# Patient Record
Sex: Female | Born: 1960
Health system: Southern US, Community
[De-identification: ages and names within clinical notes are randomized; demographics above are authoritative.]

## PROBLEM LIST (undated history)

## (undated) DIAGNOSIS — C50919 Malignant neoplasm of unspecified site of unspecified female breast: Secondary | ICD-10-CM

## (undated) DIAGNOSIS — C801 Malignant (primary) neoplasm, unspecified: Secondary | ICD-10-CM

## (undated) DIAGNOSIS — M7060 Trochanteric bursitis, unspecified hip: Secondary | ICD-10-CM

## (undated) DIAGNOSIS — E663 Overweight: Secondary | ICD-10-CM

## (undated) DIAGNOSIS — I10 Essential (primary) hypertension: Secondary | ICD-10-CM

## (undated) DIAGNOSIS — T7840XA Allergy, unspecified, initial encounter: Secondary | ICD-10-CM

## (undated) DIAGNOSIS — E785 Hyperlipidemia, unspecified: Secondary | ICD-10-CM

## (undated) DIAGNOSIS — M79601 Pain in right arm: Secondary | ICD-10-CM

## (undated) DIAGNOSIS — S39012A Strain of muscle, fascia and tendon of lower back, initial encounter: Secondary | ICD-10-CM

## (undated) DIAGNOSIS — M170 Bilateral primary osteoarthritis of knee: Secondary | ICD-10-CM

## (undated) DIAGNOSIS — Z923 Personal history of irradiation: Secondary | ICD-10-CM

## (undated) DIAGNOSIS — IMO0002 Reserved for concepts with insufficient information to code with codable children: Secondary | ICD-10-CM

## (undated) HISTORY — DX: Trochanteric bursitis, unspecified hip: M70.60

## (undated) HISTORY — DX: Bilateral primary osteoarthritis of knee: M17.0

## (undated) HISTORY — PX: ABDOMINAL HYSTERECTOMY: SHX81

## (undated) HISTORY — DX: Allergy, unspecified, initial encounter: T78.40XA

## (undated) HISTORY — DX: Malignant (primary) neoplasm, unspecified: C80.1

## (undated) HISTORY — DX: Strain of muscle, fascia and tendon of lower back, initial encounter: S39.012A

## (undated) HISTORY — DX: Hyperlipidemia, unspecified: E78.5

## (undated) HISTORY — DX: Essential (primary) hypertension: I10

## (undated) HISTORY — DX: Overweight: E66.3

## (undated) HISTORY — PX: OTHER SURGICAL HISTORY: SHX169

## (undated) HISTORY — DX: Pain in right arm: M79.601

## (undated) HISTORY — PX: APPENDECTOMY: SHX54

---

## 2001-08-10 ENCOUNTER — Encounter: Payer: Self-pay | Admitting: Family Medicine

## 2001-08-10 ENCOUNTER — Ambulatory Visit (HOSPITAL_COMMUNITY): Admission: RE | Admit: 2001-08-10 | Discharge: 2001-08-10 | Payer: Self-pay | Admitting: Family Medicine

## 2001-09-22 ENCOUNTER — Other Ambulatory Visit: Admission: RE | Admit: 2001-09-22 | Discharge: 2001-09-22 | Payer: Self-pay | Admitting: Family Medicine

## 2002-07-25 ENCOUNTER — Ambulatory Visit (HOSPITAL_COMMUNITY): Admission: RE | Admit: 2002-07-25 | Discharge: 2002-07-25 | Payer: Self-pay | Admitting: Family Medicine

## 2002-07-25 ENCOUNTER — Encounter: Payer: Self-pay | Admitting: Family Medicine

## 2002-08-21 ENCOUNTER — Encounter: Payer: Self-pay | Admitting: Family Medicine

## 2002-08-21 ENCOUNTER — Ambulatory Visit (HOSPITAL_COMMUNITY): Admission: RE | Admit: 2002-08-21 | Discharge: 2002-08-21 | Payer: Self-pay | Admitting: Nurse Practitioner

## 2002-09-05 ENCOUNTER — Other Ambulatory Visit: Admission: RE | Admit: 2002-09-05 | Discharge: 2002-09-05 | Payer: Self-pay | Admitting: *Deleted

## 2003-05-11 ENCOUNTER — Encounter: Payer: Self-pay | Admitting: Emergency Medicine

## 2003-05-11 ENCOUNTER — Emergency Department (HOSPITAL_COMMUNITY): Admission: EM | Admit: 2003-05-11 | Discharge: 2003-05-11 | Payer: Self-pay | Admitting: Emergency Medicine

## 2003-05-13 ENCOUNTER — Encounter: Payer: Self-pay | Admitting: General Surgery

## 2003-05-13 ENCOUNTER — Ambulatory Visit (HOSPITAL_COMMUNITY): Admission: RE | Admit: 2003-05-13 | Discharge: 2003-05-13 | Payer: Self-pay | Admitting: Family Medicine

## 2003-05-13 ENCOUNTER — Encounter: Payer: Self-pay | Admitting: Family Medicine

## 2003-05-13 ENCOUNTER — Inpatient Hospital Stay (HOSPITAL_COMMUNITY): Admission: AD | Admit: 2003-05-13 | Discharge: 2003-05-16 | Payer: Self-pay | Admitting: General Surgery

## 2003-09-11 ENCOUNTER — Encounter: Payer: Self-pay | Admitting: Family Medicine

## 2003-09-11 ENCOUNTER — Ambulatory Visit (HOSPITAL_COMMUNITY): Admission: RE | Admit: 2003-09-11 | Discharge: 2003-09-11 | Payer: Self-pay | Admitting: Family Medicine

## 2005-02-10 ENCOUNTER — Ambulatory Visit (HOSPITAL_COMMUNITY): Admission: RE | Admit: 2005-02-10 | Discharge: 2005-02-10 | Payer: Self-pay | Admitting: Family Medicine

## 2005-03-03 ENCOUNTER — Ambulatory Visit: Payer: Self-pay | Admitting: Family Medicine

## 2005-03-05 ENCOUNTER — Ambulatory Visit (HOSPITAL_COMMUNITY): Admission: RE | Admit: 2005-03-05 | Discharge: 2005-03-05 | Payer: Self-pay | Admitting: Family Medicine

## 2005-06-01 ENCOUNTER — Encounter (INDEPENDENT_AMBULATORY_CARE_PROVIDER_SITE_OTHER): Payer: Self-pay | Admitting: *Deleted

## 2005-06-01 LAB — CONVERTED CEMR LAB: TSH: 1.734 microintl units/mL

## 2005-06-17 ENCOUNTER — Encounter (INDEPENDENT_AMBULATORY_CARE_PROVIDER_SITE_OTHER): Payer: Self-pay | Admitting: *Deleted

## 2005-08-10 ENCOUNTER — Ambulatory Visit: Payer: Self-pay | Admitting: Family Medicine

## 2005-12-17 ENCOUNTER — Encounter (INDEPENDENT_AMBULATORY_CARE_PROVIDER_SITE_OTHER): Payer: Self-pay | Admitting: *Deleted

## 2006-08-04 ENCOUNTER — Ambulatory Visit: Payer: Self-pay | Admitting: Family Medicine

## 2006-09-15 ENCOUNTER — Ambulatory Visit: Payer: Self-pay | Admitting: Family Medicine

## 2006-12-27 DIAGNOSIS — C50919 Malignant neoplasm of unspecified site of unspecified female breast: Secondary | ICD-10-CM

## 2006-12-27 DIAGNOSIS — IMO0001 Reserved for inherently not codable concepts without codable children: Secondary | ICD-10-CM

## 2006-12-27 DIAGNOSIS — Z923 Personal history of irradiation: Secondary | ICD-10-CM

## 2006-12-27 HISTORY — DX: Malignant neoplasm of unspecified site of unspecified female breast: C50.919

## 2006-12-27 HISTORY — PX: BREAST LUMPECTOMY: SHX2

## 2006-12-27 HISTORY — DX: Personal history of irradiation: Z92.3

## 2006-12-27 HISTORY — DX: Reserved for inherently not codable concepts without codable children: IMO0001

## 2006-12-27 HISTORY — PX: BREAST SURGERY: SHX581

## 2007-04-19 ENCOUNTER — Encounter (INDEPENDENT_AMBULATORY_CARE_PROVIDER_SITE_OTHER): Payer: Self-pay | Admitting: *Deleted

## 2007-04-19 ENCOUNTER — Other Ambulatory Visit: Admission: RE | Admit: 2007-04-19 | Discharge: 2007-04-19 | Payer: Self-pay | Admitting: Family Medicine

## 2007-04-19 ENCOUNTER — Ambulatory Visit: Payer: Self-pay | Admitting: Family Medicine

## 2007-04-19 LAB — CONVERTED CEMR LAB: Pap Smear: NORMAL

## 2007-04-20 ENCOUNTER — Ambulatory Visit (HOSPITAL_COMMUNITY): Admission: RE | Admit: 2007-04-20 | Discharge: 2007-04-20 | Payer: Self-pay | Admitting: Family Medicine

## 2007-05-23 ENCOUNTER — Ambulatory Visit: Payer: Self-pay

## 2007-05-26 ENCOUNTER — Ambulatory Visit: Payer: Self-pay

## 2007-07-19 ENCOUNTER — Ambulatory Visit: Payer: Self-pay | Admitting: General Surgery

## 2007-07-25 ENCOUNTER — Ambulatory Visit: Payer: Self-pay | Admitting: General Surgery

## 2007-07-28 ENCOUNTER — Ambulatory Visit: Payer: Self-pay | Admitting: Radiation Oncology

## 2007-07-31 ENCOUNTER — Ambulatory Visit: Payer: Self-pay | Admitting: General Surgery

## 2007-08-17 ENCOUNTER — Ambulatory Visit: Payer: Self-pay | Admitting: Radiation Oncology

## 2007-08-28 ENCOUNTER — Ambulatory Visit: Payer: Self-pay | Admitting: Radiation Oncology

## 2007-08-28 ENCOUNTER — Ambulatory Visit: Payer: Self-pay | Admitting: Oncology

## 2007-09-27 ENCOUNTER — Ambulatory Visit: Payer: Self-pay | Admitting: Radiation Oncology

## 2007-09-27 ENCOUNTER — Ambulatory Visit: Payer: Self-pay | Admitting: Oncology

## 2007-10-28 ENCOUNTER — Ambulatory Visit: Payer: Self-pay | Admitting: Oncology

## 2007-10-28 ENCOUNTER — Ambulatory Visit: Payer: Self-pay | Admitting: Radiation Oncology

## 2007-10-31 ENCOUNTER — Ambulatory Visit: Payer: Self-pay | Admitting: Family Medicine

## 2007-11-27 ENCOUNTER — Ambulatory Visit: Payer: Self-pay | Admitting: Oncology

## 2007-11-28 ENCOUNTER — Ambulatory Visit: Payer: Self-pay | Admitting: Oncology

## 2007-12-28 ENCOUNTER — Ambulatory Visit: Payer: Self-pay | Admitting: Oncology

## 2008-01-05 ENCOUNTER — Encounter: Payer: Self-pay | Admitting: *Deleted

## 2008-01-05 DIAGNOSIS — I10 Essential (primary) hypertension: Secondary | ICD-10-CM | POA: Insufficient documentation

## 2008-02-25 ENCOUNTER — Ambulatory Visit: Payer: Self-pay | Admitting: Oncology

## 2008-03-27 ENCOUNTER — Ambulatory Visit: Payer: Self-pay | Admitting: Oncology

## 2008-04-04 ENCOUNTER — Other Ambulatory Visit: Admission: RE | Admit: 2008-04-04 | Discharge: 2008-04-04 | Payer: Self-pay | Admitting: Family Medicine

## 2008-04-04 ENCOUNTER — Ambulatory Visit: Payer: Self-pay | Admitting: Family Medicine

## 2008-04-04 ENCOUNTER — Encounter: Payer: Self-pay | Admitting: Family Medicine

## 2008-04-05 ENCOUNTER — Encounter: Payer: Self-pay | Admitting: Family Medicine

## 2008-04-05 LAB — CONVERTED CEMR LAB
Basophils Absolute: 0 10*3/uL (ref 0.0–0.1)
CO2: 22 meq/L (ref 19–32)
Calcium: 9.2 mg/dL (ref 8.4–10.5)
Cholesterol: 180 mg/dL (ref 0–200)
Eosinophils Relative: 1 % (ref 0–5)
HCT: 36.4 % (ref 36.0–46.0)
HDL: 50 mg/dL (ref >39)
LDL Cholesterol: 113 mg/dL — ABNORMAL HIGH (ref 0–99)
Lymphocytes Relative: 21 % (ref 12–46)
Platelets: 342 10*3/uL (ref 150–400)
RDW: 12.8 % (ref 11.5–15.5)
Sodium: 142 meq/L (ref 135–145)
Total CHOL/HDL Ratio: 3.6
VLDL: 17 mg/dL (ref 0–40)
WBC: 6.5 10*3/uL (ref 4.0–10.5)

## 2008-04-12 DIAGNOSIS — M1710 Unilateral primary osteoarthritis, unspecified knee: Secondary | ICD-10-CM

## 2008-04-12 DIAGNOSIS — IMO0002 Reserved for concepts with insufficient information to code with codable children: Secondary | ICD-10-CM | POA: Insufficient documentation

## 2008-04-12 DIAGNOSIS — D0511 Intraductal carcinoma in situ of right breast: Secondary | ICD-10-CM | POA: Insufficient documentation

## 2008-04-24 ENCOUNTER — Ambulatory Visit: Payer: Self-pay | Admitting: Oncology

## 2008-04-26 ENCOUNTER — Ambulatory Visit: Payer: Self-pay | Admitting: Oncology

## 2008-05-27 ENCOUNTER — Ambulatory Visit: Payer: Self-pay | Admitting: Oncology

## 2008-06-05 ENCOUNTER — Emergency Department (HOSPITAL_COMMUNITY): Admission: EM | Admit: 2008-06-05 | Discharge: 2008-06-06 | Payer: Self-pay | Admitting: Emergency Medicine

## 2008-06-06 ENCOUNTER — Ambulatory Visit: Payer: Self-pay | Admitting: Family Medicine

## 2008-06-07 ENCOUNTER — Encounter: Payer: Self-pay | Admitting: Family Medicine

## 2008-06-17 ENCOUNTER — Ambulatory Visit: Payer: Self-pay | Admitting: Oncology

## 2008-06-26 ENCOUNTER — Ambulatory Visit: Payer: Self-pay | Admitting: Oncology

## 2008-07-27 ENCOUNTER — Ambulatory Visit: Payer: Self-pay | Admitting: Oncology

## 2008-08-22 ENCOUNTER — Encounter: Payer: Self-pay | Admitting: Family Medicine

## 2008-08-27 ENCOUNTER — Ambulatory Visit: Payer: Self-pay | Admitting: Oncology

## 2008-09-05 ENCOUNTER — Encounter: Payer: Self-pay | Admitting: Family Medicine

## 2008-09-26 ENCOUNTER — Ambulatory Visit: Payer: Self-pay | Admitting: Oncology

## 2008-10-27 ENCOUNTER — Ambulatory Visit: Payer: Self-pay | Admitting: Oncology

## 2008-11-26 ENCOUNTER — Ambulatory Visit: Payer: Self-pay | Admitting: Oncology

## 2008-12-11 ENCOUNTER — Ambulatory Visit: Payer: Self-pay | Admitting: Oncology

## 2008-12-16 ENCOUNTER — Ambulatory Visit: Payer: Self-pay | Admitting: Oncology

## 2008-12-27 ENCOUNTER — Ambulatory Visit: Payer: Self-pay | Admitting: Oncology

## 2009-01-02 ENCOUNTER — Telehealth: Payer: Self-pay | Admitting: Family Medicine

## 2009-01-30 ENCOUNTER — Ambulatory Visit: Payer: Self-pay | Admitting: Family Medicine

## 2009-01-30 DIAGNOSIS — S335XXA Sprain of ligaments of lumbar spine, initial encounter: Secondary | ICD-10-CM | POA: Insufficient documentation

## 2009-01-30 DIAGNOSIS — E785 Hyperlipidemia, unspecified: Secondary | ICD-10-CM | POA: Insufficient documentation

## 2009-01-30 DIAGNOSIS — J309 Allergic rhinitis, unspecified: Secondary | ICD-10-CM | POA: Insufficient documentation

## 2009-03-31 ENCOUNTER — Encounter: Payer: Self-pay | Admitting: Family Medicine

## 2009-03-31 LAB — CONVERTED CEMR LAB
BUN: 14 mg/dL (ref 6–23)
Basophils Absolute: 0 10*3/uL (ref 0.0–0.1)
Basophils Relative: 1 % (ref 0–1)
CO2: 27 meq/L (ref 19–32)
Cholesterol: 200 mg/dL (ref 0–200)
Eosinophils Relative: 2 % (ref 0–5)
Glucose, Bld: 80 mg/dL (ref 70–99)
HCT: 36.3 % (ref 36.0–46.0)
HDL: 75 mg/dL (ref >39)
Hemoglobin: 11.4 g/dL — ABNORMAL LOW (ref 12.0–15.0)
Lymphocytes Relative: 26 % (ref 12–46)
MCHC: 31.4 g/dL (ref 30.0–36.0)
Monocytes Absolute: 0.5 10*3/uL (ref 0.1–1.0)
Monocytes Relative: 8 % (ref 3–12)
Potassium: 4.1 meq/L (ref 3.5–5.3)
RBC: 4.17 M/uL (ref 3.87–5.11)
RDW: 13.1 % (ref 11.5–15.5)
Sodium: 143 meq/L (ref 135–145)
Total CHOL/HDL Ratio: 2.7
WBC: 5.5 10*3/uL (ref 4.0–10.5)

## 2009-04-15 ENCOUNTER — Encounter: Payer: Self-pay | Admitting: Family Medicine

## 2009-04-15 ENCOUNTER — Ambulatory Visit: Payer: Self-pay | Admitting: Family Medicine

## 2009-04-15 ENCOUNTER — Other Ambulatory Visit: Admission: RE | Admit: 2009-04-15 | Discharge: 2009-04-15 | Payer: Self-pay | Admitting: Family Medicine

## 2009-04-15 LAB — CONVERTED CEMR LAB: OCCULT 1: NEGATIVE

## 2009-04-20 DIAGNOSIS — E663 Overweight: Secondary | ICD-10-CM | POA: Insufficient documentation

## 2009-04-21 ENCOUNTER — Encounter: Payer: Self-pay | Admitting: Family Medicine

## 2009-05-27 ENCOUNTER — Ambulatory Visit: Payer: Self-pay | Admitting: Oncology

## 2009-06-04 ENCOUNTER — Telehealth: Payer: Self-pay | Admitting: Family Medicine

## 2009-06-26 ENCOUNTER — Ambulatory Visit: Payer: Self-pay | Admitting: Oncology

## 2009-08-27 ENCOUNTER — Ambulatory Visit: Payer: Self-pay | Admitting: Oncology

## 2009-09-16 ENCOUNTER — Ambulatory Visit: Payer: Self-pay | Admitting: Oncology

## 2009-09-26 ENCOUNTER — Ambulatory Visit: Payer: Self-pay | Admitting: Oncology

## 2009-10-27 ENCOUNTER — Ambulatory Visit: Payer: Self-pay | Admitting: Oncology

## 2009-11-26 ENCOUNTER — Ambulatory Visit: Payer: Self-pay | Admitting: Oncology

## 2009-12-05 ENCOUNTER — Telehealth: Payer: Self-pay | Admitting: Family Medicine

## 2009-12-16 ENCOUNTER — Ambulatory Visit: Payer: Self-pay | Admitting: Oncology

## 2009-12-27 ENCOUNTER — Ambulatory Visit: Payer: Self-pay | Admitting: Oncology

## 2009-12-28 ENCOUNTER — Encounter: Payer: Self-pay | Admitting: Family Medicine

## 2010-01-27 ENCOUNTER — Ambulatory Visit: Payer: Self-pay | Admitting: Oncology

## 2010-02-02 ENCOUNTER — Ambulatory Visit: Payer: Self-pay | Admitting: Family Medicine

## 2010-02-02 ENCOUNTER — Encounter: Payer: Self-pay | Admitting: Orthopedic Surgery

## 2010-02-02 ENCOUNTER — Ambulatory Visit (HOSPITAL_COMMUNITY): Admission: RE | Admit: 2010-02-02 | Discharge: 2010-02-02 | Payer: Self-pay | Admitting: Family Medicine

## 2010-02-02 DIAGNOSIS — M25529 Pain in unspecified elbow: Secondary | ICD-10-CM | POA: Insufficient documentation

## 2010-02-02 DIAGNOSIS — B029 Zoster without complications: Secondary | ICD-10-CM | POA: Insufficient documentation

## 2010-02-02 DIAGNOSIS — E559 Vitamin D deficiency, unspecified: Secondary | ICD-10-CM | POA: Insufficient documentation

## 2010-02-02 DIAGNOSIS — L259 Unspecified contact dermatitis, unspecified cause: Secondary | ICD-10-CM | POA: Insufficient documentation

## 2010-02-02 DIAGNOSIS — R5383 Other fatigue: Secondary | ICD-10-CM | POA: Insufficient documentation

## 2010-02-02 LAB — CONVERTED CEMR LAB
BUN: 17 mg/dL (ref 6–23)
Basophils Absolute: 0 10*3/uL (ref 0.0–0.1)
Basophils Relative: 1 % (ref 0–1)
CO2: 28 meq/L (ref 19–32)
Calcium: 10.1 mg/dL (ref 8.4–10.5)
Cholesterol: 216 mg/dL — ABNORMAL HIGH (ref 0–200)
Eosinophils Relative: 3 % (ref 0–5)
Glucose, Bld: 84 mg/dL (ref 70–99)
HCT: 37.8 % (ref 36.0–46.0)
Hemoglobin: 11.4 g/dL — ABNORMAL LOW (ref 12.0–15.0)
MCHC: 30.2 g/dL (ref 30.0–36.0)
MCV: 87.9 fL (ref 78.0–100.0)
Monocytes Absolute: 0.5 10*3/uL (ref 0.1–1.0)
Monocytes Relative: 9 % (ref 3–12)
RDW: 13.1 % (ref 11.5–15.5)
TSH: 1.591 microintl units/mL (ref 0.350–4.500)
VLDL: 17 mg/dL (ref 0–40)
Varicella-Zoster Ab, IgM: <0.91 (ref <0.91)
Vit D, 25-Hydroxy: 41 ng/mL (ref 30–89)

## 2010-02-04 ENCOUNTER — Encounter: Payer: Self-pay | Admitting: Family Medicine

## 2010-02-11 ENCOUNTER — Telehealth: Payer: Self-pay | Admitting: Family Medicine

## 2010-02-12 ENCOUNTER — Telehealth: Payer: Self-pay | Admitting: Family Medicine

## 2010-02-17 ENCOUNTER — Ambulatory Visit: Payer: Self-pay | Admitting: Orthopedic Surgery

## 2010-02-17 DIAGNOSIS — M79609 Pain in unspecified limb: Secondary | ICD-10-CM | POA: Insufficient documentation

## 2010-02-17 DIAGNOSIS — M76899 Other specified enthesopathies of unspecified lower limb, excluding foot: Secondary | ICD-10-CM | POA: Insufficient documentation

## 2010-02-18 ENCOUNTER — Encounter: Payer: Self-pay | Admitting: Orthopedic Surgery

## 2010-02-18 ENCOUNTER — Telehealth: Payer: Self-pay | Admitting: Orthopedic Surgery

## 2010-02-23 ENCOUNTER — Telehealth: Payer: Self-pay | Admitting: Orthopedic Surgery

## 2010-02-27 ENCOUNTER — Encounter: Payer: Self-pay | Admitting: Family Medicine

## 2010-05-27 ENCOUNTER — Ambulatory Visit: Payer: Self-pay | Admitting: Oncology

## 2010-06-17 ENCOUNTER — Ambulatory Visit: Payer: Self-pay | Admitting: Oncology

## 2010-06-17 ENCOUNTER — Encounter: Payer: Self-pay | Admitting: Family Medicine

## 2010-06-26 ENCOUNTER — Ambulatory Visit: Payer: Self-pay | Admitting: Oncology

## 2010-07-20 ENCOUNTER — Ambulatory Visit: Payer: Self-pay | Admitting: Family Medicine

## 2010-07-26 DIAGNOSIS — L989 Disorder of the skin and subcutaneous tissue, unspecified: Secondary | ICD-10-CM | POA: Insufficient documentation

## 2011-01-17 ENCOUNTER — Encounter: Payer: Self-pay | Admitting: Family Medicine

## 2011-01-20 ENCOUNTER — Ambulatory Visit: Payer: Self-pay | Admitting: Oncology

## 2011-01-20 ENCOUNTER — Encounter: Payer: Self-pay | Admitting: Family Medicine

## 2011-01-25 ENCOUNTER — Other Ambulatory Visit: Payer: Self-pay | Admitting: Family Medicine

## 2011-01-25 ENCOUNTER — Other Ambulatory Visit (HOSPITAL_COMMUNITY)
Admission: RE | Admit: 2011-01-25 | Discharge: 2011-01-25 | Disposition: A | Discharge status: Home / Self Care | Payer: BC Managed Care – PPO | Source: Ambulatory Visit | Attending: Family Medicine | Admitting: Family Medicine

## 2011-01-25 ENCOUNTER — Ambulatory Visit
Admission: RE | Admit: 2011-01-25 | Discharge: 2011-01-25 | Payer: Self-pay | Source: Home / Self Care | Attending: Family Medicine | Admitting: Family Medicine

## 2011-01-25 DIAGNOSIS — Z01419 Encounter for gynecological examination (general) (routine) without abnormal findings: Secondary | ICD-10-CM | POA: Insufficient documentation

## 2011-01-25 LAB — CONVERTED CEMR LAB: OCCULT 1: NEGATIVE

## 2011-01-26 ENCOUNTER — Encounter: Payer: Self-pay | Admitting: Family Medicine

## 2011-01-26 NOTE — Progress Notes (Signed)
Summary: [facility]  [facility]   Imported By: Lind Guest 01/07/2010 14:02:41  _____________________________________________________________________  External Attachment:    Type:   Image     Comment:   External Document

## 2011-01-26 NOTE — Progress Notes (Signed)
Summary: call  Phone Note Call from Patient   Summary of Call: pt called and saw Amber yesterday. and Amber told pt she needed to speak with dr. Lodema Hong about meds. [phone] Initial call taken by: Rudene Anda,  February 11, 2010 9:50 AM  Follow-up for Phone Call        pls send a note to Amber asking what does she need changed with this pt's medication UI will write it pls fax and let them know on the way. let pt know I am waiting to hear  Follow-up by: Syliva Overman MD,  February 11, 2010 11:57 AM  Additional Follow-up for Phone Call Additional follow up Details #1::        patient aware Additional Follow-up by: Adella Hare LPN,  February 11, 2010 4:25 PM

## 2011-01-26 NOTE — Assessment & Plan Note (Signed)
Summary: OV   Vital Signs:  Patient profile:   50 year old female Menstrual status:  hysterectomy Height:      64.5 inches Weight:      177.75 pounds BMI:     30.15 O2 Sat:      98 % Pulse rate:   91 / minute Pulse rhythm:   regular Resp:     16 per minute BP sitting:   122 / 82  Vitals Entered By: Everitt Amber (February 02, 2010 7:59 AM)  Nutrition Counseling: Patient's BMI is greater than 25 and therefore counseled on weight management options. CC: Follow up chronic problems   CC:  Follow up chronic problems.  History of Present Illness: Reports  that she has been doiing   well. Denies recent fever or chills. Denies sinus pressure, nasal congestion , ear pain or sore throat. Denies chest congestion, or cough productive of sputum. Denies chest pain, palpitations, PND, orthopnea or leg swelling. Denies abdominal pain, nausea, vomitting, diarrhea or constipation. Denies change in bowel movements or bloody stool. Denies dysuria , frequency, incontinence or hesitancy. Reports joint pain, and reduced mobility. Denies headaches, vertigo, seizures. Denies depression, anxiety or insomnia. reports   rash, lesions, and itch.     Current Medications (verified): 1)  Nexium 40 Mg  Cpdr (Esomeprazole Magnesium) .... One Cap By Mouth Once Daily 2)  Lotensin Hct 20-12.5 Mg Tabs (Benazepril-Hydrochlorothiazide) .... Take 1 Tablet By Mouth Once A Day 3)  Femara 2.5 Mg Tabs (Letrozole) .... Take 1 Tablet By Mouth Once A Day 4)  Singulair 10 Mg Tabs (Montelukast Sodium) .... Take 1 Tablet By Mouth Once A Day 5)  Tandem 162-115.2 Mg Caps (Ferrous Fum-Iron Polysacch) .... Take 1 Tablet By Mouth Two Times A Day  Allergies (verified): No Known Drug Allergies  Review of Systems      See HPI Eyes:  Denies blurring and discharge. MS:  Complains of joint pain; right elbow pain progressing over the past 4 months, no numbness or weakness of extremity, but at times so painful she has to lift  the arm. Derm:  Complains of itching and rash; burning stinging rash on left side of neck x 1 nmonth. Endo:  Denies cold intolerance, excessive hunger, excessive thirst, excessive urination, heat intolerance, polyuria, and weight change. Heme:  Denies abnormal bruising and bleeding. Allergy:  Denies hives or rash and sneezing.  Physical Exam  General:  Well-developed,well-nourished,in no acute distress; alert,appropriate and cooperative throughout examination HEENT: No facial asymmetry,  EOMI, No sinus tenderness, TM's Clear, oropharynx  pink and moist.   Chest: Clear to auscultation bilaterally.  CVS: S1, S2, No murmurs, No S3.   Abd: Soft, Nontender.  MS: Adequate ROM spine, hips, shoulders and knees. Reduced mobility in right elbow which is tender Ext: No edema.   CNS: CN 2-12 intact, power tone and sensation normal throughout.   Skin: Intact,hyperpigmeented lesions with vesicles on left side of neck Psych: Good eye contact, normal affect.  Memory intact, not anxious or depressed appearing.    Impression & Recommendations:  Problem # 1:  OVERWEIGHT (ICD-278.02) Assessment Deteriorated  Ht: 64.5 (02/02/2010)   Wt: 177.75 (02/02/2010)   BMI: 30.15 (02/02/2010)  Problem # 2:  HYPERTENSION (ICD-401.9) Assessment: Unchanged  Her updated medication list for this problem includes:    Lotensin Hct 20-12.5 Mg Tabs (Benazepril-hydrochlorothiazide) .Marland Kitchen... Take 1 tablet by mouth once a day  BP today: 122/82 Prior BP: 120/80 (01/30/2009)  Labs Reviewed: K+: 4.1 (03/31/2009) Creat: :  0.75 (03/31/2009)   Chol: 200 (03/31/2009)   HDL: 75 (03/31/2009)   LDL: 110 (03/31/2009)   TG: 73 (03/31/2009)  Orders: T-Basic Metabolic Panel [phone])  Problem # 3:  DERMATITIS (ICD-692.9)  appears to be zoster  Her updated medication list for this problem includes:    Prednisone (pak) 5 Mg Tabs (Prednisone) ..... Use as directed  Orders: Dermatology Referral (Derma)  Complete  Medication List: 1)  Nexium 40 Mg Cpdr (Esomeprazole magnesium) .... One cap by mouth once daily 2)  Lotensin Hct 20-12.5 Mg Tabs (Benazepril-hydrochlorothiazide) .... Take 1 tablet by mouth once a day 3)  Femara 2.5 Mg Tabs (Letrozole) .... Take 1 tablet by mouth once a day 4)  Singulair 10 Mg Tabs (Montelukast sodium) .... Take 1 tablet by mouth once a day 5)  Tandem 162-115.2 Mg Caps (Ferrous fum-iron polysacch) .... Take 1 tablet by mouth two times a day 6)  Acyclovir 800 Mg Tabs (Acyclovir) .... One tablet 5 times daily 7)  Prednisone (pak) 5 Mg Tabs (Prednisone) .... Use as directed 8)  Ibuprofen 800 Mg Tabs (Ibuprofen) .... Take 1 tablet by mouth three times a day  Other Orders: Ophthalmology Referral (Ophthalmology) Radiology other (Radiology Other) Orthopedic Referral (Ortho) T-Lipid Profile [id number]) T-TSH [phone]) T-CBC w/Diff [phone]) T-Vitamin D (25-Hydroxy) [phone]) T-Varicella-Zoster Antibody IgM ([id number])  Patient Instructions: 1)  F/u in 5.5 months. 2)  It is important that you exercise regularly at least 20 minutes 5 times a week. If you develop chest pain, have severe difficulty breathing, or feel very tired , stop exercising immediately and seek medical attention. 3)  You need to lose weight. Consider a lower calorie diet and regular exercise. Goal is 10 pounds or more. 4)  BMP prior to visit, ICD-9: 5)  Lipid Panel prior to visit, ICD-9: 6)  TSH prior to visit, ICD-9:   afsting today. 7)  hSV titer  dx shingles 8)  CBC w/ Diff prior to visit, ICD-9: 9)  xray of right elbow today. 10)  You will be referred to dermatology, ortho , opthalmology 11)  injections in the office and meds are sent 12)  Vit D level Prescriptions: SINGULAIR 10 MG TABS (MONTELUKAST SODIUM) Take 1 tablet by mouth once a day  #30 x 5   Entered by:   Everitt Amber   Authorized by:   Syliva Overman MD   Signed by:   Everitt Amber on 02/02/2010   Method used:    Electronically to        [facility]  [address] (retail)       [address]       [place], [place]  [address]       Ph: [phone]       Fax: [phone]   RxID:   [id number] NEXIUM 40 MG  CPDR (ESOMEPRAZOLE MAGNESIUM) one cap by mouth once daily  #30 x 5   Entered by:   Everitt Amber   Authorized by:   Syliva Overman MD   Signed by:   Everitt Amber on 02/02/2010   Method used:   Electronically to        [facility]  [address] (retail)       [address]       [place], [place]  [address]       Ph: [phone]       Fax: [phone]   RxID:  [id number] IBUPROFEN 800 MG TABS (IBUPROFEN) Take 1 tablet by mouth three times a day  #30 x 0   Entered and Authorized by:   Syliva Overman MD   Signed by:   Syliva Overman MD on 02/02/2010   Method used:   Electronically to        [facility]  [address] (retail)       [address]       [place], [place]  [address]       Ph: [phone]       Fax: [phone]   RxID:   [id number] PREDNISONE (PAK) 5 MG TABS (PREDNISONE) Use as directed  #21 x 0   Entered and Authorized by:   Syliva Overman MD   Signed by:   Syliva Overman MD on 02/02/2010   Method used:   Electronically to        [facility]  [address] [address] [facility] (retail)       [address]       [place], [place]  [address]       Ph: [phone]       Fax: [phone]   RxID:   [id number] ACYCLOVIR 800 MG TABS (ACYCLOVIR) one tablet 5 times daily  #50 x 0   Entered and Authorized by:   Syliva Overman MD   Signed by:   Syliva Overman MD on 02/02/2010   Method used:   Electronically to        [facility]  [address] [facility] (retail)       [address]       [place], [place]  [address]       Ph: [phone]       Fax: [phone]   RxID:   [id number]   Appended Document: OV   Medication Administration  Injection # 1:    Medication: Depo- Medrol 80mg     Diagnosis: ELBOW PAIN,  RIGHT (ICD-719.42)    Route: IM    Site: RUOQ gluteus    Exp Date: 09/2010    Lot #: [id number]    Mfr: Pharmacia    Comments: 80 mg given     Patient tolerated injection without complications    Given by: Everitt Amber (February 02, 2010 10:06 AM)  Injection # 2:    Medication: Ketorolac-Toradol 15mg     Diagnosis: ELBOW PAIN, RIGHT (ICD-719.42)    Route: IM    Site: LUOQ gluteus    Exp Date: 08/2011    Lot #: [id number]    Mfr: novaplus    Comments: 60 mg given     Patient tolerated injection without complications    Given by: Everitt Amber (February 02, 2010 10:08 AM)  Orders Added: 1)  Ketorolac-Toradol 15mg  [J1885] 2)  Admin of Therapeutic Inj  intramuscular or subcutaneous [[id number]] 3)  Depo- Medrol 80mg  [J1040]

## 2011-01-26 NOTE — Progress Notes (Signed)
Summary: DERMATOLOGY  DERMATOLOGY   Imported By: Lind Guest 03/10/2010 15:49:16  _____________________________________________________________________  External Attachment:    Type:   Image     Comment:   External Document

## 2011-01-26 NOTE — Letter (Signed)
Summary: Letter  Letter   Imported By: Lind Guest 02/05/2010 13:35:52  _____________________________________________________________________  External Attachment:    Type:   Image     Comment:   External Document

## 2011-01-26 NOTE — Letter (Signed)
Summary: *Orthopedic Consult Note  [facility]  [address]  [place], [place] [address]   Phone: [phone]  Fax: [phone]    Re:    Paula Randolph DOB:    1961-10-14   Dear: Claris Che   Thank you for requesting that we see the above patient for consultation.  A copy of the detailed office note will be sent under separate cover, for your review.  I could find no problems with her arm.  She does not have rotator cuff disease, tennis elbow, golfer's elbow, cubital tunnel syndrome or ulnar artery syndrome.  She is not have carpal tunnel syndrome from what I could tell.  Recommend further followup with neurology.      Thank you for this opportunity to look after your patient.  Sincerely,   Terrance Mass. MD.

## 2011-01-26 NOTE — Assessment & Plan Note (Signed)
Summary: office visit   Vital Signs:  Patient profile:   50 year old female Menstrual status:  hysterectomy Height:      64.5 inches Weight:      175.75 pounds BMI:     29.81 O2 Sat:      98 % Pulse rate:   87 / minute Pulse rhythm:   regular Resp:     16 per minute BP sitting:   120 / 80  (left arm) Cuff size:   large  Vitals Entered By: Everitt Amber LPN (July 20, 2010 3:34 PM)  Nutrition Counseling: Patient's BMI is greater than 25 and therefore counseled on weight management options. CC: Follow up chronic problems   Primary Care Provider:  Syliva Overman MD  CC:  Follow up chronic problems.  History of Present Illness: Reports  that she has been  doing well. Denies recent fever or chills. Denies sinus pressure, nasal congestion , ear pain or sore throat. Denies chest congestion, or cough productive of sputum. Denies chest pain, palpitations, PND, orthopnea or leg swelling. Denies abdominal pain, nausea, vomitting, diarrhea or constipation.  Denies change in bowel movements or bloody stool. Denies dysuria , frequency, incontinence or hesitancy. Denies  joint pain, swelling, or reduced mobility. Denies headaches, vertigo, seizures. Denies depression, anxiety or insomnia.    Current Medications (verified): 1)  Nexium 40 Mg  Cpdr (Esomeprazole Magnesium) .... One Cap By Mouth Once Daily 2)  Femara 2.5 Mg Tabs (Letrozole) .... Take 1 Tablet By Mouth Once A Day 3)  Singulair 10 Mg Tabs (Montelukast Sodium) .... Take 1 Tablet By Mouth Once A Day 4)  Tandem 162-115.2 Mg Caps (Ferrous Fum-Iron Polysacch) .... Take 1 Tablet By Mouth Two Times A Day 5)  Lotensin 10 Mg Tabs (Benazepril Hcl) .... 3 Tablets Once Daily 6)  Vitamin D 1000 Unit Tabs (Cholecalciferol) .... 2 Tabs Once Daily  Allergies (verified): No Known Drug Allergies  Past History:  Past medical, surgical, family and social histories (including risk factors) reviewed for relevance to current acute and  chronic problems.  Past Medical History: Reviewed history from 01/05/2008 and no changes required. Current Problems:  OBESITY, MILD (ICD-278.02) OSTEOARTHRITIS, KNEES, BILATERAL (ICD-715.96) NEOPLASM, MALIGNANT, BREAST, RIGHT (ICD-174.9) HYPERTENSION (ICD-401.9)  Past Surgical History: Reviewed history from 01/30/2009 and no changes required. BTL (1989) Cholecystectomy (2004) Partial right mastectomy (2008) TAH and BSO   08/09  benign  Family History: Reviewed history from 02/17/2010 and no changes required. Mother deceased - breast cancer Father deceased - diabetic 4 sisters - healthy 1 brother - DM FH of Cancer:  Family History of Diabetes  Social History: Reviewed history from 02/17/2010 and no changes required. 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 Eyes:  Denies blurring and discharge. Derm:  Complains of lesion(s); hyperpigmented lesion on left neck pesits nd has worsened over the pas 3 months, despite change in bp medi. Endo:  Denies cold intolerance, excessive hunger, excessive thirst, excessive urination, heat intolerance, polyuria, and weight change. Heme:  Denies abnormal bruising and bleeding. Allergy:  Denies hives or rash and itching eyes.  Physical Exam  General:  Well-developed,well-nourished,in no acute distress; alert,appropriate and cooperative throughout examination HEENT: No facial asymmetry,  EOMI, No sinus tenderness, TM's Clear, oropharynx  pink and moist.   Chest: Clear to auscultation bilaterally.  CVS: S1, S2, No murmurs, No S3.  Abd: Soft, Nontender.  MS: Adequate ROM spine, hips, shoulders and knees. Reduced mobility in right elbow which is tender Ext: No edema.   CNS: CN 2-12 intact, power tone and sensation normal throughout.   Skin: Intact,hyperpigmeented lesions s on left side of neck Psych: Good eye  contact, normal affect.  Memory intact, not anxious or depressed appearing.    Impression & Recommendations:  Problem # 1:  OVERWEIGHT (ICD-278.02) Assessment Unchanged  Ht: 64.5 (07/20/2010)   Wt: 175.75 (07/20/2010)   BMI: 29.81 (07/20/2010)  Problem # 2:  DYSLIPIDEMIA (ICD-272.4) Assessment: Comment Only    HDL:72 (02/02/2010), 75 (03/31/2009)  LDL:127 (02/02/2010), 110 (03/31/2009)  Chol:216 (02/02/2010), 200 (03/31/2009)  Trig:83 (02/02/2010), 73 (03/31/2009) low fat diet discusswed and encouraged  Problem # 3:  HYPERTENSION (ICD-401.9) Assessment: Unchanged  Her updated medication list for this problem includes:    Lotensin 10 Mg Tabs (Benazepril hcl) .Marland KitchenMarland KitchenMarland KitchenMarland Kitchen 3 tablets once daily  BP today: 120/80 Prior BP: 122/82 (02/02/2010)  Labs Reviewed: K+: 4.3 (02/02/2010) Creat: : 0.71 (02/02/2010)   Chol: 216 (02/02/2010)   HDL: 72 (02/02/2010)   LDL: 127 (02/02/2010)   TG: 83 (02/02/2010)  Problem # 4:  SKIN LESION (ICD-709.9) Assessment: Deteriorated derm to re-eval  Complete Medication List: 1)  Nexium 40 Mg Cpdr (Esomeprazole magnesium) .... One cap by mouth once daily 2)  Femara 2.5 Mg Tabs (Letrozole) .... Take 1 tablet by mouth once a day 3)  Singulair 10 Mg Tabs (Montelukast sodium) .... Take 1 tablet by mouth once a day 4)  Tandem 162-115.2 Mg Caps (Ferrous fum-iron polysacch) .... Take 1 tablet by mouth two times a day 5)  Lotensin 10 Mg Tabs (Benazepril hcl) .... 3 tablets once daily 6)  Vitamin D 1000 Unit Tabs (Cholecalciferol) .... 2 tabs once daily  Other Orders: Dermatology Referral (Derma)  Patient Instructions: 1)  CPE in 3 to 4 months. 2)  BP is great, no med changes at this time. 3)  You wol be referred  back to derm asbout your skin 4)  I am glad that you are doing so well, pls start reg exercise and cut back on fried and fatty foods.

## 2011-01-26 NOTE — Progress Notes (Signed)
[facility]   Imported By: Lind Guest 08/10/2010 09:50:34  _____________________________________________________________________  External Attachment:    Type:   Image     Comment:   External Document

## 2011-01-26 NOTE — Progress Notes (Signed)
Summary: call from Dr Ronal Fear office missed appointment  Phone Note From Other Clinic   Caller: Referral Coordinator Summary of Call: Dawn from Dr Ronal Fear office called to relay that referred patient Ms. Esquivias was a no show for her consult appointment today. Initial call taken by: Cammie Sickle,  February 23, 2010 6:19 PM  Follow-up for Phone Call        ok Follow-up by: Ether Griffins,  February 24, 2010 1:18 PM

## 2011-01-26 NOTE — Letter (Signed)
Summary: History form  History form   Imported By: Jacklynn Ganong 02/20/2010 08:49:09  _____________________________________________________________________  External Attachment:    Type:   Image     Comment:   External Document

## 2011-01-26 NOTE — Progress Notes (Signed)
Summary: Referral to Dr. Gerilyn Pilgrim.  Phone Note Outgoing Call   Call placed by: Waldon Reining,  February 18, 2010 4:31 PM Call placed to: Specialist Action Taken: Information Sent Summary of Call: I faxed a referral for this patient to Dr. Gerilyn Pilgrim to be seen for right arm pain and weakness.

## 2011-01-26 NOTE — Progress Notes (Signed)
  Phone Note Other Incoming   Caller: amber , pa Summary of Call: states lichenoid drug erruption, likely due to hctz or any ppi will d/c both if possible Initial call taken by: Syliva Overman MD,  February 12, 2010 9:40 AM  Follow-up for Phone Call        let pt know derm called I would like to change her BP med first, new to be faxed in as this may be the cause of the skin lesion Follow-up by: Syliva Overman MD,  February 12, 2010 4:59 PM  Additional Follow-up for Phone Call Additional follow up Details #1::        rx sent, patient aware Additional Follow-up by: Adella Hare LPN,  February 12, 2010 5:01 PM    New/Updated Medications: LOTENSIN 10 MG TABS (BENAZEPRIL HCL) 3 tablets once daily Prescriptions: LOTENSIN 10 MG TABS (BENAZEPRIL HCL) 3 tablets once daily  #90 x 3   Entered and Authorized by:   Syliva Overman MD   Signed by:   Syliva Overman MD on 02/12/2010   Method used:   Printed then faxed to ...       [facility]  [address] [facility] (retail)       [address]       [place], [place]  [address]       Ph: [phone]       Fax: [phone]   RxID:   [id number]

## 2011-01-26 NOTE — Assessment & Plan Note (Signed)
Summary: RT ELBOW PAIN/NEEDS XRAY/REF [facility]   Vital Signs:  Patient profile:   50 year old female Menstrual status:  hysterectomy Weight:      174 pounds Pulse rate:   78 / minute Resp:     16 per minute  Vitals Entered By: Fuller Canada MD (February 17, 2010 1:49 PM)  Visit Type:  Initial Consult Referring Provider:  Dr. Lodema Hong Primary Provider:  Syliva Overman MD  CC:  right elbow pain.Marland Kitchen  History of Present Illness: Our patient today presents with pain in the anterior portion of her RIGHT arm which radiates from the biceps area across the elbow and into the forearm.  She denies any injury.  Patient has had pain for 4 months.  The symptoms are intermittent severe associated with sharp pain worse in the morning and worse at night.  The onset was sudden but she denies numbness or tingling.  Denies locking catching swelling or bruising.  She denies shoulder pain denies neck pain.  Meds: Femara, Lotensin, Tandem, Vitamin D plus Calcium.  Xrays [facility] 02/02/10 right elbowcolon these films show no abnormality 4 views were included, the report was reviewed as well.     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 Cardiac :  Denies chest pain, angina, heart attack, heart failure, poor circulation, blood clots, and phlebitis. Resp:  Denies short of breath, difficulty breathing, COPD, cough, and pneumonia. GI:  Denies nausea, vomiting, diarrhea, constipation, difficulty swallowing, ulcers, GERD, and reflux. GU:  Denies kidney failure, kidney transplant, kidney stones, burning, poor stream, testicular  cancer, blood in urine, and . Neuro:  Denies headache, dizziness, migraines, numbness, weakness, tremor, and unsteady walking. MS:  Denies joint pain, rheumatoid arthritis, joint swelling, gout, bone cancer, osteoporosis, and . Endo:  Denies thyroid disease, goiter, and diabetes. Psych:  Denies depression, mood swings, anxiety, panic attack, bipolar, and schizophrenia. Derm:  Denies eczema, cancer, and itching. EENT:  Denies poor vision, cataracts, glaucoma, poor hearing, vertigo, ears ringing, sinusitis, hoarseness, toothaches, and bleeding gums. Immunology:  Denies seasonal allergies, sinus problems, and allergic to bee stings. Lymphatic:  Denies lymph node cancer and lymph edema.  Physical Exam  Additional Exam:   VS reviewed and were normal  GEN: appearance was normal   CDV: normal pulses temperature and no edema  LYMPH nodes were normal   SKIN was normal   Neuro: normal sensation Psyche: AAO x 3 and mood was normal   MSK *Gait was normal   RIGHT arm, including shoulder, elbow wrist and hand *Inspection normal *ROM, normal *Motor, normal *Stability, normal   Spurling sign was negative.  Impingement sign was negative.  Apprehension sign negative.  Tinel's over the ulnar nerve at the elbow negative.  Tennis elbow test negative.  Carpal tunnel testing negative.    Impression & Recommendations:  Problem # 1:  ARM PAIN, RIGHT (ICD-729.5) Assessment New She ruled out for rotator cuff syndrome, tennis elbow, golfer's elbow, cubital tunnel syndrome, ulnar artery syndrome.  I do not find anything wrong with her arm.  She should have a further workup with a neurologist.   Orders: Neurology Referral (Neuro) New Patient Level III [phone])  Patient Instructions: 1)  Neurology referral

## 2011-01-27 ENCOUNTER — Ambulatory Visit: Payer: Self-pay | Admitting: Oncology

## 2011-01-28 ENCOUNTER — Encounter: Payer: Self-pay | Admitting: Family Medicine

## 2011-02-03 NOTE — Letter (Signed)
Summary: Letter  Letter   Imported By: Lind Guest 01/29/2011 09:18:09  _____________________________________________________________________  External Attachment:    Type:   Image     Comment:   External Document

## 2011-02-03 NOTE — Letter (Signed)
Summary: [facility]  [facility]   Imported By: Lind Guest 01/26/2011 13:08:36  _____________________________________________________________________  External Attachment:    Type:   Image     Comment:   External Document

## 2011-02-03 NOTE — Letter (Signed)
Summary: routine wellness visit paper  routine wellness visit paper   Imported By: Lind Guest 01/26/2011 13:07:32  _____________________________________________________________________  External Attachment:    Type:   Image     Comment:   External Document

## 2011-02-03 NOTE — Letter (Signed)
Summary: [facility]   Imported By: Lind Guest 01/26/2011 14:39:48  _____________________________________________________________________  External Attachment:    Type:   Image     Comment:   External Document

## 2011-02-03 NOTE — Assessment & Plan Note (Signed)
Summary: office visit   Vital Signs:  Patient profile:   50 year old female Menstrual status:  hysterectomy Height:      64.5 inches Weight:      176 pounds BMI:     29.85 O2 Sat:      99 % Pulse rate:   86 / minute Pulse rhythm:   regular Resp:     16 per minute BP sitting:   120 / 82  (left arm) Cuff size:   large  Vitals Entered By: Everitt Amber LPN (January 25, 2011 4:16 PM)  Nutrition Counseling: Patient's BMI is greater than 25 and therefore counseled on weight management options. CC: CPE   Vision Screening:Left eye w/o correction: 20 / 20 Right Eye w/o correction: 20 / 25 Both eyes w/o correction:  20/ 20  Color vision testing: normal      Vision Entered By: Everitt Amber LPN (January 25, 2011 4:24 PM)   Primary Care Provider:  Syliva Overman MD  CC:  CPE .  History of Present Illness: Reports  that tshe is  doing well. She is here for her annual exam, and has nio concerns. Denies recent fever or chills. Denies sinus pressure, nasal congestion , ear pain or sore throat. Denies chest congestion, or cough productive of sputum. Denies chest pain, palpitations, PND, orthopnea or leg swelling. Denies abdominal pain, nausea, vomitting, diarrhea or constipation. Denies change in bowel movements or bloody stool. Denies dysuria , frequency, incontinence or hesitancy. Denies  joint pain, swelling, or reduced mobility. Denies headaches, vertigo, seizures. Denies depression, anxiety or insomnia. Denies  rash, lesions, or itch. She is involved in regular physical activity,  Her mamogram is up to date     Current Medications (verified): 1)  Nexium 40 Mg  Cpdr (Esomeprazole Magnesium) .... One Cap By Mouth Once Daily 2)  Femara 2.5 Mg Tabs (Letrozole) .... Take 1 Tablet By Mouth Once A Day 3)  Singulair 10 Mg Tabs (Montelukast Sodium) .... Take 1 Tablet By Mouth Once A Day 4)  Tandem 162-115.2 Mg Caps (Ferrous Fum-Iron Polysacch) .... Take 1 Tablet By Mouth Two  Times A Day 5)  Lotensin 10 Mg Tabs (Benazepril Hcl) .... 3 Tablets Once Daily 6)  Vitamin D 1000 Unit Tabs (Cholecalciferol) .... 2 Tabs Once Daily 7)  Calcium 600 Mg Tabs (Calcium) .... 3 Tabs Daily 8)  Fish Oil 1200 Mg Caps (Omega-3 Fatty Acids) .... 2 Tabs Daily  Allergies (verified): No Known Drug Allergies  Past History:  Past medical history reviewed for relevance to current acute and chronic problems. Past surgical history reviewed for relevance to current acute and chronic problems. Family history reviewed for relevance to current acute and chronic problems. Social history (including risk factors) reviewed for relevance to current acute and chronic problems.  Past Medical History: Reviewed history from 01/05/2008 and no changes required. Current Problems:  OBESITY, MILD (ICD-278.02) OSTEOARTHRITIS, KNEES, BILATERAL (ICD-715.96) NEOPLASM, MALIGNANT, BREAST, RIGHT (ICD-174.9) HYPERTENSION (ICD-401.9)  Past Surgical History: Reviewed history from 01/30/2009 and no changes required. BTL (1989) Cholecystectomy (2004) Partial right mastectomy (2008) TAH and BSO   08/09  benign  Family History: Reviewed history from 02/17/2010 and no changes required. Mother deceased - breast cancer Father deceased - diabetic 4 sisters - healthy 1 brother - DM FH of Cancer:  Family History of Diabetes  Social History: Reviewed history from 02/17/2010 and no changes required. 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 Endo:  Denies cold intolerance, excessive hunger, excessive thirst, excessive urination, and heat intolerance. Heme:  Denies abnormal bruising and bleeding. Allergy:  Denies hives or rash and itching eyes.  Physical Exam  General:  Well-developed,well-nourished,in no acute distress; alert,appropriate and cooperative throughout  examination Head:  Normocephalic and atraumatic without obvious abnormalities. No apparent alopecia or balding. Eyes:  No corneal or conjunctival inflammation noted. EOMI. Perrla. Funduscopic exam benign, without hemorrhages, exudates or papilledema. Vision grossly normal. Ears:  External ear exam shows no significant lesions or deformities.  Otoscopic examination reveals clear canals, tympanic membranes are intact bilaterally without bulging, retraction, inflammation or discharge. Hearing is grossly normal bilaterally. Nose:  External nasal examination shows no deformity or inflammation. Nasal mucosa are pink and moist without lesions or exudates. Mouth:  Oral mucosa and oropharynx without lesions or exudates.  Teeth in good repair. Neck:  No deformities, masses, or tenderness noted. Chest Wall:  No deformities, masses, or tenderness noted. Breasts:  left breast normal. Rightpartial mastectomy. No nipple d/c . No axillary or supraclavicular adenopathy Lungs:  Normal respiratory effort, chest expands symmetrically. Lungs are clear to auscultation, no crackles or wheezes. Heart:  Normal rate and regular rhythm. S1 and S2 normal without gallop, murmur, click, rub or other extra sounds. Abdomen:  Bowel sounds positive,abdomen soft and non-tender without masses, organomegaly or hernias noted. Rectal:  No external abnormalities noted. Normal sphincter tone. No rectal masses or tenderness. Genitalia:  normal introitus, no external lesions, mucosa pink and moist, and no adnexal masses or tenderness.  Uterus is absent  Msk:  No deformity or scoliosis noted of thoracic or lumbar spine.   Pulses:  R and L carotid,radial,femoral,dorsalis pedis and posterior tibial pulses are full and equal bilaterally Extremities:  No clubbing, cyanosis, edema, or deformity noted with normal full range of motion of all joints.   Neurologic:  No cranial nerve deficits noted. Station and gait are normal. Plantar reflexes are  down-going bilaterally. DTRs are symmetrical throughout. Sensory, motor and coordinative functions appear intact. Skin:  Intact without suspicious lesions or rashes Cervical Nodes:  No lymphadenopathy noted Axillary Nodes:  No palpable lymphadenopathy Inguinal Nodes:  No significant adenopathy Psych:  Cognition and judgment appear intact. Alert and cooperative with normal attention span and concentration. No apparent delusions, illusions, hallucinations   Impression & Recommendations:  Problem # 1:  PHYSICAL EXAMINATION (ICD-V70.0) Assessment Comment Only pap sent. Rectal exam reveals guaic neg stool. Pt encouraged to commit to daily physical activity and healthy eating  Problem # 2:  DYSLIPIDEMIA (ICD-272.4) Assessment: Comment Only  Orders: T-Lipid Profile ([id number]) Low fat dietdiscussed and encouraged    HDL:72 (02/02/2010), 75 (03/31/2009)  LDL:127 (02/02/2010), 110 (03/31/2009)  Chol:216 (02/02/2010), 200 (03/31/2009)  Trig:83 (02/02/2010), 73 (03/31/2009)  Problem # 3:  OVERWEIGHT (ICD-278.02) Assessment: Unchanged  Ht: 64.5 (01/25/2011)   Wt: 176 (01/25/2011)   BMI: 29.85 (01/25/2011) therapeutic lifestyle change discussed and encouraged  Problem # 4:  HYPERTENSION (ICD-401.9) Assessment: Unchanged  Her updated medication list for this problem includes:    Lotensin 10 Mg Tabs (Benazepril hcl) .Marland KitchenMarland KitchenMarland KitchenMarland Kitchen 3 tablets once daily  Orders: T-Basic Metabolic Panel [phone])  BP today: 120/82 Prior BP: 120/80 (07/20/2010)  Labs Reviewed: K+: 4.3 (02/02/2010) Creat: : 0.71 (02/02/2010)   Chol: 216 (02/02/2010)   HDL: 72 (02/02/2010)   LDL: 127 (02/02/2010)   TG: 83 (02/02/2010)  Complete Medication List: 1)  Nexium 40 Mg  Cpdr (Esomeprazole magnesium) .... One cap by mouth once daily 2)  Femara 2.5 Mg Tabs (Letrozole) .... Take 1 tablet by mouth once a day 3)  Singulair 10 Mg Tabs (Montelukast sodium) .... Take 1 tablet by mouth once a day 4)  Tandem 162-115.2 Mg  Caps (Ferrous fum-iron polysacch) .... Take 1 tablet by mouth two times a day 5)  Lotensin 10 Mg Tabs (Benazepril hcl) .... 3 tablets once daily 6)  Vitamin D 1000 Unit Tabs (Cholecalciferol) .... 2 tabs once daily 7)  Calcium 600 Mg Tabs (Calcium) .... 3 tabs daily 8)  Fish Oil 1200 Mg Caps (Omega-3 fatty acids) .... 2 tabs daily  Other Orders: Hemoccult Guaiac-1 spec.(in office) (16109) Pap Smear (60454) T-CBC w/Diff (09811-91478) T-TSH [id number]) T-Vitamin D (25-Hydroxy) [phone])  Patient Instructions: 1)  Follow up appointment in 5.56months 2)  It is important that you exercise regularly at least 30 minutes 5 times a week. If you develop chest pain, have severe difficulty breathing, or feel very tired , stop exercising immediately and seek medical attention. 3)  You need to lose weight. Consider a lower calorie diet and regular exercise.  4)  BMP prior to visit, ICD-9: 5)  Lipid Panel prior to visit, ICD-9: 6)  TSH prior to visit, ICD-9:  fasting feb 7 or after 7)  CBC w/ Diff prior to visit, ICD-9:, Vit D 8)  no med chhanges at this time. 9)  We will provide a 1500 cal diet sheet Prescriptions: LOTENSIN 10 MG TABS (BENAZEPRIL HCL) 3 tablets once daily  #90 x 3   Entered by:   Adella Hare LPN   Authorized by:   Syliva Overman MD   Signed by:   Adella Hare LPN on 28/41/3244   Method used:   Electronically to        [facility]  [address] (retail)       [address]       [place], [place]  [address]       Ph: [phone]       Fax: [phone]   RxID:   [id number] NEXIUM 40 MG  CPDR (ESOMEPRAZOLE MAGNESIUM) one cap by mouth once daily  #30 x 5   Entered by:   Adella Hare LPN   Authorized by:   Syliva Overman MD   Signed by:   Adella Hare LPN on 63/12/6008   Method used:   Electronically to        [facility]  [address] (retail)       [address]       [place], [place]  [address]       Ph: [phone]       Fax:  [phone]   RxID:   [id number]    Orders Added: 1)  Est. Patient 40-64 years [99396] 2)  Hemoccult Guaiac-1 spec.(in office) [82270] 3)  Pap Smear [88150] 4)  T-Basic Metabolic Panel [80048-22910] 5)  T-Lipid Profile [80061-22930] 6)  T-CBC w/Diff [94854-62703] 7)  T-TSH [50093-81829] 8)  T-Vitamin D (25-Hydroxy) [93716-96789]    Laboratory Results  Date/Time Received: January 25, 2011 4:55 PM  Date/Time Reported: January 25, 2011 4:55 PM   Stool - Occult Blood Hemmoccult #1: negative Date: 01/25/2011 Comments: 5030 5/14 51301 13L 10/13 Adella Hare LPN  January 25, 2011 4:56 PM

## 2011-02-10 ENCOUNTER — Encounter: Payer: Self-pay | Admitting: Family Medicine

## 2011-02-17 NOTE — Letter (Signed)
Summary: RETURNED LETTER  RETURNED LETTER   Imported By: Lind Guest 02/10/2011 10:57:33  _____________________________________________________________________  External Attachment:    Type:   Image     Comment:   External Document

## 2011-05-14 NOTE — Discharge Summary (Signed)
NAME:  ALYSE, KATHAN                        ACCOUNT NO.:  [id number]   MEDICAL RECORD NO.:  [id number]                   PATIENT TYPE:  INP   LOCATION:  A338                                 FACILITY:  [facility]   PHYSICIAN:  Dirk Dress. Katrinka Blazing, M.D.                DATE OF BIRTH:  1961/04/06   DATE OF ADMISSION:  05/13/2003  DATE OF DISCHARGE:  05/16/2003                                 DISCHARGE SUMMARY   DISCHARGE DIAGNOSES:  1. Acute appendicitis.  2. Hypertension.  3. Chronic anemia.   SPECIAL PROCEDURE:  Laparoscopic appendectomy on May 13, 2003.   DISPOSITION:  The patient is discharged home in stable and satisfactory  condition.   DISCHARGE MEDICATIONS:  1. Levaquin 500 mg daily.  2. Tylox one q.4h. p.r.n. for pain.   The patient will be seen in the office on May 29, 2003.   SUMMARY:  A 50 year old female with a history of onset of abdominal pain  four days prior to admission.  The pain became much worse over the weekend  and she was seen in the emergency room two days prior to admission.  She was  discharged home from the emergency room but her symptoms became worse.  She  had an abdominal CT scan on the morning of admission and this showed acute  appendicitis with extensive periappendiceal inflammatory changes and  phleboliths pelvis.  The patient was admitted for acute appendicitis.   PAST MEDICAL HISTORY:  1. Positive only for chronic anemia.  2. Hypertension.   EXAMINATION:  VITAL SIGNS:  Blood pressure 120/70, pulse 76, respirations  20, temperature 99.1.  ABDOMEN:  Revealed the abdomen to be distended with guarding.  She had a  mass effect in the right lower quadrant with moderate tenderness in the  right lower quadrant.   White count 12.9, hemoglobin 12.2, hematocrit 37.3.  Potassium 3.3.   The patient was given Ancef and Levaquin IV.   She underwent laparoscopic appendectomy on the day of admission.  She did  very well.  She had no difficulty in the  postoperative period.  She  tolerated diet.  White count was 10.9 on the first postoperative day.  She  was continued in  the hospital for antibiotics because of the periappendiceal inflammation.  A  drain showed decreasing volume of drainage.  She became afebrile.  Had  return of normal intestinal activity.  By the 19th she tolerated a regular  diet.  She was doing well and was discharged home on the May 16, 2003 in  satisfactory condition.                                                Dirk Dress. Katrinka Blazing, M.D.    LCS/MEDQ  D:  07/02/2003  T:  07/03/2003  Job:  [id number]

## 2011-05-14 NOTE — Op Note (Signed)
   NAME:  Paula Randolph, Paula Randolph                        ACCOUNT NO.:  [id number]   MEDICAL RECORD NO.:  [id number]                   PATIENT TYPE:  INP   LOCATION:  A338                                 FACILITY:  [facility]   PHYSICIAN:  Dirk Dress. Katrinka Blazing, M.D.                DATE OF BIRTH:  07-06-1961   DATE OF PROCEDURE:  05/13/2003  DATE OF DISCHARGE:                                 OPERATIVE REPORT   PREOPERATIVE DIAGNOSIS:  Acute appendicitis.   POSTOPERATIVE DIAGNOSIS:  Acute appendicitis.   PROCEDURE:  Laparoscopic appendectomy.   SURGEON:  Dirk Dress. Katrinka Blazing, M.D.   DESCRIPTION OF PROCEDURE:  Under general anesthesia, the patient's abdomen  was prepped and draped in a sterile field.  A supraumbilical incision was  made and a Veress needle was inserted uneventfully.  The abdomen was  insufflated with 4 L of CO2.  Using a Visiport catheter, a 10 mm port was  placed uneventfully.  The laparoscope was placed.  Under videoscopic  guidance a 5 mm port was placed into the suprapubic midline and a 12 mm port  was placed in the left lower quadrant.  The appendix was visualized.  It was  partially retrocecal.  Using sharp and blunt dissection, the appendix was  mobilized.  The vessels of the mesoappendix were dissected and clipped with  hemoclips and divided.  The base of the appendix was transected using an  Endo-GIA stapler with standard staples.  There was no bleeding from the  staple line.  The appendix was placed in an EndoCatch device and retrieved.  Irrigation was carried out.  There was no bleeding in the operative area.  A  JP drain was placed because of the increased amount of inflammation in this  area.  It was brought out through the suprapubic midline and sutured with 3-  0 nylon.  The CO2 was allowed to escape from the abdomen, and the ports were  removed.  The incision at the umbilicus was closed with 0 Dexon on the  fascia.  Staples were used to close the skin.  Dressings were placed.   The  patient tolerated the procedure well.  She was awakened from anesthesia,  transferred to a bed, and taken to the postanesthetic care unit.                                               Dirk Dress. Katrinka Blazing, M.D.    LCS/MEDQ  D:  05/13/2003  T:  05/14/2003  Job:  [id number]   cc:   Milus Mallick. Lodema Hong, M.D.  [address]  [place], [place] [address]  Fax: [phone]

## 2011-06-23 ENCOUNTER — Encounter: Payer: Self-pay | Admitting: Family Medicine

## 2011-06-24 ENCOUNTER — Ambulatory Visit: Payer: Self-pay | Admitting: Family Medicine

## 2011-06-24 ENCOUNTER — Encounter: Payer: Self-pay | Admitting: Family Medicine

## 2011-07-21 ENCOUNTER — Ambulatory Visit: Payer: Self-pay | Admitting: Oncology

## 2011-07-22 LAB — CANCER ANTIGEN 27.29: CA 27.29: 18.5 U/mL (ref 0.0–38.6)

## 2011-07-28 ENCOUNTER — Ambulatory Visit: Payer: Self-pay | Admitting: Oncology

## 2011-08-28 ENCOUNTER — Ambulatory Visit: Payer: Self-pay | Admitting: Oncology

## 2011-09-13 ENCOUNTER — Encounter: Payer: Self-pay | Admitting: Family Medicine

## 2011-09-14 ENCOUNTER — Encounter: Payer: Self-pay | Admitting: Family Medicine

## 2011-09-14 ENCOUNTER — Ambulatory Visit (INDEPENDENT_AMBULATORY_CARE_PROVIDER_SITE_OTHER): Payer: BC Managed Care – PPO | Admitting: Family Medicine

## 2011-09-14 VITALS — BP 138/90 | HR 75 | Resp 16 | Ht 64.5 in | Wt 173.0 lb

## 2011-09-14 DIAGNOSIS — C50919 Malignant neoplasm of unspecified site of unspecified female breast: Secondary | ICD-10-CM

## 2011-09-14 DIAGNOSIS — E663 Overweight: Secondary | ICD-10-CM

## 2011-09-14 DIAGNOSIS — Z1211 Encounter for screening for malignant neoplasm of colon: Secondary | ICD-10-CM

## 2011-09-14 DIAGNOSIS — I10 Essential (primary) hypertension: Secondary | ICD-10-CM

## 2011-09-14 DIAGNOSIS — R5381 Other malaise: Secondary | ICD-10-CM

## 2011-09-14 DIAGNOSIS — E785 Hyperlipidemia, unspecified: Secondary | ICD-10-CM

## 2011-09-14 DIAGNOSIS — R5383 Other fatigue: Secondary | ICD-10-CM

## 2011-09-14 DIAGNOSIS — E559 Vitamin D deficiency, unspecified: Secondary | ICD-10-CM

## 2011-09-14 MED ORDER — BENAZEPRIL HCL 10 MG PO TABS: 10.0000 mg | ORAL_TABLET | Freq: Every day | ORAL | Status: DC

## 2011-09-14 NOTE — Patient Instructions (Signed)
CPE mid to end February.  You will be referred for a colonscopy.  Fasting labs are past due, pl get as soon as possible  Your medication will be filled for 6 months  It is important that you exercise regularly at least 30 minutes 5 times a week. If you develop chest pain, have severe difficulty breathing, or feel very tired, stop exercising immediately and seek medical attention    A healthy diet is rich in fruit, vegetables and whole grains. Poultry fish, nuts and beans are a healthy choice for protein rather then red meat. A low sodium diet and drinking 64 ounces of water daily is generally recommended. Oils and sweet should be limited. Carbohydrates especially for those who are diabetic or overweight, should be limited to 30-45 gram per meal. It is important to eat on a regular schedule, at least 3 times daily. Snacks should be primarily fruits, vegetables or nuts.

## 2011-09-19 NOTE — Assessment & Plan Note (Signed)
Improved. Pt applauded on succesful weight loss through lifestyle change, and encouraged to continue same. Weight loss goal set for the next several months.  

## 2011-09-19 NOTE — Assessment & Plan Note (Addendum)
Uncontrolled, pt needs lotensin medication and this is sent in. DASH diet and the value of exercise also stressed

## 2011-09-19 NOTE — Assessment & Plan Note (Signed)
No recurrence to date, followed by oncology

## 2011-09-19 NOTE — Progress Notes (Signed)
  Subjective:    Patient ID: Paula Randolph, female    DOB: Apr 28, 1961, 50 y.o.   MRN: [id number]  HPI The PT is here for follow up and re-evaluation of chronic medical conditions, medication management and review of any available recent lab and radiology data.  Preventive health is updated, specifically  Cancer screening and Immunization.   Questions or concerns regarding consultations or procedures which the PT has had in the interim are  addressed. The PT denies any adverse reactions to current medications since the last visit.  Has been out of blood pressure med for several weeks, and wants/needs a refill       Review of Systems Denies recent fever or chills. Denies sinus pressure, nasal congestion, ear pain or sore throat. Denies chest congestion, productive cough or wheezing. Denies chest pains, palpitations and leg swelling Denies abdominal pain, nausea, vomiting,diarrhea or constipation.   Denies dysuria, frequency, hesitancy or incontinence. Denies joint pain, swelling and limitation in mobility. Denies headaches, seizures, numbness, or tingling. Denies depression, anxiety or insomnia. Denies skin break down or rash.        Objective:   Physical Exam Patient alert and oriented and in no cardiopulmonary distress.  HEENT: No facial asymmetry, EOMI, no sinus tenderness,  oropharynx pink and moist.  Neck supple no adenopathy.  Chest: Clear to auscultation bilaterally.  CVS: S1, S2 no murmurs, no S3.  ABD: Soft non tender. Bowel sounds normal.  Ext: No edema  MS: Adequate ROM spine, shoulders, hips and knees.  Skin: Intact, no ulcerations or rash noted.  Psych: Good eye contact, normal affect. Memory intact not anxious or depressed appearing.  CNS: CN 2-12 intact, power, tone and sensation normal throughout.        Assessment & Plan:

## 2011-09-19 NOTE — Assessment & Plan Note (Signed)
Low fat diet discussed and encouraged, rept labs prior to f/u

## 2011-09-23 LAB — URINALYSIS, ROUTINE W REFLEX MICROSCOPIC
Leukocytes, UA: NEGATIVE
Nitrite: POSITIVE — AB
Specific Gravity, Urine: 1.02
Urobilinogen, UA: 0.2
pH: 6.5

## 2011-09-23 LAB — CBC
Hemoglobin: 10.1 — ABNORMAL LOW
Platelets: 234
RDW: 12.6

## 2011-09-23 LAB — DIFFERENTIAL
Eosinophils Absolute: 0
Lymphocytes Relative: 8 — ABNORMAL LOW
Lymphs Abs: 0.9
Neutrophils Relative %: 90 — ABNORMAL HIGH

## 2011-09-23 LAB — COMPREHENSIVE METABOLIC PANEL
AST: 17
Albumin: 3.6
Calcium: 9.4
Chloride: 109
Creatinine, Ser: 0.8
GFR calc non Af Amer: >60
Potassium: 3.9

## 2011-09-23 LAB — PREGNANCY, URINE: Preg Test, Ur: NEGATIVE

## 2011-09-23 LAB — URINE MICROSCOPIC-ADD ON

## 2011-10-13 LAB — CBC WITH DIFFERENTIAL/PLATELET
Eosinophils Absolute: 0.3 10*3/uL (ref 0.0–0.7)
Eosinophils Relative: 4 % (ref 0–5)
HCT: 38 % (ref 36.0–46.0)
Lymphocytes Relative: 28 % (ref 12–46)
Lymphs Abs: 1.8 10*3/uL (ref 0.7–4.0)
MCH: 25.9 pg — ABNORMAL LOW (ref 26.0–34.0)
MCV: 85.6 fL (ref 78.0–100.0)
Monocytes Absolute: 0.5 10*3/uL (ref 0.1–1.0)
Monocytes Relative: 8 % (ref 3–12)
RBC: 4.44 MIL/uL (ref 3.87–5.11)
WBC: 6.5 10*3/uL (ref 4.0–10.5)

## 2011-10-13 LAB — BASIC METABOLIC PANEL
BUN: 13 mg/dL (ref 6–23)
Chloride: 105 mEq/L (ref 96–112)
Potassium: 4.6 mEq/L (ref 3.5–5.3)
Sodium: 143 mEq/L (ref 135–145)

## 2011-10-13 LAB — TSH: TSH: 1.279 u[IU]/mL (ref 0.350–4.500)

## 2011-10-13 LAB — LIPID PANEL: VLDL: 16 mg/dL (ref 0–40)

## 2011-10-16 LAB — VITAMIN D 1,25 DIHYDROXY
Vitamin D 1, 25 (OH)2 Total: 64 pg/mL (ref 18–72)
Vitamin D2 1, 25 (OH)2: <8 pg/mL
Vitamin D3 1, 25 (OH)2: 64 pg/mL

## 2011-10-19 ENCOUNTER — Telehealth: Payer: Self-pay | Admitting: *Deleted

## 2011-10-19 NOTE — Telephone Encounter (Signed)
Message copied by Diamantina Monks on Tue Oct 19, 2011  2:33 PM ------      Message from: Syliva Overman MD E      Created: Wed Oct 13, 2011  5:35 AM       Please advise patient Cholesterol is elevated.  They need to increase fresh or frozen fruit and vegetable intake.  Reduce red meats max twice weekly.  Increase white meat, baked, grilled or broiled e.g. Fish, [place] breast and chicken breast.                  Egg white boiled is an excellent source of protein and beans cooked without butter or oil.  Work towards exercising at least 5 days/week for 30 minutes each session, to improve the good cholesterol.  Cut back on butter, cheese, oils and the yellow of eggs, preferably drink 1% or skimmed milk.  The healthy oils are olive and canola, use in moderation; The healthiest "butter" substitute is Smart balance.

## 2011-10-19 NOTE — Telephone Encounter (Signed)
Called patient, no answer 

## 2012-01-25 ENCOUNTER — Ambulatory Visit: Payer: Self-pay

## 2012-02-14 ENCOUNTER — Encounter: Payer: BC Managed Care – PPO | Admitting: Family Medicine

## 2012-03-02 ENCOUNTER — Emergency Department (HOSPITAL_COMMUNITY)
Admission: EM | Admit: 2012-03-02 | Discharge: 2012-03-02 | Disposition: A | Discharge status: Home / Self Care | Payer: Self-pay | Attending: Emergency Medicine | Admitting: Emergency Medicine

## 2012-03-02 ENCOUNTER — Encounter (HOSPITAL_COMMUNITY): Payer: Self-pay | Admitting: *Deleted

## 2012-03-02 DIAGNOSIS — Z9010 Acquired absence of unspecified breast and nipple: Secondary | ICD-10-CM | POA: Insufficient documentation

## 2012-03-02 DIAGNOSIS — M1710 Unilateral primary osteoarthritis, unspecified knee: Secondary | ICD-10-CM | POA: Insufficient documentation

## 2012-03-02 DIAGNOSIS — IMO0002 Reserved for concepts with insufficient information to code with codable children: Secondary | ICD-10-CM | POA: Insufficient documentation

## 2012-03-02 DIAGNOSIS — Z833 Family history of diabetes mellitus: Secondary | ICD-10-CM | POA: Insufficient documentation

## 2012-03-02 DIAGNOSIS — Z90710 Acquired absence of both cervix and uterus: Secondary | ICD-10-CM | POA: Insufficient documentation

## 2012-03-02 DIAGNOSIS — Z803 Family history of malignant neoplasm of breast: Secondary | ICD-10-CM | POA: Insufficient documentation

## 2012-03-02 DIAGNOSIS — I10 Essential (primary) hypertension: Secondary | ICD-10-CM | POA: Insufficient documentation

## 2012-03-02 DIAGNOSIS — N390 Urinary tract infection, site not specified: Secondary | ICD-10-CM | POA: Insufficient documentation

## 2012-03-02 DIAGNOSIS — E785 Hyperlipidemia, unspecified: Secondary | ICD-10-CM | POA: Insufficient documentation

## 2012-03-02 DIAGNOSIS — Z853 Personal history of malignant neoplasm of breast: Secondary | ICD-10-CM | POA: Insufficient documentation

## 2012-03-02 LAB — URINALYSIS, ROUTINE W REFLEX MICROSCOPIC
Glucose, UA: NEGATIVE mg/dL
Leukocytes, UA: NEGATIVE
Nitrite: POSITIVE — AB
Protein, ur: NEGATIVE mg/dL
pH: 6.5 (ref 5.0–8.0)

## 2012-03-02 LAB — URINE MICROSCOPIC-ADD ON

## 2012-03-02 MED ORDER — NITROFURANTOIN MONOHYD MACRO 100 MG PO CAPS: 100.0000 mg | ORAL_CAPSULE | Freq: Two times a day (BID) | ORAL | Status: AC

## 2012-03-02 MED ORDER — NITROFURANTOIN MONOHYD MACRO 100 MG PO CAPS
100.0000 mg | ORAL_CAPSULE | Freq: Two times a day (BID) | ORAL | Status: DC
  Administered 2012-03-02: 100 mg via ORAL
  Filled 2012-03-02: qty 1

## 2012-03-02 NOTE — ED Notes (Signed)
Pt states pressure sensation to abdomen, lower back pain, urinary frequency x 1 week.

## 2012-03-02 NOTE — ED Provider Notes (Addendum)
History     CSN: [id number]  Arrival date & time 03/02/12  1337   First MD Initiated Contact with Patient 03/02/12 1354      Chief Complaint  Patient presents with  . Urinary Frequency    (Consider location/radiation/quality/duration/timing/severity/associated sxs/prior treatment) Patient is a 51 y.o. female presenting with frequency. The history is provided by the patient.  Urinary Frequency  She has noted urinary urgency, frequency, and tenesmus for the last 4 days. She also states there is some irritation in her vaginal area when she urinates, but not actual pain. Symptoms are study neither getting worse and are better. Nothing makes her feel better nothing makes her feel worse. She denies abdominal pain, flank pain, nausea, vomiting, diarrhea, constipation, fever, chills, sweats. She has had some mild suprapubic pressure. Overall, symptoms are moderate. She states she has had urinary infections in the past but many years ago. She has not done anything to try and treat her symptoms.  Past Medical History  Diagnosis Date  . Hypertension   . Cancer     right breast   . Vitamin d deficiency   . Dyslipidemia   . Overweight   . Allergy   . Primary osteoarthritis of both knees   . Right arm pain   . Trochanteric bursitis   . Strain of lumbar spine     Past Surgical History  Procedure Date  . Abdominal hysterectomy   . Right breast    . Mastectomy     Family History  Problem Relation Age of Onset  . Cancer Mother     breast   . Diabetes Father   . Diabetes Brother   . Diabetes Sister     History  Substance Use Topics  . Smoking status: Never Smoker   . Smokeless tobacco: Not on file  . Alcohol Use: No    OB History    Grav Para Term Preterm Abortions TAB SAB Ect Mult Living                  Review of Systems  Genitourinary: Positive for frequency.  All other systems reviewed and are negative.    Allergies  Review of patient's allergies indicates no  known allergies.  Home Medications   Current Outpatient Rx  Name Route Sig Dispense Refill  . ANASTROZOLE 1 MG PO TABS Oral Take 1 mg by mouth daily.      Marland Kitchen BENAZEPRIL HCL 10 MG PO TABS Oral Take 1 tablet (10 mg total) by mouth daily. 30 tablet 3  . ONE-DAILY MULTI VITAMINS PO TABS Oral Take 1 tablet by mouth daily.        BP 166/85  Pulse 81  Temp(Src) 97.5 F (36.4 C) (Oral)  Resp 20  Ht 5\' 4"  (1.626 m)  Wt 170 lb (77.111 kg)  BMI 29.18 kg/m2  SpO2 100%  Physical Exam  Nursing note and vitals reviewed.  51 year old female who is resting comfortably and in no acute distress. Vital signs show a mild hypertension with blood pressure 166/85. Oxygen saturation is 100% which is normal. Head is normocephalic and atraumatic. PERRLA, EOMI. Oropharynx is clear. Neck is nontender and supple. Back is nontender there's no CVA tenderness. Lungs are clear without rales, wheezes, rhonchi. Heart has regular rate rhythm without murmur. Abdomen is soft, flat, nontender without masses or hepatosplenomegaly and peristalsis is present. Extremities no cyanosis or edema, full range of motion is present. Skin is warm and dry without rash. Neurologic: Mental status is  normal, cranial nerves are intact, there no focal motor or sensory deficits.  ED Course  Procedures (including critical care time)  Results for orders placed during the hospital encounter of 03/02/12  URINALYSIS, ROUTINE W REFLEX MICROSCOPIC      Component Value Range   Color, Urine YELLOW  YELLOW    APPearance CLEAR  CLEAR    Specific Gravity, Urine 1.020  1.005 - 1.030    pH 6.5  5.0 - 8.0    Glucose, UA NEGATIVE  NEGATIVE (mg/dL)   Hgb urine dipstick NEGATIVE  NEGATIVE    Bilirubin Urine NEGATIVE  NEGATIVE    Ketones, ur NEGATIVE  NEGATIVE (mg/dL)   Protein, ur NEGATIVE  NEGATIVE (mg/dL)   Urobilinogen, UA 0.2  0.0 - 1.0 (mg/dL)   Nitrite POSITIVE (*) NEGATIVE    Leukocytes, UA NEGATIVE  NEGATIVE   URINE MICROSCOPIC-ADD ON       Component Value Range   Squamous Epithelial / LPF RARE  RARE    WBC, UA 0-2  <3 (WBC/hpf)   Bacteria, UA MANY (*) RARE       1. Urinary tract infection       MDM  Symptoms suggestive of a UTI. Urinalysis showed many bacteria in spite of almost no WBCs. However, positive nitrite is consistent with urinary infection. She'll be sent home with a prescription for Macrobid.        Dione Booze, MD 03/02/12 1405  Dione Booze, MD 03/02/12 [phone]

## 2012-03-02 NOTE — Discharge Instructions (Signed)
Urinary Tract Infection Infections of the urinary tract can start in several places. A bladder infection (cystitis), a kidney infection (pyelonephritis), and a prostate infection (prostatitis) are different types of urinary tract infections (UTIs). They usually get better if treated with medicines (antibiotics) that kill germs. Take all the medicine until it is gone. You or your child may feel better in a few days, but TAKE ALL MEDICINE or the infection may not respond and may become more difficult to treat. HOME CARE INSTRUCTIONS   Drink enough water and fluids to keep the urine clear or pale yellow. Cranberry juice is especially recommended, in addition to large amounts of water.   Avoid caffeine, tea, and carbonated beverages. They tend to irritate the bladder.   Alcohol may irritate the prostate.   Only take over-the-counter or prescription medicines for pain, discomfort, or fever as directed by your caregiver.  To prevent further infections:  Empty the bladder often. Avoid holding urine for long periods of time.   After a bowel movement, women should cleanse from front to back. Use each tissue only once.   Empty the bladder before and after sexual intercourse.  FINDING OUT THE RESULTS OF YOUR TEST Not all test results are available during your visit. If your or your child's test results are not back during the visit, make an appointment with your caregiver to find out the results. Do not assume everything is normal if you have not heard from your caregiver or the medical facility. It is important for you to follow up on all test results. SEEK MEDICAL CARE IF:   There is back pain.   Your baby is older than 3 months with a rectal temperature of 100.5 F (38.1 C) or higher for more than 1 day.   Your or your child's problems (symptoms) are no better in 3 days. Return sooner if you or your child is getting worse.  SEEK IMMEDIATE MEDICAL CARE IF:   There is severe back pain or lower  abdominal pain.   You or your child develops chills.   You have a fever.   Your baby is older than 3 months with a rectal temperature of 102 F (38.9 C) or higher.   Your baby is 4 months old or younger with a rectal temperature of 100.4 F (38 C) or higher.   There is nausea or vomiting.   There is continued burning or discomfort with urination.  MAKE SURE YOU:   Understand these instructions.   Will watch your condition.   Will get help right away if you are not doing well or get worse.  Document Released: 09/22/2005 Document Revised: 12/02/2011 Document Reviewed: 04/27/2007 [facility] Patient Information 2012 [place], [place].  Nitrofurantoin tablets or capsules What is this medicine? NITROFURANTOIN (nye troe fyoor AN toyn) is an antibiotic. It is used to treat urinary tract infections. This medicine may be used for other purposes; ask your health care provider or pharmacist if you have questions. What should I tell my health care provider before I take this medicine? They need to know if you have any of these conditions: -anemia -diabetes -glucose-6-phosphate dehydrogenase deficiency -kidney disease -liver disease -lung disease -other chronic illness -an unusual or allergic reaction to nitrofurantoin, other antibiotics, other medicines, foods, dyes or preservatives -pregnant or trying to get pregnant -breast-feeding How should I use this medicine? Take this medicine by mouth with a glass of water. Follow the directions on the prescription label. Take this medicine with food or milk. Take  your doses at regular intervals. Do not take your medicine more often than directed. Do not stop taking except on your doctor's advice. Talk to your pediatrician regarding the use of this medicine in children. While this drug may be prescribed for selected conditions, precautions do apply. Overdosage: If you think you have taken too much of this medicine contact a poison control center or  emergency room at once. NOTE: This medicine is only for you. Do not share this medicine with others. What if I miss a dose? If you miss a dose, take it as soon as you can. If it is almost time for your next dose, take only that dose. Do not take double or extra doses. What may interact with this medicine? -antacids containing magnesium trisilicate -probenecid -quinolone antibiotics like ciprofloxacin, lomefloxacin, norfloxacin and ofloxacin -sulfinpyrazone This list may not describe all possible interactions. Give your health care provider a list of all the medicines, herbs, non-prescription drugs, or dietary supplements you use. Also tell them if you smoke, drink alcohol, or use illegal drugs. Some items may interact with your medicine. What should I watch for while using this medicine? Tell your doctor or health care professional if your symptoms do not improve or if you get new symptoms. Drink several glasses of water a day. If you are taking this medicine for a long time, visit your doctor for regular checks on your progress. If you are diabetic, you may get a false positive result for sugar in your urine with certain brands of urine tests. Check with your doctor. What side effects may I notice from receiving this medicine? Side effects that you should report to your doctor or health care professional as soon as possible: -allergic reactions like skin rash or hives, swelling of the face, lips, or tongue -chest pain -cough -difficulty breathing -dizziness, drowsiness -fever or infection -joint aches or pains -pale or blue-tinted skin -redness, blistering, peeling or loosening of the skin, including inside the mouth -tingling, burning, pain, or numbness in hands or feet -unusual bleeding or bruising -unusually weak or tired -yellowing of eyes or skin Side effects that usually do not require medical attention (report to your doctor or health care professional if they continue or are  bothersome): -dark urine -diarrhea -headache -loss of appetite -nausea or vomiting -temporary hair loss This list may not describe all possible side effects. Call your doctor for medical advice about side effects. You may report side effects to FDA at 1-800-FDA-1088. Where should I keep my medicine? Keep out of the reach of children. Store at room temperature between 15 and 30 degrees C (59 and 86 degrees F). Protect from light. Throw away any unused medicine after the expiration date. NOTE: This sheet is a summary. It may not cover all possible information. If you have questions about this medicine, talk to your doctor, pharmacist, or health care provider.  2012, [employer]. (07/03/2008 3:56:47 PM)

## 2012-10-30 ENCOUNTER — Emergency Department (HOSPITAL_COMMUNITY)
Admission: EM | Admit: 2012-10-30 | Discharge: 2012-10-30 | Disposition: A | Discharge status: Home / Self Care | Payer: Self-pay | Attending: Emergency Medicine | Admitting: Emergency Medicine

## 2012-10-30 ENCOUNTER — Encounter (HOSPITAL_COMMUNITY): Payer: Self-pay | Admitting: Emergency Medicine

## 2012-10-30 ENCOUNTER — Emergency Department (HOSPITAL_COMMUNITY): Payer: Self-pay

## 2012-10-30 DIAGNOSIS — Z79899 Other long term (current) drug therapy: Secondary | ICD-10-CM | POA: Insufficient documentation

## 2012-10-30 DIAGNOSIS — Z9010 Acquired absence of unspecified breast and nipple: Secondary | ICD-10-CM | POA: Insufficient documentation

## 2012-10-30 DIAGNOSIS — M545 Low back pain, unspecified: Secondary | ICD-10-CM | POA: Insufficient documentation

## 2012-10-30 DIAGNOSIS — Z8739 Personal history of other diseases of the musculoskeletal system and connective tissue: Secondary | ICD-10-CM | POA: Insufficient documentation

## 2012-10-30 DIAGNOSIS — Z853 Personal history of malignant neoplasm of breast: Secondary | ICD-10-CM | POA: Insufficient documentation

## 2012-10-30 DIAGNOSIS — I10 Essential (primary) hypertension: Secondary | ICD-10-CM | POA: Insufficient documentation

## 2012-10-30 DIAGNOSIS — M549 Dorsalgia, unspecified: Secondary | ICD-10-CM

## 2012-10-30 DIAGNOSIS — E789 Disorder of lipoprotein metabolism, unspecified: Secondary | ICD-10-CM | POA: Insufficient documentation

## 2012-10-30 DIAGNOSIS — E663 Overweight: Secondary | ICD-10-CM | POA: Insufficient documentation

## 2012-10-30 DIAGNOSIS — E559 Vitamin D deficiency, unspecified: Secondary | ICD-10-CM | POA: Insufficient documentation

## 2012-10-30 DIAGNOSIS — Z90711 Acquired absence of uterus with remaining cervical stump: Secondary | ICD-10-CM | POA: Insufficient documentation

## 2012-10-30 MED ORDER — KETOROLAC TROMETHAMINE 60 MG/2ML IM SOLN
60.0000 mg | Freq: Once | INTRAMUSCULAR | Status: AC
  Administered 2012-10-30: 60 mg via INTRAMUSCULAR
  Filled 2012-10-30: qty 2

## 2012-10-30 MED ORDER — MELOXICAM 7.5 MG PO TABS: ORAL_TABLET | ORAL | Status: DC

## 2012-10-30 MED ORDER — HYDROCODONE-ACETAMINOPHEN 5-325 MG PO TABS: 1.0000 | ORAL_TABLET | ORAL | Status: DC | PRN

## 2012-10-30 MED ORDER — ONDANSETRON HCL 4 MG PO TABS
4.0000 mg | ORAL_TABLET | Freq: Once | ORAL | Status: AC
  Administered 2012-10-30: 4 mg via ORAL
  Filled 2012-10-30: qty 1

## 2012-10-30 NOTE — ED Provider Notes (Signed)
Medical screening examination/treatment/procedure(s) were performed by non-physician practitioner and as supervising physician I was immediately available for consultation/collaboration.   Azarah Dacy L Dadrian Ballantine, MD 10/30/12 1512 

## 2012-10-30 NOTE — ED Notes (Signed)
Patient with no complaints at this time. Respirations even and unlabored. Skin warm/dry. Discharge instructions reviewed with patient at this time. Patient given opportunity to voice concerns/ask questions. Patient discharged at this time and left Emergency Department with steady gait.   

## 2012-10-30 NOTE — ED Notes (Addendum)
Patient has had 9/10 aching pain from posterior L mid-thigh to mid calf x 2 months.  Pain began at work when she stood from seated position while reaching laterally.  The pain initially involved L lower back and radiated down buttock to calf, but now only involves site stated above.  Pain is worse when lying down to rest.  It occasionally causes a limp.  She has known osteoarthritis in bilateral knees.  Full passive/active ROM intact in L knee w/no pain elicited.  She has been to [facility] in the past and recv'd. 2 pain meds and muscle relaxant, which helped pain.  No other pain control attempts have helped including OTC meds and ice.

## 2012-10-30 NOTE — ED Notes (Signed)
Pt c/o intermittent left leg pain x 2 months.

## 2012-10-30 NOTE — ED Provider Notes (Signed)
History     CSN: [id number]  Arrival date & time 10/30/12  0750   First MD Initiated Contact with Patient 10/30/12 0805      Chief Complaint  Patient presents with  . Leg Pain    (Consider location/radiation/quality/duration/timing/severity/associated sxs/prior treatment) Patient is a 51 y.o. female presenting with leg pain. The history is provided by the patient.  Leg Pain  The incident occurred more than 1 week ago. There was no injury mechanism. The pain is present in the left leg (lower back). The quality of the pain is described as aching. The pain is moderate. The pain has been intermittent since onset. Pertinent negatives include no inability to bear weight, no loss of motion and no loss of sensation. She reports no foreign bodies present. The symptoms are aggravated by bearing weight. She has tried NSAIDs for the symptoms.    Past Medical History  Diagnosis Date  . Hypertension   . Cancer     right breast   . Vitamin D deficiency   . Dyslipidemia   . Overweight   . Allergy   . Primary osteoarthritis of both knees   . Right arm pain   . Trochanteric bursitis   . Strain of lumbar spine     Past Surgical History  Procedure Date  . Abdominal hysterectomy   . Right breast    . Mastectomy     Family History  Problem Relation Age of Onset  . Cancer Mother     breast   . Diabetes Father   . Diabetes Brother   . Diabetes Sister     History  Substance Use Topics  . Smoking status: Never Smoker   . Smokeless tobacco: Not on file  . Alcohol Use: No    OB History    Grav Para Term Preterm Abortions TAB SAB Ect Mult Living                  Review of Systems  Constitutional: Negative for activity change.       All ROS Neg except as noted in HPI  HENT: Negative for nosebleeds and neck pain.   Eyes: Negative for photophobia and discharge.  Respiratory: Negative for cough, shortness of breath and wheezing.   Cardiovascular: Negative for chest pain and  palpitations.  Gastrointestinal: Negative for abdominal pain and blood in stool.  Genitourinary: Negative for dysuria, frequency and hematuria.  Musculoskeletal: Positive for back pain and arthralgias.  Skin: Negative.   Neurological: Negative for dizziness, seizures and speech difficulty.  Psychiatric/Behavioral: Negative for hallucinations and confusion.    Allergies  Review of patient's allergies indicates no known allergies.  Home Medications   Current Outpatient Rx  Name  Route  Sig  Dispense  Refill  . ANASTROZOLE 1 MG PO TABS   Oral   Take 1 mg by mouth daily.           Marland Kitchen BENAZEPRIL HCL 10 MG PO TABS   Oral   Take 1 tablet (10 mg total) by mouth daily.   30 tablet   3   . ONE-DAILY MULTI VITAMINS PO TABS   Oral   Take 1 tablet by mouth daily.             BP 125/83  Pulse 74  Temp 98.2 F (36.8 C)  Resp 18  Ht 5\' 5"  (1.651 m)  Wt 185 lb (83.915 kg)  BMI 30.79 kg/m2  SpO2 100%  Physical Exam  Nursing note and  vitals reviewed. Constitutional: She is oriented to person, place, and time. She appears well-developed and well-nourished.  Non-toxic appearance.  HENT:  Head: Normocephalic.  Right Ear: Tympanic membrane and external ear normal.  Left Ear: Tympanic membrane and external ear normal.  Eyes: EOM and lids are normal. Pupils are equal, round, and reactive to light.  Neck: Normal range of motion. Neck supple. Carotid bruit is not present.  Cardiovascular: Normal rate, regular rhythm, normal heart sounds, intact distal pulses and normal pulses.   Pulmonary/Chest: Breath sounds normal. No respiratory distress.  Abdominal: Soft. Bowel sounds are normal. There is no tenderness. There is no guarding.  Musculoskeletal: Normal range of motion.       No straight leg raise pain, but pain from lower back to mid calf with walking.  Lymphadenopathy:       Head (right side): No submandibular adenopathy present.       Head (left side): No submandibular adenopathy  present.    She has no cervical adenopathy.  Neurological: She is alert and oriented to person, place, and time. She has normal strength. No cranial nerve deficit or sensory deficit.       No gross motor or sensory deficit of the left  Or right lower extremity.  Skin: Skin is warm and dry.  Psychiatric: She has a normal mood and affect. Her speech is normal.    ED Course  Procedures (including critical care time)  Labs Reviewed - No data to display No results found. Pulse oximetry 100% on room air. Within normal limits by my interpretation.  No diagnosis found.    MDM  I have reviewed nursing notes, vital signs, and all appropriate lab and imaging results for this patient. X-ray of the lumbar spine reveals degenerative facet disease changes and minimal scoliosis of the lumbar spine. No acute fracture appreciated in the disc space heights are maintained. The patient is treated with Mobic 2 times daily, and Norco every 4 hours as needed for pain. The patient is advised to see the orthopedic physician concerning her continued back pain.       Kathie Dike, [place] 10/30/12 1015

## 2013-02-13 ENCOUNTER — Ambulatory Visit: Payer: Self-pay | Admitting: Oncology

## 2013-02-15 LAB — IRON AND TIBC
Iron Saturation: 23 %
Iron: 94 ug/dL (ref 50–170)

## 2013-02-15 LAB — CBC CANCER CENTER
Basophil #: 0.1 x10 3/mm (ref 0.0–0.1)
Basophil %: 1 %
Eosinophil %: 4 %
HCT: 37.4 % (ref 35.0–47.0)
HGB: 11.9 g/dL — ABNORMAL LOW (ref 12.0–16.0)
Lymphocyte #: 1.6 x10 3/mm (ref 1.0–3.6)
MCH: 26.4 pg (ref 26.0–34.0)
MCHC: 32 g/dL (ref 32.0–36.0)
Monocyte #: 0.5 x10 3/mm (ref 0.2–0.9)
Neutrophil #: 4.6 x10 3/mm (ref 1.4–6.5)
Neutrophil %: 64.8 %
RBC: 4.52 10*6/uL (ref 3.80–5.20)
RDW: 13.3 % (ref 11.5–14.5)

## 2013-02-15 LAB — FERRITIN: Ferritin (ARMC): 65 ng/mL (ref 8–388)

## 2013-02-24 ENCOUNTER — Ambulatory Visit: Payer: Self-pay | Admitting: Oncology

## 2013-03-27 ENCOUNTER — Ambulatory Visit: Payer: Self-pay | Admitting: Oncology

## 2013-12-31 ENCOUNTER — Encounter (HOSPITAL_COMMUNITY): Payer: Self-pay | Admitting: Emergency Medicine

## 2013-12-31 ENCOUNTER — Emergency Department (HOSPITAL_COMMUNITY)
Admission: EM | Admit: 2013-12-31 | Discharge: 2013-12-31 | Disposition: A | Discharge status: Home / Self Care | Payer: BC Managed Care – PPO | Attending: Emergency Medicine | Admitting: Emergency Medicine

## 2013-12-31 DIAGNOSIS — H9209 Otalgia, unspecified ear: Secondary | ICD-10-CM | POA: Insufficient documentation

## 2013-12-31 DIAGNOSIS — M1710 Unilateral primary osteoarthritis, unspecified knee: Secondary | ICD-10-CM | POA: Insufficient documentation

## 2013-12-31 DIAGNOSIS — K047 Periapical abscess without sinus: Secondary | ICD-10-CM

## 2013-12-31 DIAGNOSIS — Z853 Personal history of malignant neoplasm of breast: Secondary | ICD-10-CM | POA: Insufficient documentation

## 2013-12-31 DIAGNOSIS — Z79899 Other long term (current) drug therapy: Secondary | ICD-10-CM | POA: Insufficient documentation

## 2013-12-31 DIAGNOSIS — E663 Overweight: Secondary | ICD-10-CM | POA: Insufficient documentation

## 2013-12-31 DIAGNOSIS — E785 Hyperlipidemia, unspecified: Secondary | ICD-10-CM | POA: Insufficient documentation

## 2013-12-31 DIAGNOSIS — Z87828 Personal history of other (healed) physical injury and trauma: Secondary | ICD-10-CM | POA: Insufficient documentation

## 2013-12-31 DIAGNOSIS — I10 Essential (primary) hypertension: Secondary | ICD-10-CM | POA: Insufficient documentation

## 2013-12-31 MED ORDER — AMOXICILLIN 250 MG PO CAPS
500.0000 mg | ORAL_CAPSULE | Freq: Once | ORAL | Status: AC
  Administered 2013-12-31: 500 mg via ORAL
  Filled 2013-12-31: qty 2

## 2013-12-31 MED ORDER — TRAMADOL HCL 50 MG PO TABS
50.0000 mg | ORAL_TABLET | Freq: Once | ORAL | Status: AC
  Administered 2013-12-31: 50 mg via ORAL
  Filled 2013-12-31: qty 1

## 2013-12-31 MED ORDER — IBUPROFEN 600 MG PO TABS: 600.0000 mg | ORAL_TABLET | Freq: Four times a day (QID) | ORAL | Status: DC | PRN

## 2013-12-31 MED ORDER — IBUPROFEN 800 MG PO TABS
800.0000 mg | ORAL_TABLET | Freq: Once | ORAL | Status: AC
  Administered 2013-12-31: 800 mg via ORAL
  Filled 2013-12-31: qty 1

## 2013-12-31 MED ORDER — AMOXICILLIN 500 MG PO CAPS: 500.0000 mg | ORAL_CAPSULE | Freq: Three times a day (TID) | ORAL | Status: AC

## 2013-12-31 MED ORDER — TRAMADOL HCL 50 MG PO TABS: 50.0000 mg | ORAL_TABLET | Freq: Four times a day (QID) | ORAL | Status: DC | PRN

## 2013-12-31 NOTE — ED Notes (Signed)
Pain rt ear and thinks dental pain also.

## 2013-12-31 NOTE — ED Provider Notes (Signed)
Medical screening examination/treatment/procedure(s) were performed by non-physician practitioner and as supervising physician I was immediately available for consultation/collaboration.  EKG Interpretation   None         Mervin Kung, MD 12/31/13 [phone]

## 2013-12-31 NOTE — ED Provider Notes (Signed)
CSN: [id number]     Arrival date & time 12/31/13  1642 History   First MD Initiated Contact with Patient 12/31/13 1921     Chief Complaint  Patient presents with  . Otalgia   (Consider location/radiation/quality/duration/timing/severity/associated sxs/prior Treatment) HPI Comments: Paula Randolph is a 53 y.o. Female presenting with a 2 day history of dental pain and gingival swelling.   She denies prior problems or injury in the tooth.  Pain is constant and radiates to her right ear.  There has been no fevers,  Chills, nausea or vomiting, also no complaint of difficulty swallowing,  Although chewing makes pain worse.  The patient has taken no medicines prior to arrival.     The history is provided by the patient.    Past Medical History  Diagnosis Date  . Hypertension   . Vitamin D deficiency   . Dyslipidemia   . Overweight(278.02)   . Allergy   . Primary osteoarthritis of both knees   . Right arm pain   . Trochanteric bursitis   . Strain of lumbar spine   . Cancer     right breast    Past Surgical History  Procedure Laterality Date  . Abdominal hysterectomy    . Right breast     . Mastectomy    . Appendectomy     Family History  Problem Relation Age of Onset  . Cancer Mother     breast   . Diabetes Father   . Diabetes Brother   . Diabetes Sister    History  Substance Use Topics  . Smoking status: Never Smoker   . Smokeless tobacco: Not on file  . Alcohol Use: No   OB History   Grav Para Term Preterm Abortions TAB SAB Ect Mult Living                 Review of Systems  Constitutional: Negative for fever.  HENT: Positive for dental problem. Negative for facial swelling and sore throat.   Respiratory: Negative for shortness of breath.   Musculoskeletal: Negative for neck pain and neck stiffness.    Allergies  Review of patient's allergies indicates no known allergies.  Home Medications   Current Outpatient Rx  Name  Route  Sig  Dispense  Refill  .  amoxicillin (AMOXIL) 500 MG capsule   Oral   Take 1 capsule (500 mg total) by mouth 3 (three) times daily.   30 capsule   0   . anastrozole (ARIMIDEX) 1 MG tablet   Oral   Take 1 mg by mouth daily.           . benazepril (LOTENSIN) 10 MG tablet   Oral   Take 10 mg by mouth daily.         Marland Kitchen HYDROcodone-acetaminophen (NORCO) 5-325 MG per tablet   Oral   Take 1 tablet by mouth every 4 (four) hours as needed for pain.   20 tablet   0   . ibuprofen (ADVIL,MOTRIN) 600 MG tablet   Oral   Take 1 tablet (600 mg total) by mouth every 6 (six) hours as needed.   30 tablet   0   . meloxicam (MOBIC) 7.5 MG tablet      1 po bid with food   12 tablet   0   . Multiple Vitamin (MULTIVITAMIN) tablet   Oral   Take 1 tablet by mouth daily.           . traMADol Veatrice Bourbon)  50 MG tablet   Oral   Take 1 tablet (50 mg total) by mouth every 6 (six) hours as needed.   15 tablet   0    BP 164/80  Pulse 73  Temp(Src) 98.3 F (36.8 C) (Oral)  Resp 20  Ht 5\' 4"  (1.626 m)  Wt 180 lb (81.647 kg)  BMI 30.88 kg/m2  SpO2 100% Physical Exam  Constitutional: She is oriented to person, place, and time. She appears well-developed and well-nourished. No distress.  HENT:  Head: Normocephalic and atraumatic.  Right Ear: Tympanic membrane and external ear normal.  Left Ear: Tympanic membrane and external ear normal.  Mouth/Throat: Oropharynx is clear and moist and mucous membranes are normal. No oral lesions. No trismus in the jaw. Dental abscesses present.    Generalized poor dental hygiene.  Erythema and mild edema surrounding right upper 2nd molar tooth.  No fluctuance.  No adenopathy.  Eyes: Conjunctivae are normal.  Neck: Normal range of motion. Neck supple.  Cardiovascular: Normal rate and normal heart sounds.   Pulmonary/Chest: Effort normal.  Abdominal: She exhibits no distension.  Musculoskeletal: Normal range of motion.  Lymphadenopathy:    She has no cervical adenopathy.    Neurological: She is alert and oriented to person, place, and time.  Skin: Skin is warm and dry. No erythema.  Psychiatric: She has a normal mood and affect.    ED Course  Procedures (including critical care time) Labs Review Labs Reviewed - No data to display Imaging Review No results found.  EKG Interpretation   None       MDM   1. Dental abscess    Amoxil, tramadol, ibuprofen.  Pt given dental referral list.    Evalee Jefferson, PA-C 12/31/13 1942

## 2013-12-31 NOTE — Discharge Instructions (Signed)
Abscessed Tooth °An abscessed tooth is an infection around your tooth. It may be caused by holes or damage to the tooth (cavity) or a dental disease. An abscessed tooth causes mild to very bad pain in and around the tooth. See your dentist right away if you have tooth or gum pain. °HOME CARE °· Take your medicine as told. Finish it even if you start to feel better. °· Do not drive after taking pain medicine. °· Rinse your mouth (gargle) often with salt water (¼ teaspoon salt in 8 ounces of warm water). °· Do not apply heat to the outside of your face. °GET HELP RIGHT AWAY IF:  °· You have a temperature by mouth above 102° F (38.9° C), not controlled by medicine. °· You have chills and a very bad headache. °· You have problems breathing or swallowing. °· Your mouth will not open. °· You develop puffiness (swelling) on the neck or around the eye. °· Your pain is not helped by medicine. °· Your pain is getting worse instead of better. °MAKE SURE YOU:  °· Understand these instructions. °· Will watch your condition. °· Will get help right away if you are not doing well or get worse. °Document Released: 05/31/2008 Document Revised: 03/06/2012 Document Reviewed: 03/23/2011 °[employer]® Patient Information ©2014 [employer]. ° ° °Complete your entire course of antibiotics as prescribed.  You  may use the tramadol for pain relief but do not drive within 4 hours of taking as this will make you drowsy.  Avoid applying heat or ice to this abscess area which can worsen your symptoms.  You may use warm salt water swish and spit treatment or half peroxide and water swish and spit after meals to keep this area clean as discussed.  Call the dentist listed above for further management of your symptoms. ° ° ° °

## 2014-01-23 ENCOUNTER — Telehealth: Payer: Self-pay | Admitting: Family Medicine

## 2014-01-23 NOTE — Telephone Encounter (Signed)
Ok to give her a new pt appt when available

## 2014-03-07 ENCOUNTER — Ambulatory Visit: Payer: Self-pay | Admitting: Oncology

## 2014-03-14 ENCOUNTER — Ambulatory Visit: Payer: Self-pay | Admitting: Oncology

## 2014-03-15 LAB — CBC CANCER CENTER
Basophil #: 0.1 x10 3/mm (ref 0.0–0.1)
Basophil %: 1.2 %
EOS ABS: 0.3 x10 3/mm (ref 0.0–0.7)
Eosinophil %: 4.6 %
HCT: 36.7 % (ref 35.0–47.0)
HGB: 11.5 g/dL — AB (ref 12.0–16.0)
LYMPHS ABS: 1.8 x10 3/mm (ref 1.0–3.6)
LYMPHS PCT: 24.1 %
MCH: 26 pg (ref 26.0–34.0)
MCHC: 31.4 g/dL — AB (ref 32.0–36.0)
MCV: 83 fL (ref 80–100)
Monocyte #: 0.6 x10 3/mm (ref 0.2–0.9)
Monocyte %: 8.4 %
NEUTROS PCT: 61.7 %
Neutrophil #: 4.6 x10 3/mm (ref 1.4–6.5)
Platelet: 368 x10 3/mm (ref 150–440)
RBC: 4.43 10*6/uL (ref 3.80–5.20)
RDW: 13.1 % (ref 11.5–14.5)
WBC: 7.5 x10 3/mm (ref 3.6–11.0)

## 2014-03-26 ENCOUNTER — Encounter: Payer: Self-pay | Admitting: Family Medicine

## 2014-03-26 ENCOUNTER — Encounter (INDEPENDENT_AMBULATORY_CARE_PROVIDER_SITE_OTHER): Payer: Self-pay

## 2014-03-26 ENCOUNTER — Ambulatory Visit (INDEPENDENT_AMBULATORY_CARE_PROVIDER_SITE_OTHER): Payer: BC Managed Care – PPO | Admitting: Family Medicine

## 2014-03-26 VITALS — BP 150/86 | HR 86 | Resp 18 | Ht 63.5 in | Wt 171.0 lb

## 2014-03-26 DIAGNOSIS — C50919 Malignant neoplasm of unspecified site of unspecified female breast: Secondary | ICD-10-CM

## 2014-03-26 DIAGNOSIS — F329 Major depressive disorder, single episode, unspecified: Secondary | ICD-10-CM

## 2014-03-26 DIAGNOSIS — F3289 Other specified depressive episodes: Secondary | ICD-10-CM

## 2014-03-26 DIAGNOSIS — F32A Depression, unspecified: Secondary | ICD-10-CM | POA: Insufficient documentation

## 2014-03-26 DIAGNOSIS — I10 Essential (primary) hypertension: Secondary | ICD-10-CM

## 2014-03-26 DIAGNOSIS — Z1211 Encounter for screening for malignant neoplasm of colon: Secondary | ICD-10-CM

## 2014-03-26 MED ORDER — MIRTAZAPINE 7.5 MG PO TABS: 7.5000 mg | ORAL_TABLET | Freq: Every day | ORAL | Status: DC

## 2014-03-26 MED ORDER — TRIAMTERENE-HCTZ 37.5-25 MG PO TABS: 1.0000 | ORAL_TABLET | Freq: Every day | ORAL | Status: DC

## 2014-03-26 NOTE — Patient Instructions (Signed)
CPE in 6 weeks, call if you need me before  New for your blood pressure is maxzide one daily  EKG today.  You will be refered for colonoscopy  We will obtain your recent blood tests and call you if anything needs to be done  Continue daily exercise.  New for depression and sleep is remeron take at bedtime

## 2014-03-27 ENCOUNTER — Ambulatory Visit: Payer: Self-pay | Admitting: Oncology

## 2014-03-27 ENCOUNTER — Telehealth: Payer: Self-pay | Admitting: Family Medicine

## 2014-03-27 DIAGNOSIS — E785 Hyperlipidemia, unspecified: Secondary | ICD-10-CM

## 2014-03-27 DIAGNOSIS — D649 Anemia, unspecified: Secondary | ICD-10-CM

## 2014-03-27 DIAGNOSIS — I10 Essential (primary) hypertension: Secondary | ICD-10-CM

## 2014-03-27 DIAGNOSIS — E663 Overweight: Secondary | ICD-10-CM

## 2014-03-27 NOTE — Telephone Encounter (Signed)
Noted and mailed lab requisition.

## 2014-03-27 NOTE — Telephone Encounter (Signed)
Pls let pt know and order needs fasting lipid, cmp , TSH , also order iron and ferritin level, her only recent blood test  Was for bone marrow function/anemia, she is mildly anemic

## 2014-03-27 NOTE — Addendum Note (Signed)
Addended by: Denman George B on: 03/27/2014 01:12 PM   Modules accepted: Orders

## 2014-03-30 ENCOUNTER — Encounter: Payer: Self-pay | Admitting: Family Medicine

## 2014-03-30 NOTE — Assessment & Plan Note (Addendum)
medicatrion to be started which will also help sleep which is poor,pt is not suicidal or homicidal

## 2014-03-30 NOTE — Assessment & Plan Note (Addendum)
Uncontrolled off medication  Start new, maxzide DASH diet and commitment to daily physical activity for a minimum of 30 minutes discussed and encouraged, as a part of hypertension management. The importance of attaining a healthy weight is also discussed. Baseline EKG in office shows no ischemic changes and sinus rhythm, No LVH

## 2014-03-30 NOTE — Assessment & Plan Note (Signed)
No evidence of recurrence per pt , and d/c from care of oncologist

## 2014-03-30 NOTE — Progress Notes (Signed)
   Subjective:    Patient ID: Paula Randolph, female    DOB: 02-12-1961, 53 y.o.   MRN: [id number]  HPI Pt in to re establish care. He is 7 years out of diagnosis and treatment of right breast cancer, was recently advised by her oncologist to return to her PCP for ongoing medical needs as she no longer needs specific treatment for breast cancer.  States generally doing well, aware that her bP is high and states she has been out oof medication for some time. Continues to exercise regualrly and to follow a heart healthy diet in an attempt to maintaing/attain good health Review of Systems See HPI Denies recent fever or chills. Denies sinus pressure, nasal congestion, ear pain or sore throat. Denies chest congestion, productive cough or wheezing. Denies chest pains, palpitations and leg swelling Denies abdominal pain, nausea, vomiting,diarrhea or constipation.   Denies dysuria, frequency, hesitancy or incontinence. Denies joint pain, swelling and limitation in mobility. Denies headaches, seizures, numbness, or tingling. C/o  depression, anxietyand r insomnia. Denies skin break down or rash.        Objective:   Physical Exam BP 150/86  Pulse 86  Resp 18  Ht 5' 3.5" (1.613 m)  Wt 171 lb 0.6 oz (77.583 kg)  BMI 29.82 kg/m2  SpO2 100% Patient alert and oriented and in no cardiopulmonary distress.  HEENT: No facial asymmetry, EOMI, no sinus tenderness,  oropharynx pink and moist.  Neck supple no adenopathy.  Chest: Clear to auscultation bilaterally.  CVS: S1, S2 no murmurs, no S3. EKG: sinus rhythm, no ischemia, no lVH ABD: Soft non tender. Bowel sounds normal.  Ext: No edema  MS: Adequate ROM spine, shoulders, hips and knees.  Skin: Intact, no ulcerations or rash noted.  Psych: Good eye contact, normal affect. Memory intact not anxious mildly depressed appearing.  CNS: CN 2-12 intact, power, tone and sensation normal throughout.        Assessment & Plan:    HYPERTENSION Uncontrolled off medication  Start new, maxzide DASH diet and commitment to daily physical activity for a minimum of 30 minutes discussed and encouraged, as a part of hypertension management. The importance of attaining a healthy weight is also discussed. Baseline EKG in office shows no ischemic changes and sinus rhythm, No LVH   Depression medicatrion to be started which will also help sleep which is poor,pt is not suicidal or homicidal  NEOPLASM, MALIGNANT, BREAST, RIGHT No evidence of recurrence per pt , and d/c from care of oncologist

## 2014-04-17 ENCOUNTER — Other Ambulatory Visit: Payer: Self-pay

## 2014-04-17 ENCOUNTER — Telehealth: Payer: Self-pay

## 2014-04-17 ENCOUNTER — Telehealth: Payer: Self-pay | Admitting: Gastroenterology

## 2014-04-17 ENCOUNTER — Encounter: Payer: Self-pay | Admitting: Family Medicine

## 2014-04-17 DIAGNOSIS — Z1211 Encounter for screening for malignant neoplasm of colon: Secondary | ICD-10-CM

## 2014-04-17 NOTE — Telephone Encounter (Signed)
Gastroenterology Pre-Procedure Review  Request Date: 04/17/2014 Requesting Physician: Dr. Moshe Cipro  PATIENT REVIEW QUESTIONS: The patient responded to the following health history questions as indicated:    1. Diabetes Melitis: no 2. Joint replacements in the past 12 months: no 3. Major health problems in the past 3 months: no 4. Has an artificial valve or MVP: no 5. Has a defibrillator: no 6. Has been advised in past to take antibiotics in advance of a procedure like teeth cleaning: no    MEDICATIONS & ALLERGIES:    Patient reports the following regarding taking any blood thinners:   Plavix? no Aspirin? no Coumadin? no  Patient confirms/reports the following medications:  Current Outpatient Prescriptions  Medication Sig Dispense Refill  . triamterene-hydrochlorothiazide (MAXZIDE-25) 37.5-25 MG per tablet Take 1 tablet by mouth daily.  30 tablet  3  . mirtazapine (REMERON) 7.5 MG tablet Take 1 tablet (7.5 mg total) by mouth at bedtime.  30 tablet  3   No current facility-administered medications for this visit.    Patient confirms/reports the following allergies:  No Known Allergies  No orders of the defined types were placed in this encounter.    AUTHORIZATION INFORMATION Primary Insurance:   ID #:   Group #:  Pre-Cert / Auth required:  Pre-Cert / Auth #:   Secondary Insurance:   ID #:   Group #:  Pre-Cert / Auth required:  Pre-Cert / Auth #:   SCHEDULE INFORMATION: Procedure has been scheduled as follows:  Date:   05/10/2014                  Time:  8:30 AM Location: [facility] Short Stay  This Gastroenterology Pre-Precedure Review Form is being routed to the following provider(s): R. Garfield Cornea, MD

## 2014-04-17 NOTE — Telephone Encounter (Signed)
Returned your call, please return hers

## 2014-04-17 NOTE — Telephone Encounter (Signed)
See separate triage.  ( pt was referred through [facility] for a screening colonoscopy, and had received my letter).

## 2014-04-18 NOTE — Telephone Encounter (Signed)
OK to schedule

## 2014-04-19 MED ORDER — PEG-KCL-NACL-NASULF-NA ASC-C 100 G PO SOLR: 1.0000 | ORAL | Status: DC

## 2014-04-19 NOTE — Telephone Encounter (Signed)
Rx sent to the pharmacy and instructions mailed to pt.  

## 2014-04-29 ENCOUNTER — Encounter (HOSPITAL_COMMUNITY): Payer: Self-pay | Admitting: Pharmacy Technician

## 2014-04-29 MED ORDER — PEG 3350-KCL-NA BICARB-NACL 420 G PO SOLR: 4000.0000 mL | Freq: Once | ORAL | Status: DC

## 2014-04-29 NOTE — Telephone Encounter (Signed)
Received a notice from pharmacy, Movie Prep not covered by insurance. I sent in [place] and mailed pt new instructions. I called her and LMOM that this had been done and to call and let me know that she got the call.

## 2014-04-29 NOTE — Addendum Note (Signed)
Addended by: Everardo All on: 04/29/2014 09:06 AM   Modules accepted: Orders

## 2014-05-02 ENCOUNTER — Telehealth: Payer: Self-pay | Admitting: Internal Medicine

## 2014-05-02 NOTE — Telephone Encounter (Signed)
I called and LMOM on mobile for pt to please call and let me know that she got new instructions for the Trily[place] prep.

## 2014-05-02 NOTE — Telephone Encounter (Signed)
Pt called this morning to speak with DS. I told her that I would have to take a message and DS would call her back. Patient said that she still hasn't received the instructions unless they come in the mail today. I told her if they don't arrive today to call us back. [phone]

## 2014-05-06 NOTE — Telephone Encounter (Signed)
LMOM to call if she has not received her instructions. She should have them. They were sent out on 04/29/2014 and she is scheculed for TCS on 05/10/2014.

## 2014-05-10 ENCOUNTER — Ambulatory Visit (HOSPITAL_COMMUNITY)
Admission: RE | Admit: 2014-05-10 | Discharge: 2014-05-10 | Disposition: A | Discharge status: Home / Self Care | Payer: BC Managed Care – PPO | Source: Ambulatory Visit | Attending: Internal Medicine | Admitting: Internal Medicine

## 2014-05-10 ENCOUNTER — Encounter (HOSPITAL_COMMUNITY)
Admission: RE | Disposition: A | Discharge status: Home / Self Care | Payer: Self-pay | Source: Ambulatory Visit | Attending: Internal Medicine

## 2014-05-10 ENCOUNTER — Encounter (HOSPITAL_COMMUNITY): Payer: Self-pay | Admitting: *Deleted

## 2014-05-10 DIAGNOSIS — D129 Benign neoplasm of anus and anal canal: Secondary | ICD-10-CM

## 2014-05-10 DIAGNOSIS — Z853 Personal history of malignant neoplasm of breast: Secondary | ICD-10-CM | POA: Insufficient documentation

## 2014-05-10 DIAGNOSIS — Z1211 Encounter for screening for malignant neoplasm of colon: Secondary | ICD-10-CM

## 2014-05-10 DIAGNOSIS — Q438 Other specified congenital malformations of intestine: Secondary | ICD-10-CM | POA: Insufficient documentation

## 2014-05-10 DIAGNOSIS — Z86010 Personal history of colonic polyps: Secondary | ICD-10-CM

## 2014-05-10 DIAGNOSIS — I10 Essential (primary) hypertension: Secondary | ICD-10-CM | POA: Insufficient documentation

## 2014-05-10 DIAGNOSIS — K5731 Diverticulosis of large intestine without perforation or abscess with bleeding: Secondary | ICD-10-CM

## 2014-05-10 DIAGNOSIS — M1710 Unilateral primary osteoarthritis, unspecified knee: Secondary | ICD-10-CM | POA: Insufficient documentation

## 2014-05-10 DIAGNOSIS — D128 Benign neoplasm of rectum: Secondary | ICD-10-CM | POA: Insufficient documentation

## 2014-05-10 DIAGNOSIS — E559 Vitamin D deficiency, unspecified: Secondary | ICD-10-CM | POA: Insufficient documentation

## 2014-05-10 DIAGNOSIS — K5730 Diverticulosis of large intestine without perforation or abscess without bleeding: Secondary | ICD-10-CM | POA: Insufficient documentation

## 2014-05-10 HISTORY — PX: COLONOSCOPY: SHX5424

## 2014-05-10 SURGERY — COLONOSCOPY
Anesthesia: Moderate Sedation

## 2014-05-10 MED ORDER — ONDANSETRON HCL 4 MG/2ML IJ SOLN
INTRAMUSCULAR | Status: AC
  Filled 2014-05-10: qty 2

## 2014-05-10 MED ORDER — ONDANSETRON HCL 4 MG/2ML IJ SOLN
INTRAMUSCULAR | Status: DC | PRN
  Administered 2014-05-10: 4 mg via INTRAVENOUS

## 2014-05-10 MED ORDER — STERILE WATER FOR IRRIGATION IR SOLN
Status: DC | PRN
  Administered 2014-05-10: 09:00:00

## 2014-05-10 MED ORDER — MIDAZOLAM HCL 5 MG/5ML IJ SOLN
INTRAMUSCULAR | Status: DC | PRN
  Administered 2014-05-10 (×2): 2 mg via INTRAVENOUS
  Administered 2014-05-10: 1 mg via INTRAVENOUS

## 2014-05-10 MED ORDER — MEPERIDINE HCL 100 MG/ML IJ SOLN
INTRAMUSCULAR | Status: DC | PRN
  Administered 2014-05-10: 25 mg via INTRAVENOUS
  Administered 2014-05-10 (×2): 50 mg via INTRAVENOUS

## 2014-05-10 MED ORDER — MEPERIDINE HCL 100 MG/ML IJ SOLN
INTRAMUSCULAR | Status: AC
  Filled 2014-05-10: qty 2

## 2014-05-10 MED ORDER — MIDAZOLAM HCL 5 MG/5ML IJ SOLN
INTRAMUSCULAR | Status: AC
  Filled 2014-05-10: qty 10

## 2014-05-10 MED ORDER — SODIUM CHLORIDE 0.9 % IV SOLN: INTRAVENOUS | Status: DC

## 2014-05-10 NOTE — Op Note (Signed)
[facility] [address] [place], [address]   COLONOSCOPY PROCEDURE REPORT  PATIENT: Paula Randolph, Paula Randolph  MR#:         [id number] BIRTHDATE: 1961/05/29 , 19  yrs. old GENDER: Female ENDOSCOPIST: R.  Garfield Cornea, MD FACP FACG REFERRED BY:  Tula Nakayama, M.D. PROCEDURE DATE:  05/10/2014 PROCEDURE:     Colonoscopy with biopsy  INDICATIONS: Average risk colorectal cancer screening examination  INFORMED CONSENT:  The risks, benefits, alternatives and imponderables including but not limited to bleeding, perforation as well as the possibility of a missed lesion have been reviewed.  The potential for biopsy, lesion removal, etc. have also been discussed.  Questions have been answered.  All parties agreeable. Please see the history and physical in the medical record for more information.  MEDICATIONS: Versed 5 mg IV and Demerol 125 mg IV in divided doses. Zofran 4 mg IV  DESCRIPTION OF PROCEDURE:  After a digital rectal exam was performed, the EC-[id number] ([id number])  colonoscope was advanced from the anus through the rectum and colon to the area of the cecum, ileocecal valve and appendiceal orifice.  The cecum was deeply intubated.  These structures were well-seen and photographed for the record.  From the level of the cecum and ileocecal valve, the scope was slowly and cautiously withdrawn.  The mucosal surfaces were carefully surveyed utilizing scope tip deflection to facilitate fold flattening as needed.  The scope was pulled down into the rectum where a thorough examination including retroflexion was performed.    FINDINGS:  Adequate preparation. Normal rectum; at the rectosigmoid junction was (1) diminutive polyp. The patient has scattered pancolonic diverticula.: Redundant. Patient required external abdominal pressure and changing of her position to reach the cecum. The remainder of the colonic mucosa appeared normal.  THERAPEUTIC / DIAGNOSTIC MANEUVERS  PERFORMED:  The above-mentioned rectosigmoid polyp was cold biopsied/removed  COMPLICATIONS: none  CECAL WITHDRAWAL TIME:  9 minutes  IMPRESSION:  Colonic diverticulosis-universal. Single rectosigmoid polyp-removed as described above. Redundant colon.  RECOMMENDATIONS: Followup on pathology. Further recommendations to follow.   _______________________________ eSigned:  R. Garfield Cornea, MD FACP [facility] 05/10/2014 9:29 AM   CC:

## 2014-05-10 NOTE — H&P (Signed)
@LOGO @   Primary Care Physician:  Tula Nakayama, MD Primary Gastroenterologist:  Dr. Gala Romney  Pre-Procedure History & Physical: HPI:  Paula Randolph is a 53 y.o. female is here for a screening colonoscopy.  Patient reports a negative colonoscopy by Dr. Irving Shows about 10 years ago. No bowel symptoms. No family history of colon cancer or polyps. Here for average risk screening colonoscopy.  Past Medical History  Diagnosis Date  . Hypertension   . Vitamin D deficiency   . Dyslipidemia   . Overweight   . Allergy   . Primary osteoarthritis of both knees   . Right arm pain   . Trochanteric bursitis   . Strain of lumbar spine   . Cancer     right breast     Past Surgical History  Procedure Laterality Date  . Right breast     . Mastectomy    . Appendectomy    . Abdominal hysterectomy      fibroids  . Breast surgery Right 2008    partial    Prior to Admission medications   Medication Sig Start Date End Date Taking? Authorizing Provider  Multiple Vitamin (MULTIVITAMIN WITH MINERALS) TABS tablet Take 1 tablet by mouth daily.   Yes Historical Provider, MD  polyethylene glycol-electrolytes (NULYTELY/GOLYTELY) 420 G solution Take 4,000 mLs by mouth once. 04/29/14  Yes Daneil Dolin, MD  triamterene-hydrochlorothiazide (MAXZIDE-25) 37.5-25 MG per tablet Take 1 tablet by mouth daily. 03/26/14  Yes Fayrene Helper, MD    Allergies as of 04/17/2014  . (No Known Allergies)    Family History  Problem Relation Age of Onset  . Cancer Mother     breast   . Diabetes Father   . Diabetes Brother   . Diabetes Sister     History   Social History  . Marital Status: Single    Spouse Name: N/A    Number of Children: N/A  . Years of Education: N/A   Occupational History  . Not on file.   Social History Main Topics  . Smoking status: Never Smoker   . Smokeless tobacco: Not on file  . Alcohol Use: No  . Drug Use: No  . Sexual Activity: Yes    Birth Control/ Protection:  Surgical   Other Topics Concern  . Not on file   Social History Narrative  . No narrative on file    Review of Systems: See HPI, otherwise negative ROS  Physical Exam: BP 146/88  Pulse 71  Temp(Src) 97.9 F (36.6 C) (Oral)  Resp 22  Ht 5\' 4"  (1.626 m)  Wt 178 lb (80.74 kg)  BMI 30.54 kg/m2  SpO2 100% General:   Alert,  Well-developed, well-nourished, pleasant and cooperative in NAD Head:  Normocephalic and atraumatic. Eyes:  Sclera clear, no icterus.   Conjunctiva pink. Ears:  Normal auditory acuity. Nose:  No deformity, discharge,  or lesions. Mouth:  No deformity or lesions, dentition normal. Neck:  Supple; no masses or thyromegaly. Lungs:  Clear throughout to auscultation.   No wheezes, crackles, or rhonchi. No acute distress. Heart:  Regular rate and rhythm; no murmurs, clicks, rubs,  or gallops. Abdomen:  Soft, nontender and nondistended. No masses, hepatosplenomegaly or hernias noted. Normal bowel sounds, without guarding, and without rebound.   Msk:  Symmetrical without gross deformities. Normal posture. Pulses:  Normal pulses noted. Extremities:  Without clubbing or edema. Neurologic:  Alert and  oriented x4;  grossly normal neurologically. Skin:  Intact without significant lesions  or rashes. Cervical Nodes:  No significant cervical adenopathy. Psych:  Alert and cooperative. Normal mood and affect.  Impression/Plan: Paula Randolph is now here to undergo a screening colonoscopy.  Average risk screening examination. Risks, benefits, limitations, imponderables and alternatives regarding colonoscopy have been reviewed with the patient. Questions have been answered. All parties agreeable.     Notice:  This dictation was prepared with Dragon dictation along with smaller phrase technology. Any transcriptional errors that result from this process are unintentional and may not be corrected upon review.

## 2014-05-10 NOTE — Progress Notes (Signed)
[id number] Patient stated all right to use either arm for blood pressure.

## 2014-05-10 NOTE — Discharge Instructions (Addendum)
Colonoscopy Discharge Instructions  Read the instructions outlined below and refer to this sheet in the next few weeks. These discharge instructions provide you with general information on caring for yourself after you leave the hospital. Your doctor may also give you specific instructions. While your treatment has been planned according to the most current medical practices available, unavoidable complications occasionally occur. If you have any problems or questions after discharge, call Dr. Gala Romney at [phone]. ACTIVITY  You may resume your regular activity, but move at a slower pace for the next 24 hours.   Take frequent rest periods for the next 24 hours.   Walking will help get rid of the air and reduce the bloated feeling in your belly (abdomen).   No driving for 24 hours (because of the medicine (anesthesia) used during the test).    Do not sign any important legal documents or operate any machinery for 24 hours (because of the anesthesia used during the test).  NUTRITION  Drink plenty of fluids.   You may resume your normal diet as instructed by your doctor.   Begin with a light meal and progress to your normal diet. Heavy or fried foods are harder to digest and may make you feel sick to your stomach (nauseated).   Avoid alcoholic beverages for 24 hours or as instructed.  MEDICATIONS  You may resume your normal medications unless your doctor tells you otherwise.  WHAT YOU CAN EXPECT TODAY  Some feelings of bloating in the abdomen.   Passage of more gas than usual.   Spotting of blood in your stool or on the toilet paper.  IF YOU HAD POLYPS REMOVED DURING THE COLONOSCOPY:  No aspirin products for 7 days or as instructed.   No alcohol for 7 days or as instructed.   Eat a soft diet for the next 24 hours.  FINDING OUT THE RESULTS OF YOUR TEST Not all test results are available during your visit. If your test results are not back during the visit, make an appointment  with your caregiver to find out the results. Do not assume everything is normal if you have not heard from your caregiver or the medical facility. It is important for you to follow up on all of your test results.  SEEK IMMEDIATE MEDICAL ATTENTION IF:  You have more than a spotting of blood in your stool.   Your belly is swollen (abdominal distention).   You are nauseated or vomiting.   You have a temperature over 101.   You have abdominal pain or discomfort that is severe or gets worse throughout the day.     Polyp and diverticulosis information provided  Further recommendations to follow pending review of pathology for  Diverticulitis A diverticulum is a small pouch or sac on the colon. Diverticulosis is the presence of these diverticula on the colon. Diverticulitis is the irritation (inflammation) or infection of diverticula. CAUSES  The colon and its diverticula contain bacteria. If food particles block the tiny opening to a diverticulum, the bacteria inside can grow and cause an increase in pressure. This leads to infection and inflammation and is called diverticulitis. SYMPTOMS   Abdominal pain and tenderness. Usually, the pain is located on the left side of your abdomen. However, it could be located elsewhere.  Fever.  Bloating.  Feeling sick to your stomach (nausea).  Throwing up (vomiting).  Abnormal stools. DIAGNOSIS  Your caregiver will take a history and perform a physical exam. Since many things can cause abdominal  pain, other tests may be necessary. Tests may include:  Blood tests.  Urine tests.  X-ray of the abdomen.  CT scan of the abdomen. Sometimes, surgery is needed to determine if diverticulitis or other conditions are causing your symptoms. TREATMENT  Most of the time, you can be treated without surgery. Treatment includes:  Resting the bowels by only having liquids for a few days. As you improve, you will need to eat a low-fiber  diet.  Intravenous (IV) fluids if you are losing body fluids (dehydrated).  Antibiotic medicines that treat infections may be given.  Pain and nausea medicine, if needed.  Surgery if the inflamed diverticulum has burst. HOME CARE INSTRUCTIONS   Try a clear liquid diet (broth, tea, or water for as long as directed by your caregiver). You may then gradually begin a low-fiber diet as tolerated.  A low-fiber diet is a diet with less than 10 grams of fiber. Choose the foods below to reduce fiber in the diet:  White breads, cereals, rice, and pasta.  Cooked fruits and vegetables or soft fresh fruits and vegetables without the skin.  Ground or well-cooked tender beef, ham, veal, lamb, pork, or poultry.  Eggs and seafood.  After your diverticulitis symptoms have improved, your caregiver may put you on a high-fiber diet. A high-fiber diet includes 14 grams of fiber for every 1000 calories consumed. For a standard 2000 calorie diet, you would need 28 grams of fiber. Follow these diet guidelines to help you increase the fiber in your diet. It is important to slowly increase the amount fiber in your diet to avoid gas, constipation, and bloating.  Choose whole-grain breads, cereals, pasta, and brown rice.  Choose fresh fruits and vegetables with the skin on. Do not overcook vegetables because the more vegetables are cooked, the more fiber is lost.  Choose more nuts, seeds, legumes, dried peas, beans, and lentils.  Look for food products that have greater than 3 grams of fiber per serving on the Nutrition Facts label.  Take all medicine as directed by your caregiver.  If your caregiver has given you a follow-up appointment, it is very important that you go. Not going could result in lasting (chronic) or permanent injury, pain, and disability. If there is any problem keeping the appointment, call to reschedule. SEEK MEDICAL CARE IF:   Your pain does not improve.  You have a hard time  advancing your diet beyond clear liquids.  Your bowel movements do not return to normal. SEEK IMMEDIATE MEDICAL CARE IF:   Your pain becomes worse.  You have an oral temperature above 102 F (38.9 C), not controlled by medicine.  You have repeated vomiting.  You have bloody or black, tarry stools.  Symptoms that brought you to your caregiver become worse or are not getting better. MAKE SURE YOU:   Understand these instructions.  Will watch your condition.  Will get help right away if you are not doing well or get worse.  Colon Polyps Polyps are lumps of extra tissue growing inside the body. Polyps can grow in the large intestine (colon). Most colon polyps are noncancerous (benign). However, some colon polyps can become cancerous over time. Polyps that are larger than a pea may be harmful. To be safe, caregivers remove and test all polyps. CAUSES  Polyps form when mutations in the genes cause your cells to grow and divide even though no more tissue is needed. RISK FACTORS There are a number of risk factors that can increase  your chances of getting colon polyps. They include:  Being older than 50 years.  Family history of colon polyps or colon cancer.  Long-term colon diseases, such as colitis or Crohn disease.  Being overweight.  Smoking.  Being inactive.  Drinking too much alcohol. SYMPTOMS  Most small polyps do not cause symptoms. If symptoms are present, they may include:  Blood in the stool. The stool may look dark red or black.  Constipation or diarrhea that lasts longer than 1 week. DIAGNOSIS People often do not know they have polyps until their caregiver finds them during a regular checkup. Your caregiver can use 4 tests to check for polyps:  Digital rectal exam. The caregiver wears gloves and feels inside the rectum. This test would find polyps only in the rectum.  Barium enema. The caregiver puts a liquid called barium into your rectum before taking  X-rays of your colon. Barium makes your colon look white. Polyps are dark, so they are easy to see in the X-ray pictures.  Sigmoidoscopy. A thin, flexible tube (sigmoidoscope) is placed into your rectum. The sigmoidoscope has a light and tiny camera in it. The caregiver uses the sigmoidoscope to look at the last third of your colon.  Colonoscopy. This test is like sigmoidoscopy, but the caregiver looks at the entire colon. This is the most common method for finding and removing polyps. TREATMENT  Any polyps will be removed during a sigmoidoscopy or colonoscopy. The polyps are then tested for cancer. PREVENTION  To help lower your risk of getting more colon polyps:  Eat plenty of fruits and vegetables. Avoid eating fatty foods.  Do not smoke.  Avoid drinking alcohol.  Exercise every day.  Lose weight if recommended by your caregiver.  Eat plenty of calcium and folate. Foods that are rich in calcium include milk, cheese, and broccoli. Foods that are rich in folate include chickpeas, kidney beans, and spinach. HOME CARE INSTRUCTIONS Keep all follow-up appointments as directed by your caregiver. You may need periodic exams to check for polyps. SEEK MEDICAL CARE IF: You notice bleeding during a bowel movement.

## 2014-05-13 ENCOUNTER — Encounter (HOSPITAL_COMMUNITY): Payer: Self-pay | Admitting: Internal Medicine

## 2014-05-19 ENCOUNTER — Encounter: Payer: Self-pay | Admitting: Internal Medicine

## 2014-05-23 ENCOUNTER — Ambulatory Visit (INDEPENDENT_AMBULATORY_CARE_PROVIDER_SITE_OTHER): Payer: BC Managed Care – PPO | Admitting: Family Medicine

## 2014-05-23 ENCOUNTER — Ambulatory Visit (HOSPITAL_COMMUNITY)
Admission: RE | Admit: 2014-05-23 | Discharge: 2014-05-23 | Disposition: A | Discharge status: Home / Self Care | Payer: BC Managed Care – PPO | Source: Ambulatory Visit | Attending: Family Medicine | Admitting: Family Medicine

## 2014-05-23 ENCOUNTER — Other Ambulatory Visit (HOSPITAL_COMMUNITY)
Admission: RE | Admit: 2014-05-23 | Discharge: 2014-05-23 | Disposition: A | Discharge status: Home / Self Care | Payer: BC Managed Care – PPO | Source: Ambulatory Visit | Attending: Family Medicine | Admitting: Family Medicine

## 2014-05-23 ENCOUNTER — Other Ambulatory Visit: Payer: Self-pay | Admitting: Family Medicine

## 2014-05-23 ENCOUNTER — Encounter (INDEPENDENT_AMBULATORY_CARE_PROVIDER_SITE_OTHER): Payer: Self-pay

## 2014-05-23 ENCOUNTER — Encounter: Payer: Self-pay | Admitting: Family Medicine

## 2014-05-23 VITALS — BP 134/78 | HR 82 | Resp 18 | Wt 168.0 lb

## 2014-05-23 DIAGNOSIS — Z124 Encounter for screening for malignant neoplasm of cervix: Secondary | ICD-10-CM | POA: Insufficient documentation

## 2014-05-23 DIAGNOSIS — M47817 Spondylosis without myelopathy or radiculopathy, lumbosacral region: Secondary | ICD-10-CM | POA: Insufficient documentation

## 2014-05-23 DIAGNOSIS — M545 Low back pain, unspecified: Secondary | ICD-10-CM

## 2014-05-23 DIAGNOSIS — M546 Pain in thoracic spine: Secondary | ICD-10-CM

## 2014-05-23 DIAGNOSIS — M79605 Pain in left leg: Secondary | ICD-10-CM | POA: Insufficient documentation

## 2014-05-23 DIAGNOSIS — M79604 Pain in right leg: Secondary | ICD-10-CM

## 2014-05-23 DIAGNOSIS — Z0000 Encounter for general adult medical examination without abnormal findings: Secondary | ICD-10-CM

## 2014-05-23 DIAGNOSIS — Z1151 Encounter for screening for human papillomavirus (HPV): Secondary | ICD-10-CM | POA: Insufficient documentation

## 2014-05-23 DIAGNOSIS — M4120 Other idiopathic scoliosis, site unspecified: Secondary | ICD-10-CM | POA: Insufficient documentation

## 2014-05-23 DIAGNOSIS — M5090 Cervical disc disorder, unspecified, unspecified cervical region: Secondary | ICD-10-CM

## 2014-05-23 DIAGNOSIS — Z1211 Encounter for screening for malignant neoplasm of colon: Secondary | ICD-10-CM

## 2014-05-23 LAB — POC HEMOCCULT BLD/STL (OFFICE/1-CARD/DIAGNOSTIC): Fecal Occult Blood, POC: NEGATIVE

## 2014-05-23 MED ORDER — PREDNISONE (PAK) 5 MG PO TABS: 5.0000 mg | ORAL_TABLET | ORAL | Status: DC

## 2014-05-23 MED ORDER — KETOROLAC TROMETHAMINE 60 MG/2ML IM SOLN
60.0000 mg | Freq: Once | INTRAMUSCULAR | Status: AC
  Administered 2014-05-23: 60 mg via INTRAMUSCULAR

## 2014-05-23 MED ORDER — METHYLPREDNISOLONE ACETATE 80 MG/ML IJ SUSP
80.0000 mg | Freq: Once | INTRAMUSCULAR | Status: AC
  Administered 2014-05-23: 80 mg via INTRAMUSCULAR

## 2014-05-23 MED ORDER — GABAPENTIN 300 MG PO CAPS: ORAL_CAPSULE | ORAL | Status: DC

## 2014-05-23 NOTE — Patient Instructions (Signed)
F/u in 3.5 month, call if you need me before  You need to get xray of  mid and lower back today.  You are refered for MRI studies and will be called with appt within the next 3 to 5 days  Injections in office today for pain  New medication, prednisone fopr 6 days, and gabapentin at night for pain and to help with sleep are prescribed  You will be contacted with MRI results and further management determined after that

## 2014-05-23 NOTE — Progress Notes (Signed)
   Subjective:    Patient ID: Paula Randolph, female    DOB: [birth year], 53 y.o.   MRN: [id number]  HPI Here for physical  Uncontrolled low back and left lower leg, mid calf, at times the right leg hurts but not as bad. Weakness in left lower extremity noted and numbness noted in left thigh lateral aspect. Pt denies incontinence of stool, or urine. Also has pain in the neck   Review of Systems See HPI     Objective:   Physical Exam Pleasant well nourished female, alert and oriented x 3, in no cardio-pulmonary distress. Afebrile. HEENT No facial trauma or asymetry. Sinuses non tender.  EOMI, PERTL, fundoscopic exam l, no hemorhage or exudate.  External ears normal, tympanic membranes clear. Oropharynx moist, no exudate, fair dentition. Neck: supple, no adenopathy,JVD or thyromegaly.No bruits.  Chest: Clear to ascultation bilaterally.No crackles or wheezes. Non tender to palpation  Breast: No ,no masses. No nipple discharge or inversion.Right lumpectomy with scar No axillary or supraclavicular adenopathy  Cardiovascular system; Heart sounds normal,  S1 and  S2 ,no S3.  No murmur, or thrill. Apical beat not displaced Peripheral pulses normal.  Abdomen: Soft, non tender, no organomegaly or masses. No bruits. Bowel sounds normal. No guarding, tenderness or rebound.  Rectal:  No mass. Guaiac negative stool.  GU: External genitalia normal. No lesions. Vaginal canal atrophic changes, bleeding on contact.Physiologic white discharge. Uterus absent, no adnexal masses, no  adnexal tenderness.  Musculoskeletal exam: Decreased ROM of spine,and left hip ,adequate in  shoulders and knees. No deformity ,swelling or crepitus noted. No muscle wasting or atrophy.   Neurologic: Cranial nerves 2 to 12 intact. Power decreased in left lower extremity, power is grade 4 in left lower extremity, grade 5 power in right lower extremity and in both upper extremities. Decreased sensation  in left thigh lateral aspect, otherwise normal throughout. No disturbance in gait. No tremor.  Skin: Intact, no ulceration, erythema , scaling or rash noted. Pigmentation normal throughout  Psych; Normal mood and affect. Judgement and concentration normal        Assessment & Plan:  Annual physical exam Annual exam as documented. Counseling done  re healthy lifestyle involving commitment to 150 minutes exercise per week, heart healthy diet, and attaining healthy weight.The importance of adequate sleep also discussed. Regular seat belt use  is also discussed. Changes in health habits are decided on by the patient with goals and time frames  set for achieving them. Immunization and cancer screening needs are specifically addressed at this visit.   Lumbar pain with radiation down both legs Disabling low back pain radiating to left leg with weakness and numbness, anti inflammatories in office and orally and imaging studies  Thoracic spine pain Disabling mid back pain with radiation, anti inflammatories and imaging

## 2014-05-24 DIAGNOSIS — M5090 Cervical disc disorder, unspecified, unspecified cervical region: Secondary | ICD-10-CM | POA: Insufficient documentation

## 2014-05-25 DIAGNOSIS — Z0000 Encounter for general adult medical examination without abnormal findings: Secondary | ICD-10-CM | POA: Insufficient documentation

## 2014-05-25 NOTE — Assessment & Plan Note (Signed)
Annual exam as documented. Counseling done  re healthy lifestyle involving commitment to 150 minutes exercise per week, heart healthy diet, and attaining healthy weight.The importance of adequate sleep also discussed. Regular seat belt use  is also discussed. Changes in health habits are decided on by the patient with goals and time frames  set for achieving them. Immunization and cancer screening needs are specifically addressed at this visit.

## 2014-05-25 NOTE — Assessment & Plan Note (Signed)
Disabling low back pain radiating to left leg with weakness and numbness, anti inflammatories in office and orally and imaging studies

## 2014-05-25 NOTE — Assessment & Plan Note (Signed)
Disabling mid back pain with radiation, anti inflammatories and imaging

## 2014-05-28 ENCOUNTER — Ambulatory Visit (HOSPITAL_COMMUNITY): Payer: BC Managed Care – PPO

## 2014-05-30 ENCOUNTER — Ambulatory Visit (HOSPITAL_COMMUNITY): Payer: BC Managed Care – PPO

## 2014-05-30 ENCOUNTER — Ambulatory Visit (HOSPITAL_COMMUNITY)
Admission: RE | Admit: 2014-05-30 | Discharge: 2014-05-30 | Disposition: A | Discharge status: Home / Self Care | Payer: BC Managed Care – PPO | Source: Ambulatory Visit | Attending: Family Medicine | Admitting: Family Medicine

## 2014-05-30 DIAGNOSIS — M79604 Pain in right leg: Secondary | ICD-10-CM

## 2014-05-30 DIAGNOSIS — M545 Low back pain, unspecified: Secondary | ICD-10-CM | POA: Insufficient documentation

## 2014-05-30 DIAGNOSIS — M51379 Other intervertebral disc degeneration, lumbosacral region without mention of lumbar back pain or lower extremity pain: Secondary | ICD-10-CM | POA: Insufficient documentation

## 2014-05-30 DIAGNOSIS — M5137 Other intervertebral disc degeneration, lumbosacral region: Secondary | ICD-10-CM | POA: Insufficient documentation

## 2014-05-30 DIAGNOSIS — M5126 Other intervertebral disc displacement, lumbar region: Secondary | ICD-10-CM | POA: Insufficient documentation

## 2014-05-30 DIAGNOSIS — M5090 Cervical disc disorder, unspecified, unspecified cervical region: Secondary | ICD-10-CM

## 2014-05-30 DIAGNOSIS — Q762 Congenital spondylolisthesis: Secondary | ICD-10-CM | POA: Insufficient documentation

## 2014-05-30 DIAGNOSIS — M47817 Spondylosis without myelopathy or radiculopathy, lumbosacral region: Secondary | ICD-10-CM | POA: Insufficient documentation

## 2014-05-31 ENCOUNTER — Ambulatory Visit (HOSPITAL_COMMUNITY)
Admission: RE | Admit: 2014-05-31 | Discharge: 2014-05-31 | Disposition: A | Discharge status: Home / Self Care | Payer: BC Managed Care – PPO | Source: Ambulatory Visit | Attending: Family Medicine | Admitting: Family Medicine

## 2014-05-31 ENCOUNTER — Ambulatory Visit (HOSPITAL_COMMUNITY): Payer: BC Managed Care – PPO

## 2014-05-31 DIAGNOSIS — M546 Pain in thoracic spine: Secondary | ICD-10-CM

## 2014-05-31 DIAGNOSIS — IMO0002 Reserved for concepts with insufficient information to code with codable children: Secondary | ICD-10-CM | POA: Insufficient documentation

## 2014-05-31 DIAGNOSIS — M5124 Other intervertebral disc displacement, thoracic region: Secondary | ICD-10-CM | POA: Insufficient documentation

## 2014-05-31 DIAGNOSIS — M47814 Spondylosis without myelopathy or radiculopathy, thoracic region: Secondary | ICD-10-CM | POA: Insufficient documentation

## 2014-06-03 ENCOUNTER — Ambulatory Visit (HOSPITAL_COMMUNITY): Payer: BC Managed Care – PPO

## 2014-06-04 ENCOUNTER — Other Ambulatory Visit: Payer: Self-pay

## 2014-06-04 DIAGNOSIS — M5090 Cervical disc disorder, unspecified, unspecified cervical region: Secondary | ICD-10-CM

## 2014-06-04 DIAGNOSIS — M545 Low back pain: Secondary | ICD-10-CM

## 2014-06-04 DIAGNOSIS — M79604 Pain in right leg: Secondary | ICD-10-CM

## 2014-07-29 ENCOUNTER — Telehealth: Payer: Self-pay | Admitting: Family Medicine

## 2014-07-30 NOTE — Telephone Encounter (Signed)
noted 

## 2014-08-01 ENCOUNTER — Other Ambulatory Visit: Payer: Self-pay | Admitting: Family Medicine

## 2014-08-01 ENCOUNTER — Other Ambulatory Visit: Payer: Self-pay

## 2014-08-01 ENCOUNTER — Telehealth: Payer: Self-pay | Admitting: Family Medicine

## 2014-08-01 DIAGNOSIS — I10 Essential (primary) hypertension: Secondary | ICD-10-CM

## 2014-08-01 MED ORDER — TRIAMTERENE-HCTZ 37.5-25 MG PO TABS: 1.0000 | ORAL_TABLET | Freq: Every day | ORAL | Status: DC

## 2014-08-01 NOTE — Telephone Encounter (Signed)
Med refilled.

## 2014-09-03 ENCOUNTER — Ambulatory Visit (INDEPENDENT_AMBULATORY_CARE_PROVIDER_SITE_OTHER): Payer: BC Managed Care – PPO | Admitting: Family Medicine

## 2014-09-03 ENCOUNTER — Encounter: Payer: Self-pay | Admitting: Family Medicine

## 2014-09-03 VITALS — BP 134/84 | HR 72 | Resp 18 | Wt 166.0 lb

## 2014-09-03 DIAGNOSIS — R5383 Other fatigue: Secondary | ICD-10-CM

## 2014-09-03 DIAGNOSIS — F32A Depression, unspecified: Secondary | ICD-10-CM

## 2014-09-03 DIAGNOSIS — Z23 Encounter for immunization: Secondary | ICD-10-CM | POA: Insufficient documentation

## 2014-09-03 DIAGNOSIS — M5090 Cervical disc disorder, unspecified, unspecified cervical region: Secondary | ICD-10-CM

## 2014-09-03 DIAGNOSIS — F3289 Other specified depressive episodes: Secondary | ICD-10-CM

## 2014-09-03 DIAGNOSIS — F329 Major depressive disorder, single episode, unspecified: Secondary | ICD-10-CM

## 2014-09-03 DIAGNOSIS — E785 Hyperlipidemia, unspecified: Secondary | ICD-10-CM

## 2014-09-03 DIAGNOSIS — I10 Essential (primary) hypertension: Secondary | ICD-10-CM

## 2014-09-03 DIAGNOSIS — R5381 Other malaise: Secondary | ICD-10-CM

## 2014-09-03 MED ORDER — MIRTAZAPINE 30 MG PO TABS: 30.0000 mg | ORAL_TABLET | Freq: Every day | ORAL | Status: DC

## 2014-09-03 NOTE — Assessment & Plan Note (Signed)
Controlled, no change in medication DASH diet and commitment to daily physical activity for a minimum of 30 minutes discussed and encouraged, as a part of hypertension management. The importance of attaining a healthy weight is also discussed.  

## 2014-09-03 NOTE — Assessment & Plan Note (Signed)
Recently had1 epidural with some improvement in symptom improvement , but conmtinues to experience disbaling lower extremity pain, has f/u with pain clinic

## 2014-09-03 NOTE — Progress Notes (Signed)
   Subjective:    Patient ID: Paula Randolph, female    DOB: 01/09/1960, 53 y.o.   MRN: [id number]  HPI The PT is here for follow up and re-evaluation of chronic medical conditions, medication management and review of any available recent lab and radiology data.  Preventive health is updated, specifically  Cancer screening and Immunization.   Questions or concerns regarding consultations or procedures which the PT has had in the interim are  addressed. The PT denies any adverse reactions to current medications since the last visit. No improvement in depression with remeron low dose. Stressed by job, and demands from her siblings There are no new concerns.      Review of Systems See HPI Denies recent fever or chills. Denies sinus pressure, nasal congestion, ear pain or sore throat. Denies chest congestion, productive cough or wheezing. Denies chest pains, palpitations and leg swelling Denies abdominal pain, nausea, vomiting,diarrhea or constipation.   Denies dysuria, frequency, hesitancy or incontine c/o chronic and uncontrolled back pain radiating to extremities, she has had 1 epidural to date. Denies headaches, seizures,has  ext numbness, and  tingling. C/o popor appetite, no energy, poor sleep and uncontrolled depression Denies skin break down or rash.        Objective:   Physical Exam BP 134/84  Pulse 72  Resp 18  Wt 166 lb (75.297 kg)  SpO2 100% Patient alert and oriented and in no cardiopulmonary distress.  HEENT: No facial asymmetry, EOMI,   oropharynx pink and moist.  Neck supple no JVD, no mass.  Chest: Clear to auscultation bilaterally.  CVS: S1, S2 no murmurs, no S3.Regular rate.  ABD: Soft non tender.   Ext: No edema  MS: Adequate ROM spine, shoulders, hips and knees.  Skin: Intact, no ulcerations or rash noted.  Psych: Good eye contact, flat  affect. Memory intact not anxious but  depressed appearing, and becomes tearful  CNS: CN 2-12 intact, power,   normal throughout.no focal deficits noted.        Assessment & Plan:  HYPERTENSION Controlled, no change in medication DASH diet and commitment to daily physical activity for a minimum of 30 minutes discussed and encouraged, as a part of hypertension management. The importance of attaining a healthy weight is also discussed.   Cervical neck pain with evidence of disc disease Recently had1 epidural with some improvement in symptom improvement , but conmtinues to experience disbaling lower extremity pain, has f/u with pain clinic  Depression Deteriorated and untreated. Increase remeron to30 mg and pt referred to therapist.She is interested in therapy Not currently suicidal or homicidal, states about 1 year ago she felt like this at times but had no plans  DYSLIPIDEMIA Hyperlipidemia:Low fat diet discussed and encouraged.  Updated lab withni the next 1 weeks.   Need for Tdap vaccination Vaccine administered at visit.   Need for prophylactic vaccination and inoculation against influenza Vaccine administered at visit.

## 2014-09-03 NOTE — Assessment & Plan Note (Signed)
Vaccine administered at visit.  

## 2014-09-03 NOTE — Assessment & Plan Note (Signed)
Deteriorated and untreated. Increase remeron to30 mg and pt referred to therapist.She is interested in therapy Not currently suicidal or homicidal, states about 1 year ago she felt like this at times but had no plans

## 2014-09-03 NOTE — Assessment & Plan Note (Signed)
Hyperlipidemia:Low fat diet discussed and encouraged.  Updated lab withni the next 1 weeks.

## 2014-09-03 NOTE — Patient Instructions (Signed)
F/u in 2 month, xcall if you need me before  Increased dose of remeron for depression to 30mg  one at bedtime, and you are also referred for counseling  Fasting lipid, chem 7 , TSH and CBC, next week Monday  TdAP and flu vaccine today  I hope you start feeling better soon

## 2014-09-04 ENCOUNTER — Ambulatory Visit (HOSPITAL_COMMUNITY)
Admission: RE | Admit: 2014-09-04 | Discharge: 2014-09-04 | Disposition: A | Discharge status: Home / Self Care | Payer: BC Managed Care – PPO | Source: Ambulatory Visit | Attending: Family Medicine | Admitting: Family Medicine

## 2014-09-04 DIAGNOSIS — R262 Difficulty in walking, not elsewhere classified: Secondary | ICD-10-CM | POA: Diagnosis not present

## 2014-09-04 DIAGNOSIS — M2569 Stiffness of other specified joint, not elsewhere classified: Secondary | ICD-10-CM | POA: Diagnosis not present

## 2014-09-04 DIAGNOSIS — M545 Low back pain, unspecified: Secondary | ICD-10-CM | POA: Insufficient documentation

## 2014-09-04 DIAGNOSIS — M6281 Muscle weakness (generalized): Secondary | ICD-10-CM | POA: Insufficient documentation

## 2014-09-04 DIAGNOSIS — M546 Pain in thoracic spine: Secondary | ICD-10-CM

## 2014-09-04 DIAGNOSIS — IMO0001 Reserved for inherently not codable concepts without codable children: Secondary | ICD-10-CM | POA: Diagnosis present

## 2014-09-04 DIAGNOSIS — M25659 Stiffness of unspecified hip, not elsewhere classified: Secondary | ICD-10-CM

## 2014-09-04 DIAGNOSIS — M549 Dorsalgia, unspecified: Secondary | ICD-10-CM | POA: Insufficient documentation

## 2014-09-04 DIAGNOSIS — M542 Cervicalgia: Secondary | ICD-10-CM

## 2014-09-04 DIAGNOSIS — M2560 Stiffness of unspecified joint, not elsewhere classified: Secondary | ICD-10-CM

## 2014-09-04 NOTE — Evaluation (Signed)
Physical Therapy Evaluation  Patient Details  Name: Paula Randolph MRN: [id number] Date of Birth: 1961/04/05  Today's Date: 09/04/2014 Time: 1500-1600 PT Time Calculation (min): 60 min    Charges: 1 Evaluation, TherEx 1535-1600          Visit#: 1 of 16  Re-eval: 10/04/14 Assessment Diagnosis: Back and neck pain.  Next MD Visit: Alden Hipp 09/25/14 Prior Therapy: no  Authorization: [employer]     Past Medical History:  Past Medical History  Diagnosis Date  . Hypertension   . Vitamin D deficiency   . Dyslipidemia   . Overweight(278.02)   . Allergy   . Primary osteoarthritis of both knees   . Right arm pain   . Trochanteric bursitis   . Strain of lumbar spine   . Cancer     right breast    Past Surgical History:  Past Surgical History  Procedure Laterality Date  . Right breast     . Mastectomy    . Appendectomy    . Abdominal hysterectomy      fibroids  . Breast surgery Right 2008    partial  . Colonoscopy N/A 05/10/2014    Procedure: COLONOSCOPY;  Surgeon: Daneil Dolin, MD;  Location: [facility];  Service: Endoscopy;  Laterality: N/A;  8:30 AM    Subjective Symptoms/Limitations Symptoms: pain. head acges, no numbness and tingling Pertinent History: patient has had pain in back and neck for the past 6 months. Patient recieved an injection 8/31/125 from Dr. Alden Hipp which made a difference in her pain. pain is along midline. 7 years ago patient diagnosed with breast cancer, high blood pressure,  How long can you sit comfortably?: 71min How long can you stand comfortably?: <19minutes How long can you walk comfortably?: <71minutes Patient Stated Goals: to return to regular exercise Pain Assessment Currently in Pain?: Yes Pain Score: 8  Pain Location: Back Pain Orientation: Posterior;Medial Pain Onset: More than a month ago Pain Frequency: Intermittent Pain Relieving Factors: injection, sitting in hot tub, pain medication Effect of Pain on Daily Activities: pain with  workign, bendng over and lifting.   Cognition/Observation Observation/Other Assessments Observations: Gait: excessive toe out, limited hip mobility  in all three planes, limited tibial internal rotation, excessive foot arch collapse, scissoring on Lt/Rt legs with notable weakness in hip abductors.   Sensation/Coordination/Flexibility/Functional Tests Flexibility Thomas: Positive 90/90: Positive (Lt only) Functional Tests Functional Tests: FOTO: 46% limited Functional Tests: positive pirifomris ttest bilaterally.  Functional Tests: positive straight leg raise test for trunk instability.   Assessment RLE Strength RLE Overall Strength Comments: positiive ely' test.  Right Hip Flexion: 3+/5 Right Hip Extension: 2+/5 Right Hip External Rotation : 25 Right Hip Internal Rotation : 35 Right Knee Flexion: 5/5 Right Knee Extension: 4/5 Right Ankle Dorsiflexion: 5/5 LLE AROM (degrees) LLE Overall AROM Comments: poitiive ely's test LLE Strength Left Hip Flexion: 3+/5 Left Hip Extension: 2+/5 Left Hip External Rotation : 10 Left Hip Internal Rotation : 25 Left Knee Flexion: 3+/5 Left Knee Extension: 3+/5 Left Ankle Dorsiflexion: 4/5 Cervical AROM Cervical Flexion: 70 Cervical Extension: 70 Cervical - Right Side Bend: 30 Cervical - Left Side Bend: 42 Cervical - Right Rotation: 57 Cervical - Left Rotation: 60 Lumbar AROM Lumbar Flexion: 90 pain increases with increased degrees of motions Lumbar Extension: 33 minor increase in pain 0 Lumbar - Right Side Bend: WNL Lumbar - Left Side Bend: WNL increases pain Lumbar - Right Rotation: WNL pain at end range Lumbar - Left Rotation: WNL pain at  end range  Exercise/Treatments Stretches Active Hamstring Stretch: Limitations Active Hamstring Stretch Limitations: 10x 3 seconds to 14" Hip Flexor Stretch: Limitations Hip Flexor Stretch Limitations: 10x 3 seconds to 14" Piriformis Stretch: 2 reps;10 seconds Standing Other Standing Lumbar  Exercises: calf stretch 5x 3", groin stretch 5x 3" to 14"  Other Standing Lumbar Exercises: 3D hip excursion 10x  Physical Therapy Assessment and Plan PT Assessment and Plan Clinical Impression Statement: Patient displasy spinal pain and stiffness from cervical spine through thoracic and lumbar spine secondary to weakness in the abdominal muslce resultign in limited stability. Contributing factors include abnormal gait secondary to limited hip and garstroc mobility and LE weakness (Lt weaker than Rt).  Patient will benefit from skilled phsyical therapy to address the above listed limitation to return to prior level of function with  Pt will benefit from skilled therapeutic intervention in order to improve on the following deficits: Abnormal gait;Decreased activity tolerance;Difficulty walking;Decreased strength;Decreased range of motion;Decreased mobility;Increased fascial restricitons;Increased muscle spasms;Impaired flexibility;Pain;Improper spinal/pelvic alignment;Improper body mechanics Rehab Potential: Good Clinical Impairments Affecting Rehab Potential: WNL PT Frequency: Min 2X/week PT Duration: 8 weeks PT Treatment/Interventions: Gait training;Stair training;Functional mobility training;Therapeutic activities;Therapeutic exercise;Neuromuscular re-education;Modalities;Manual techniques;Patient/family education PT Plan: Initial focus on reestablishing full LE mobility progressing to strengthening of abdominal and LE muscles to decrease strain on low back with lifting which is required for patients job. Conitnue stretches next session with inroduction of prone extension and quad  stretches and initiate abdominal strengthening exercises.     Goals Home Exercise Program Pt/caregiver will Perform Home Exercise Program: For increased ROM PT Goal: Perform Home Exercise Program - Progress: Goal set today PT Short Term Goals Time to Complete Short Term Goals: 4 weeks PT Short Term Goal 1: Patient  will increase hip internal rotation to 30 degrees to increase shock absorbtion during gait. PT Short Term Goal 2: Patient will increase hip extesnion to 15 degrees to increase stride elngth durign gait PT Short Term Goal 3: Patient will increase cervical spine side bending to 45 degrees bilaterally to hold a phone against her shoudler PT Short Term Goal 4: Patient will increase cervical spine rotation to >70 degrees bilaterally to increase ability to look over shoulder while driving.  PT Short Term Goal 5: Patient will be able to extend > 30 degrees withtou pain PT Long Term Goals Time to Complete Long Term Goals: 8 weeks PT Long Term Goal 1: patient will be able to bend down to touch toes without pain PT Long Term Goal 2: Patient will be able to demsontrate a 4/5 MMT for trunk flexion indicating improved abdominal strength Long Term Goal 3: Patient will be able to lift 10lb from floor without pain and with good technque 10 consecutive repetitions to be able to return to work without pain.  Long Term Goal 4: Patient will be able to walk, stand, sit > 1 hour without pain.   Problem List Patient Active Problem List   Diagnosis Date Noted  . Stiffness of joint, not elsewhere classified, pelvic region and thigh 09/04/2014  . Muscle weakness (generalized) 09/04/2014  . Pain in thoracic spine 09/04/2014  . Lumbago 09/04/2014  . Cervicalgia 09/04/2014  . Need for prophylactic vaccination and inoculation against influenza 09/03/2014  . Need for Tdap vaccination 09/03/2014  . Annual physical exam 05/25/2014  . Cervical neck pain with evidence of disc disease 05/24/2014  . Thoracic spine pain 05/23/2014  . Lumbar pain with radiation down both legs 05/23/2014  . Depression 03/26/2014  .  SHINGLES 02/02/2010  . VITAMIN D DEFICIENCY 02/02/2010  . ELBOW PAIN, RIGHT 02/02/2010  . FATIGUE 02/02/2010  . OVERWEIGHT 04/20/2009  . DYSLIPIDEMIA 01/30/2009  . ALLERGIC RHINITIS CAUSE UNSPECIFIED 01/30/2009   . NEOPLASM, MALIGNANT, BREAST, RIGHT 04/12/2008  . OSTEOARTHRITIS, KNEES, BILATERAL 04/12/2008  . HYPERTENSION 01/05/2008    PT - End of Session Activity Tolerance: Patient tolerated treatment well General Behavior During Therapy: WFL for tasks assessed/performed PT Plan of Care PT Home Exercise Plan: piriformis, hip flexor, hamstring, calf, groin stretches   GP    Taygen Newsome R 09/04/2014, 4:29 PM  Physician Documentation Your signature is required to indicate approval of the treatment plan as stated above.  Please sign and either send electronically or make a copy of this report for your files and return this physician signed original.   Please mark one 1.__approve of plan  2. ___approve of plan with the following conditions.   ______________________________                                                          _____________________ Physician Signature                                                                                                             Date

## 2014-09-06 ENCOUNTER — Ambulatory Visit (HOSPITAL_COMMUNITY)
Admission: RE | Admit: 2014-09-06 | Discharge: 2014-09-06 | Disposition: A | Discharge status: Home / Self Care | Payer: BC Managed Care – PPO | Source: Ambulatory Visit | Attending: Family Medicine | Admitting: Family Medicine

## 2014-09-06 DIAGNOSIS — IMO0001 Reserved for inherently not codable concepts without codable children: Secondary | ICD-10-CM | POA: Diagnosis not present

## 2014-09-06 NOTE — Progress Notes (Signed)
Physical Therapy Treatment Patient Details  Name: Paula Randolph MRN: [id number] Date of Birth: 07/04/1961  Today's Date: 09/06/2014 Time: 6301-6010 PT Time Calculation (min): 40 min TE [id number]  Visit#: 2 of 16  Re-eval: 10/04/14 Assessment Diagnosis: Back and neck pain.  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  Pain Assessment Currently in Pain?: Yes Pain Score: 7  Pain Location: Back Pain Orientation: Left;Lower   Exercise/Treatments Stretches Active Hamstring Stretch: 3 reps;20 seconds;Limitations Active Hamstring Stretch Limitations: 14" Box Hip Flexor Stretch: 2 reps;20 seconds Hip Flexor Stretch Limitations: Groin Stretch, 14" Box for both Prone on Elbows Stretch: 5 reps;10 seconds Quad Stretch: 3 reps;20 seconds;Limitations Quad Stretch Limitations: Prone with rope Piriformis Stretch: 3 reps;20 seconds;Limitations Piriformis Stretch Limitations: Seated Standing Other Standing Lumbar Exercises: Gastroc Stretch, Slantboard, 30" x3 Supine Ab Set: 5 reps Bridge: 10 reps Straight Leg Raise: 10 reps Sidelying Hip Abduction: 10 reps Prone Straight Leg Raise: 10 reps        Physical Therapy Assessment and Plan PT Assessment and Plan Clinical Impression Statement: Initiated therapuetic exercise program focusing on ROM/stretching exercises and trunk/hip stabilization strengthening program.  Pt reports increased difficulty with strengthening exercises on the Lt LE compared to Rt secondary to weakness, and some pulling into Lt sided low back.   No complaints of neck pain today, so cervical exercises not addressed this session though will address as able.  Pt reported she will apply ice at home.  Pt will benefit from skilled therapeutic intervention in order to improve on the following deficits: Abnormal gait;Decreased activity  tolerance;Difficulty walking;Decreased strength;Decreased range of motion;Decreased mobility;Increased fascial restricitons;Increased muscle spasms;Impaired flexibility;Pain;Improper spinal/pelvic alignment;Improper body mechanics Rehab Potential: Good PT Frequency: Min 2X/week PT Duration: 8 weeks PT Plan: Progress stretching and strengthening program to tolerance.  Add core stability exercises for increased abdominal strengthening.   Add cervical stretches exercises for neck pain if needed.     Goals PT Short Term Goals PT Short Term Goal 1: Patient will increase hip internal rotation to 30 degrees to increase shock absorbtion during gait. PT Short Term Goal 1 - Progress: Progressing toward goal PT Short Term Goal 2: Patient will increase hip extesnion to 15 degrees to increase stride elngth durign gait PT Short Term Goal 2 - Progress: Progressing toward goal PT Long Term Goals PT Long Term Goal 1: patient will be able to bend down to touch toes without pain PT Long Term Goal 1 - Progress: Progressing toward goal PT Long Term Goal 2: Patient will be able to demsontrate a 4/5 MMT for trunk flexion indicating improved abdominal strength PT Long Term Goal 2 - Progress: Progressing toward goal  Problem List Patient Active Problem List   Diagnosis Date Noted  . Stiffness of joint, not elsewhere classified, pelvic region and thigh 09/04/2014  . Muscle weakness (generalized) 09/04/2014  . Pain in thoracic spine 09/04/2014  . Lumbago 09/04/2014  . Cervicalgia 09/04/2014  . Need for prophylactic vaccination and inoculation against influenza 09/03/2014  . Need for Tdap vaccination 09/03/2014  . Annual physical exam 05/25/2014  . Cervical neck pain with evidence of disc disease 05/24/2014  . Thoracic spine pain 05/23/2014  . Lumbar pain with radiation down both legs 05/23/2014  . Depression 03/26/2014  . SHINGLES 02/02/2010  . VITAMIN D DEFICIENCY 02/02/2010  . ELBOW PAIN, RIGHT 02/02/2010   . FATIGUE 02/02/2010  . OVERWEIGHT  04/20/2009  . DYSLIPIDEMIA 01/30/2009  . ALLERGIC RHINITIS CAUSE UNSPECIFIED 01/30/2009  . NEOPLASM, MALIGNANT, BREAST, RIGHT 04/12/2008  . OSTEOARTHRITIS, KNEES, BILATERAL 04/12/2008  . HYPERTENSION 01/05/2008    PT - End of Session Activity Tolerance: Patient tolerated treatment well General Behavior During Therapy: [facility] for tasks assessed/performed  GP    Alaster Asfaw 09/06/2014, 9:33 AM

## 2014-09-09 LAB — CBC
HCT: 34.7 % — ABNORMAL LOW (ref 36.0–46.0)
HEMOGLOBIN: 10.8 g/dL — AB (ref 12.0–15.0)
MCH: 26.5 pg (ref 26.0–34.0)
MCHC: 31.1 g/dL (ref 30.0–36.0)
MCV: 85 fL (ref 78.0–100.0)
Platelets: 392 10*3/uL (ref 150–400)
RBC: 4.08 MIL/uL (ref 3.87–5.11)
RDW: 13 % (ref 11.5–15.5)
WBC: 7 10*3/uL (ref 4.0–10.5)

## 2014-09-09 LAB — BASIC METABOLIC PANEL
BUN: 14 mg/dL (ref 6–23)
CALCIUM: 9.1 mg/dL (ref 8.4–10.5)
CO2: 30 meq/L (ref 19–32)
Chloride: 105 mEq/L (ref 96–112)
Creat: 0.58 mg/dL (ref 0.50–1.10)
GLUCOSE: 82 mg/dL (ref 70–99)
Potassium: 4 mEq/L (ref 3.5–5.3)
Sodium: 140 mEq/L (ref 135–145)

## 2014-09-09 LAB — LIPID PANEL
CHOLESTEROL: 187 mg/dL (ref 0–200)
HDL: 70 mg/dL (ref >39)
LDL Cholesterol: 101 mg/dL — ABNORMAL HIGH (ref 0–99)
Total CHOL/HDL Ratio: 2.7 Ratio
Triglycerides: 82 mg/dL (ref <150)
VLDL: 16 mg/dL (ref 0–40)

## 2014-09-09 LAB — TSH: TSH: 1.017 u[IU]/mL (ref 0.350–4.500)

## 2014-09-10 ENCOUNTER — Ambulatory Visit (HOSPITAL_COMMUNITY)
Admission: RE | Admit: 2014-09-10 | Discharge: 2014-09-10 | Disposition: A | Discharge status: Home / Self Care | Payer: BC Managed Care – PPO | Source: Ambulatory Visit | Attending: Anesthesiology | Admitting: Anesthesiology

## 2014-09-10 DIAGNOSIS — IMO0001 Reserved for inherently not codable concepts without codable children: Secondary | ICD-10-CM | POA: Diagnosis not present

## 2014-09-10 NOTE — Progress Notes (Signed)
Physical Therapy Treatment Patient Details  Name: Paula Randolph MRN: [id number] Date of Birth: 15-Oct-1961  Today's Date: 09/10/2014 Time: 6010-9323 PT Time Calculation (min): 43 min 19' TE  Visit#: 3 of 16  Re-eval: 10/04/14    Authorization:    Authorization Time Period:    Authorization Visit#:   of     Subjective: Symptoms/Limitations Symptoms: Patient does not complain of pain today, only soreness of Left low back (no radiating pain present today)   Exercise/Treatments       Stretches Active Hamstring Stretch: 3 reps;Limitations;30 seconds Active Hamstring Stretch Limitations: 14" Box Hip Flexor Stretch: 2 reps;30 seconds Hip Flexor Stretch Limitations: Groin Stretch, 14" Box for both Prone on Elbows Stretch: 5 reps;10 seconds Quad Stretch: 3 reps;Limitations;30 seconds Quad Stretch Limitations: Prone with rope Piriformis Stretch: 3 reps;Limitations;30 seconds Piriformis Stretch Limitations: Seated   Supine Clam: 10 reps Bridge: 10 reps Other Supine Lumbar Exercises: hip adduction with pillow squeeze 10 x10" Other Supine Lumbar Exercises: self ME techinque of double bent knee with simultaneous hip flex/ext 5" hold each LLE Sidelying   Prone  Straight Leg Raise: 5 reps;2 seconds     Physical Therapy Assessment and Plan PT Assessment and Plan PT Plan: Progress stretching and strengthening program to tolerance.  Add core stability exercises for increased abdominal strengthening.   Add cervical stretches exercises for neck pain if needed.     Goals    Problem List Patient Active Problem List   Diagnosis Date Noted  . Stiffness of joint, not elsewhere classified, pelvic region and thigh 09/04/2014  . Muscle weakness (generalized) 09/04/2014  . Pain in thoracic spine 09/04/2014  . Lumbago 09/04/2014  . Cervicalgia 09/04/2014  . Need for prophylactic vaccination and inoculation against influenza 09/03/2014  . Need for Tdap vaccination 09/03/2014  .  Annual physical exam 05/25/2014  . Cervical neck pain with evidence of disc disease 05/24/2014  . Thoracic spine pain 05/23/2014  . Lumbar pain with radiation down both legs 05/23/2014  . Depression 03/26/2014  . SHINGLES 02/02/2010  . VITAMIN D DEFICIENCY 02/02/2010  . ELBOW PAIN, RIGHT 02/02/2010  . FATIGUE 02/02/2010  . OVERWEIGHT 04/20/2009  . DYSLIPIDEMIA 01/30/2009  . ALLERGIC RHINITIS CAUSE UNSPECIFIED 01/30/2009  . NEOPLASM, MALIGNANT, BREAST, RIGHT 04/12/2008  . OSTEOARTHRITIS, KNEES, BILATERAL 04/12/2008  . HYPERTENSION 01/05/2008       GP    [place], [place] 09/10/2014, 6:03 PM

## 2014-09-12 ENCOUNTER — Ambulatory Visit (HOSPITAL_COMMUNITY)
Admission: RE | Admit: 2014-09-12 | Discharge: 2014-09-12 | Disposition: A | Discharge status: Home / Self Care | Payer: BC Managed Care – PPO | Source: Ambulatory Visit | Attending: Family Medicine | Admitting: Family Medicine

## 2014-09-12 DIAGNOSIS — IMO0001 Reserved for inherently not codable concepts without codable children: Secondary | ICD-10-CM | POA: Diagnosis not present

## 2014-09-12 NOTE — Progress Notes (Signed)
Physical Therapy Treatment Patient Details  Name: Paula Randolph MRN: [id number] Date of Birth: December 27, 1961  Today's Date: 09/12/2014 Time: 6333-5456 PT Time Calculation (min): 44 min Charge:  There ex [phone]  Visit#: 4 of 16  Re-eval: 10/04/14    Authorization: BCBS  Subjective: Symptoms/Limitations Symptoms: Pt states that her neck feels fine but she is having low back pain  Pain Assessment Currently in Pain?: Yes Pain Score: 6  Pain Orientation: Left;Lower     Exercise/Treatments      Stretches Active Hamstring Stretch: 3 reps;30 seconds Active Hamstring Stretch Limitations: 14" Box Press Ups: 5 reps Piriformis Stretch: Limitations;60 seconds Piriformis Stretch Limitations: quadriped    Standing Other Standing Lumbar Exercises: 3 D back excursion  Seated Sit to Stand: 5 reps Supine Bent Knee Raise: 10 reps Bridge: 10 reps Sidelying Hip Abduction: 10 reps Prone  Straight Leg Raise: 10 reps Other Prone Lumbar Exercises: heel squeeze x 10  Quadruped Madcat/Old Horse: 5 reps     Physical Therapy Assessment and Plan PT Assessment and Plan Clinical Impression Statement: Added new exercises with verbal and manual cuing for proper technique.  Pt verbalized decreased pain after session  PT Plan: begin standing functional squat to progress to proper lifting as well as heel raises and lunging exercises     Goals  progressing   Problem List Patient Active Problem List   Diagnosis Date Noted  . Stiffness of joint, not elsewhere classified, pelvic region and thigh 09/04/2014  . Muscle weakness (generalized) 09/04/2014  . Pain in thoracic spine 09/04/2014  . Lumbago 09/04/2014  . Cervicalgia 09/04/2014  . Need for prophylactic vaccination and inoculation against influenza 09/03/2014  . Need for Tdap vaccination 09/03/2014  . Annual physical exam 05/25/2014  . Cervical neck pain with evidence of disc disease 05/24/2014  . Thoracic spine pain 05/23/2014  .  Lumbar pain with radiation down both legs 05/23/2014  . Depression 03/26/2014  . SHINGLES 02/02/2010  . VITAMIN D DEFICIENCY 02/02/2010  . ELBOW PAIN, RIGHT 02/02/2010  . FATIGUE 02/02/2010  . OVERWEIGHT 04/20/2009  . DYSLIPIDEMIA 01/30/2009  . ALLERGIC RHINITIS CAUSE UNSPECIFIED 01/30/2009  . NEOPLASM, MALIGNANT, BREAST, RIGHT 04/12/2008  . OSTEOARTHRITIS, KNEES, BILATERAL 04/12/2008  . HYPERTENSION 01/05/2008       GP    RUSSELL,CINDY 09/12/2014, 9:39 AM

## 2014-09-16 ENCOUNTER — Ambulatory Visit (HOSPITAL_COMMUNITY)
Admission: RE | Admit: 2014-09-16 | Discharge: 2014-09-16 | Disposition: A | Discharge status: Home / Self Care | Payer: BC Managed Care – PPO | Source: Ambulatory Visit | Attending: Anesthesiology | Admitting: Anesthesiology

## 2014-09-16 DIAGNOSIS — IMO0001 Reserved for inherently not codable concepts without codable children: Secondary | ICD-10-CM | POA: Diagnosis not present

## 2014-09-16 NOTE — Progress Notes (Signed)
Physical Therapy Treatment Patient Details  Name: Paula Randolph MRN: [id number] Date of Birth: 09-18-1961  Today's Date: 09/16/2014 Time: 2703-5009 PT Time Calculation (min): 40 min Charge: TE [id number]  Visit#: 5 of 16  Re-eval: 10/04/14 Assessment Diagnosis: Back and neck pain.  Next MD Visit: Alden Hipp 09/25/14 Prior Therapy: no  Authorization: BCBS  Authorization Time Period:    Authorization Visit#:   of     Subjective: Symptoms/Limitations Symptoms: Pt stated neck is feeling good today,  Back pain Lt Lower side pain scale 7/10.  Reports compliance with HEP 2x daily Pain Assessment Currently in Pain?: Yes Pain Score: 7  Pain Location: Back Pain Orientation: Left;Lower  Objective:  Exercise/Treatments Stretches Prone on Elbows Stretch: 1 rep Press Ups: 5 reps Quad Stretch: 3 reps;Limitations;30 seconds Quad Stretch Limitations: Prone with rope Piriformis Stretch: Limitations;60 seconds Piriformis Stretch Limitations: seated  Standing Heel Raises: 10 reps;Limitations Heel Raises Limitations: Toe raises Functional Squats: 5 reps Lifting: From floor;10 reps Forward Lunge: 10 reps;Limitations Forward Lunge Limitations: 6in step Side Lunge: 10 reps;Limitations Side Lunge Limitations: 6in step Other Standing Lumbar Exercises: 3D hip excursion      Physical Therapy Assessment and Plan PT Assessment and Plan Clinical Impression Statement: Progressed to standing exercises for gluteal and posture awareness, progressed to instruction of proper lifting.  Pt able to demonstrate and verbalize proper lifting with therapist facilitaiton for proper loading to reduce strain on LE.  Continued with stretches to improve flexibilty and hip mobility.  No reports of increase or decreased pain through session.   PT Plan: Progress standing functional activities per PT POC.      Goals PT Short Term Goals PT Short Term Goal 1: Patient will increase hip internal rotation to 30 degrees to  increase shock absorbtion during gait. PT Short Term Goal 2: Patient will increase hip extesnion to 15 degrees to increase stride elngth durign gait PT Short Term Goal 2 - Progress: Progressing toward goal PT Short Term Goal 3: Patient will increase cervical spine side bending to 45 degrees bilaterally to hold a phone against her shoudler PT Short Term Goal 4: Patient will increase cervical spine rotation to >70 degrees bilaterally to increase ability to look over shoulder while driving.  PT Short Term Goal 4 - Progress: Progressing toward goal PT Short Term Goal 5: Patient will be able to extend > 30 degrees withtou pain PT Short Term Goal 5 - Progress: Progressing toward goal PT Long Term Goals PT Long Term Goal 1: patient will be able to bend down to touch toes without pain PT Long Term Goal 2: Patient will be able to demsontrate a 4/5 MMT for trunk flexion indicating improved abdominal strength PT Long Term Goal 2 - Progress: Progressing toward goal Long Term Goal 3: Patient will be able to lift 10lb from floor without pain and with good technque 10 consecutive repetitions to be able to return to work without pain.  Long Term Goal 3 Progress: Progressing toward goal Long Term Goal 4: Patient will be able to walk, stand, sit > 1 hour without pain.   Problem List Patient Active Problem List   Diagnosis Date Noted  . Stiffness of joint, not elsewhere classified, pelvic region and thigh 09/04/2014  . Muscle weakness (generalized) 09/04/2014  . Pain in thoracic spine 09/04/2014  . Lumbago 09/04/2014  . Cervicalgia 09/04/2014  . Need for prophylactic vaccination and inoculation against influenza 09/03/2014  . Need for Tdap vaccination 09/03/2014  . Annual physical exam 05/25/2014  .  Cervical neck pain with evidence of disc disease 05/24/2014  . Thoracic spine pain 05/23/2014  . Lumbar pain with radiation down both legs 05/23/2014  . Depression 03/26/2014  . SHINGLES 02/02/2010  .  VITAMIN D DEFICIENCY 02/02/2010  . ELBOW PAIN, RIGHT 02/02/2010  . FATIGUE 02/02/2010  . OVERWEIGHT 04/20/2009  . DYSLIPIDEMIA 01/30/2009  . ALLERGIC RHINITIS CAUSE UNSPECIFIED 01/30/2009  . NEOPLASM, MALIGNANT, BREAST, RIGHT 04/12/2008  . OSTEOARTHRITIS, KNEES, BILATERAL 04/12/2008  . HYPERTENSION 01/05/2008    PT - End of Session Activity Tolerance: Patient tolerated treatment well General Behavior During Therapy: [facility] for tasks assessed/performed  GP    Aldona Lento 09/16/2014, 3:19 PM

## 2014-09-18 ENCOUNTER — Ambulatory Visit (HOSPITAL_COMMUNITY)
Admission: RE | Admit: 2014-09-18 | Discharge: 2014-09-18 | Disposition: A | Discharge status: Home / Self Care | Payer: BC Managed Care – PPO | Source: Ambulatory Visit | Attending: Anesthesiology | Admitting: Anesthesiology

## 2014-09-18 DIAGNOSIS — IMO0001 Reserved for inherently not codable concepts without codable children: Secondary | ICD-10-CM | POA: Diagnosis not present

## 2014-09-18 NOTE — Progress Notes (Signed)
Physical Therapy Treatment Patient Details  Name: Paula Randolph MRN: [id number] Date of Birth: [birth year]  Today's Date: 09/18/2014 Time: 4917-9150 PT Time Calculation (min): 43 min  Visit#: 6 of 16  Re-eval: 10/04/14 Authorization: BCBS  Charges:  therex 42  Subjective: Symptoms/Limitations Symptoms: Pt states shes just getting off 10 hour shift and with pain/sorness of 7/10 Lt lumbar region. Pain Assessment Currently in Pain?: Yes Pain Score: 7  Pain Location: Back   Exercise/Treatments Stretches Prone on Elbows Stretch: 60 seconds;1 rep Press Ups: 5 reps Quad Stretch: 3 reps;Limitations;30 seconds Quad Stretch Limitations: Prone with rope Piriformis Stretch: 3 reps;30 seconds Piriformis Stretch Limitations: seated  Aerobic Stationary Bike: nustep UE/LE level2 hills #2 37minutes Standing Heel Raises: Limitations;15 reps Heel Raises Limitations: Toe raises 15 reps Functional Squats: 10 reps Forward Lunge: 10 reps;Limitations Forward Lunge Limitations: 6in step Side Lunge: 10 reps;Limitations Side Lunge Limitations: 6in step    Physical Therapy Assessment and Plan PT Assessment and Plan Clinical Impression Statement:  Continued with stretches to improve flexibilty and hip mobility.  Pt able to complete lunges with therapist facilitation for form.  Added nustep with patient reporting decreased LE pain following activity.   No reports of increase or decreased pain through session PT Plan: continue to progress toward goals.  Review lifting/body mechanics next visit.       Problem List Patient Active Problem List   Diagnosis Date Noted  . Stiffness of joint, not elsewhere classified, pelvic region and thigh 09/04/2014  . Muscle weakness (generalized) 09/04/2014  . Pain in thoracic spine 09/04/2014  . Lumbago 09/04/2014  . Cervicalgia 09/04/2014  . Need for prophylactic vaccination and inoculation against influenza 09/03/2014  . Need for Tdap vaccination 09/03/2014   . Annual physical exam 05/25/2014  . Cervical neck pain with evidence of disc disease 05/24/2014  . Thoracic spine pain 05/23/2014  . Lumbar pain with radiation down both legs 05/23/2014  . Depression 03/26/2014  . SHINGLES 02/02/2010  . VITAMIN D DEFICIENCY 02/02/2010  . ELBOW PAIN, RIGHT 02/02/2010  . FATIGUE 02/02/2010  . OVERWEIGHT 04/20/2009  . DYSLIPIDEMIA 01/30/2009  . ALLERGIC RHINITIS CAUSE UNSPECIFIED 01/30/2009  . NEOPLASM, MALIGNANT, BREAST, RIGHT 04/12/2008  . OSTEOARTHRITIS, KNEES, BILATERAL 04/12/2008  . HYPERTENSION 01/05/2008    PT - End of Session Activity Tolerance: Patient tolerated treatment well General Behavior During Therapy: [facility] for tasks assessed/performed   Teena Irani, PTA/CLT 09/18/2014, 9:40 AM

## 2014-09-24 ENCOUNTER — Ambulatory Visit (HOSPITAL_COMMUNITY)
Admission: RE | Admit: 2014-09-24 | Discharge: 2014-09-24 | Disposition: A | Discharge status: Home / Self Care | Payer: BC Managed Care – PPO | Source: Ambulatory Visit | Attending: Family Medicine | Admitting: Family Medicine

## 2014-09-24 DIAGNOSIS — IMO0001 Reserved for inherently not codable concepts without codable children: Secondary | ICD-10-CM | POA: Diagnosis not present

## 2014-09-24 NOTE — Progress Notes (Signed)
Physical Therapy Treatment Patient Details  Name: Paula Randolph MRN: [id number] Date of Birth: 16-Mar-1961  Today's Date: 09/24/2014 Time: 5366-4403 PT Time Calculation (min): 49 min   Charges: TE [phone] Visit#: 7 of 16  Re-eval: 10/04/14 Assessment Diagnosis: Back and neck pain.  Next MD Visit: Alden Hipp 09/25/14 Prior Therapy: no  Authorization: BCBS   Subjective: Symptoms/Limitations Symptoms: Patient just got off work from the night shift , states fatigue and pain in Lt low back.  Pain Assessment Currently in Pain?: Yes Pain Score: 7  Pain Location: Back Pain Orientation: Left;Lower  Exercise/Treatments Stretches Active Hamstring Stretch: 2 reps;20 seconds Active Hamstring Stretch Limitations: 8" box Prone on Elbows Stretch: 60 seconds;2 reps Press Ups: 5 reps Quad Stretch: 2 reps;20 seconds Quad Stretch Limitations: Prone with rope Piriformis Stretch: 2 reps;20 seconds Piriformis Stretch Limitations: seated  Standing Functional Squats: Limitations Functional Squats Limitations: Squat reac matrix with 3lb dumbbell 5x Forward Lunge: 10 reps;Limitations Forward Lunge Limitations: 6in step Side Lunge: 10 reps;Limitations Side Lunge Limitations: 6in step Other Standing Lumbar Exercises: Standing, Lt foot forward Rt UE transverse plane fixed with Lt UE overhead reach 10x each Other Standing Lumbar Exercises: 3D hip excursion 10x Quadruped Madcat/Old Horse: 10 reps Single Arm Raise: 10 reps Straight Leg Raise: 10 reps Plank: 3D UE weight shift at middle height table 10x each  Physical Therapy Assessment and Plan PT Assessment and Plan Clinical Impression Statement: Conuitued progression of exercises to include quaraped and initiate planking to increase trunk stability. Patient demosntrated good perofrmance of all exercises with no increase in pain. Noted decreased pain following type 1 spine stretch with Lt UE overhead reach. Patient demonstrated good  performance of  squat reach matrix for glut and trunk strengthening.  PT Plan: Introduce UE and LE ground matrix at elevated to increase  trunk stability.     Goals PT Short Term Goals PT Short Term Goal 1: Patient will increase hip internal rotation to 30 degrees to increase shock absorbtion during gait. PT Short Term Goal 1 - Progress: Progressing toward goal PT Short Term Goal 2: Patient will increase hip extesnion to 15 degrees to increase stride elngth durign gait PT Short Term Goal 2 - Progress: Progressing toward goal PT Short Term Goal 3: Patient will increase cervical spine side bending to 45 degrees bilaterally to hold a phone against her shoudler PT Short Term Goal 3 - Progress: Progressing toward goal PT Short Term Goal 4: Patient will increase cervical spine rotation to >70 degrees bilaterally to increase ability to look over shoulder while driving.  PT Short Term Goal 4 - Progress: Progressing toward goal PT Short Term Goal 5: Patient will be able to extend > 30 degrees withtou pain PT Short Term Goal 5 - Progress: Progressing toward goal  Problem List Patient Active Problem List   Diagnosis Date Noted  . Stiffness of joint, not elsewhere classified, pelvic region and thigh 09/04/2014  . Muscle weakness (generalized) 09/04/2014  . Pain in thoracic spine 09/04/2014  . Lumbago 09/04/2014  . Cervicalgia 09/04/2014  . Need for prophylactic vaccination and inoculation against influenza 09/03/2014  . Need for Tdap vaccination 09/03/2014  . Annual physical exam 05/25/2014  . Cervical neck pain with evidence of disc disease 05/24/2014  . Thoracic spine pain 05/23/2014  . Lumbar pain with radiation down both legs 05/23/2014  . Depression 03/26/2014  . SHINGLES 02/02/2010  . VITAMIN D DEFICIENCY 02/02/2010  . ELBOW PAIN, RIGHT 02/02/2010  . FATIGUE 02/02/2010  . OVERWEIGHT 04/20/2009  .  DYSLIPIDEMIA 01/30/2009  . ALLERGIC RHINITIS CAUSE UNSPECIFIED 01/30/2009  . NEOPLASM, MALIGNANT, BREAST,  RIGHT 04/12/2008  . OSTEOARTHRITIS, KNEES, BILATERAL 04/12/2008  . HYPERTENSION 01/05/2008   GP    Treesa Mccully R 09/24/2014, 9:27 AM

## 2014-09-26 ENCOUNTER — Ambulatory Visit (HOSPITAL_COMMUNITY)
Admission: RE | Admit: 2014-09-26 | Discharge: 2014-09-26 | Disposition: A | Discharge status: Home / Self Care | Payer: BC Managed Care – PPO | Source: Ambulatory Visit | Attending: Family Medicine | Admitting: Family Medicine

## 2014-09-26 DIAGNOSIS — M546 Pain in thoracic spine: Secondary | ICD-10-CM | POA: Insufficient documentation

## 2014-09-26 DIAGNOSIS — Z5189 Encounter for other specified aftercare: Secondary | ICD-10-CM | POA: Diagnosis not present

## 2014-09-26 DIAGNOSIS — M25561 Pain in right knee: Secondary | ICD-10-CM | POA: Insufficient documentation

## 2014-09-26 DIAGNOSIS — M545 Low back pain: Secondary | ICD-10-CM | POA: Diagnosis not present

## 2014-09-26 DIAGNOSIS — M6281 Muscle weakness (generalized): Secondary | ICD-10-CM | POA: Insufficient documentation

## 2014-09-26 DIAGNOSIS — M542 Cervicalgia: Secondary | ICD-10-CM | POA: Diagnosis not present

## 2014-09-26 DIAGNOSIS — M2560 Stiffness of unspecified joint, not elsewhere classified: Secondary | ICD-10-CM | POA: Diagnosis not present

## 2014-09-26 NOTE — Progress Notes (Signed)
Physical Therapy Treatment Patient Details  Name: Paula Randolph MRN: [id number] Date of Birth: January 20, 1961  Today's Date: 09/26/2014 Time: 5701-7793 PT Time Calculation (min): 48 min Charge;  There ex [phone]  Visit#: 8 of 16  Re-eval: 10/04/14    Authorization: BCBS  Symptoms/Limitations Symptoms: Pt states that her neck has no pain in it but she is still having back pain.  Lt greater than rightt Pain Assessment Currently in Pain?: Yes Pain Score: 5  Pain Location: Back Pain Orientation: Right;Left;Lower    Exercise/Treatments       Stretches Active Hamstring Stretch: 3 reps;30 seconds Active Hamstring Stretch Limitations: 14" box Single Knee to Chest Stretch: 3 reps;30 seconds Pelvic Tilt: 5 reps (x2) Prone Mid Back Stretch: 1 rep;60 seconds Piriformis Stretch: 2 reps;20 seconds Piriformis Stretch Limitations: seated  Standing Heel Raises: 10 reps Heel Raises Limitations: combine with squt to pick up ball off 4" step  Functional Squats Limitations: Squat reac matrix with 3lb dumbbell 5x Forward Lunge: 10 reps;Limitations Forward Lunge Limitations: 6in step Side Lunge: 10 reps;Limitations Side Lunge Limitations: 6in step Other Standing Lumbar Exercises: 3D hip excursion 10x   Quadruped Madcat/Old Horse: 10 reps Single Arm Raise: 10 reps Straight Leg Raise: 10 reps Opposite Arm/Leg Raise: 5 reps Plank: upper extremity ground matrix x 5     Physical Therapy Assessment and Plan PT Assessment and Plan Clinical Impression Statement: Pt able to complete all exercises needing therapist facilitation to perform correctly but after cuing pt is able to self correct.  Added new exercises without difficulty  PT Plan: add LE ground matrix as time ran out so therapist was unable to introduce this exercise this treatment session.    Goals  progressing  Problem List Patient Active Problem List   Diagnosis Date Noted  . Stiffness of joint, not elsewhere classified,  pelvic region and thigh 09/04/2014  . Muscle weakness (generalized) 09/04/2014  . Pain in thoracic spine 09/04/2014  . Lumbago 09/04/2014  . Cervicalgia 09/04/2014  . Need for prophylactic vaccination and inoculation against influenza 09/03/2014  . Need for Tdap vaccination 09/03/2014  . Annual physical exam 05/25/2014  . Cervical neck pain with evidence of disc disease 05/24/2014  . Thoracic spine pain 05/23/2014  . Lumbar pain with radiation down both legs 05/23/2014  . Depression 03/26/2014  . SHINGLES 02/02/2010  . VITAMIN D DEFICIENCY 02/02/2010  . ELBOW PAIN, RIGHT 02/02/2010  . FATIGUE 02/02/2010  . OVERWEIGHT 04/20/2009  . DYSLIPIDEMIA 01/30/2009  . ALLERGIC RHINITIS CAUSE UNSPECIFIED 01/30/2009  . NEOPLASM, MALIGNANT, BREAST, RIGHT 04/12/2008  . OSTEOARTHRITIS, KNEES, BILATERAL 04/12/2008  . HYPERTENSION 01/05/2008       GP    Johathan Province,CINDY 09/26/2014, 9:37 AM

## 2014-10-01 ENCOUNTER — Ambulatory Visit (HOSPITAL_COMMUNITY)
Admission: RE | Admit: 2014-10-01 | Discharge: 2014-10-01 | Disposition: A | Discharge status: Home / Self Care | Payer: BC Managed Care – PPO | Source: Ambulatory Visit | Attending: Family Medicine | Admitting: Family Medicine

## 2014-10-01 DIAGNOSIS — Z5189 Encounter for other specified aftercare: Secondary | ICD-10-CM | POA: Diagnosis not present

## 2014-10-01 NOTE — Evaluation (Signed)
Physical Therapy Reassessment  Patient Details  Name: Paula Randolph MRN: [id number] Date of Birth: [birth year]  Today's Date: 10/01/2014 Time: 5102-5852 PT Time Calculation (min): 46 min     Charges: TE [id number], 1 MMT ,1 ROMM         Visit#: 9 of 16  Re-eval: 10/31/14 Assessment Diagnosis: Back and neck pain.  Next MD Visit: Alden Hipp 09/25/14 Prior Therapy: no  Authorization: BC[employer]     Past Medical History:  Past Medical History  Diagnosis Date  . Hypertension   . Vitamin D deficiency   . Dyslipidemia   . Overweight(278.02)   . Allergy   . Primary osteoarthritis of both knees   . Right arm pain   . Trochanteric bursitis   . Strain of lumbar spine   . Cancer     right breast    Past Surgical History:  Past Surgical History  Procedure Laterality Date  . Right breast     . Mastectomy    . Appendectomy    . Abdominal hysterectomy      fibroids  . Breast surgery Right 2008    partial  . Colonoscopy N/A 05/10/2014    Procedure: COLONOSCOPY;  Surgeon: Daneil Dolin, MD;  Location: [facility];  Service: Endoscopy;  Laterality: N/A;  8:30 AM    Subjective Symptoms/Limitations Symptoms: Pt states that her neck has no pain in it but she is still having "a little" back pain.  pain on Lt > than Rt Pain Assessment Currently in Pain?: Yes Pain Score: 5  Pain Location: Back  Sensation/Coordination/Flexibility/Functional Tests Flexibility Thomas: Positive (minimally limited) 90/90: Negative Functional Tests Functional Tests: FOTO: 18% limited was 46% limited Functional Tests: positive pirifomris test bilaterally. minimally limited Functional Tests: positive straight leg raise test for trunk instability (delayed).   Assessment RLE Strength Right Hip Flexion: 4/5 Right Hip Extension: 2+/5 Right Hip External Rotation : 45 Right Hip Internal Rotation : 35 Right Knee Flexion: 5/5 Right Knee Extension: 5/5 Right Ankle Dorsiflexion: 5/5 LLE AROM (degrees) LLE Overall  AROM Comments: poitiive ely's test LLE Strength Left Hip Flexion: 4/5 Left Hip Extension: 2+/5 Left Hip External Rotation : 35 Left Hip Internal Rotation : 35 Left Knee Flexion: 4/5 Left Knee Extension: 5/5 Left Ankle Dorsiflexion:  (4+/5) Cervical AROM Cervical Flexion: 70 Cervical Extension: 70 Cervical - Right Side Bend: 45 Cervical - Left Side Bend: 45 Cervical - Right Rotation: 77 Cervical - Left Rotation: 75 Lumbar AROM Lumbar Flexion: 105 Lumbar Extension: 45 Lumbar - Right Side Bend: WNL no pain Lumbar - Left Side Bend: WNL no pain Lumbar - Right Rotation: WNL no pain Lumbar - Left Rotation: WNL no pain Lumbar Strength Lumbar Flexion: 4/5 Lumbar Extension: 4/5  Exercise/Treatments Stretches Active Hamstring Stretch: 2 reps;20 seconds Active Hamstring Stretch Limitations: 8" box Prone on Elbows Stretch: 60 seconds;1 rep Press Ups: 5 reps;10 seconds Quad Stretch: 2 reps;20 seconds Quad Stretch Limitations: Prone with rope Piriformis Stretch: 2 reps;20 seconds Piriformis Stretch Limitations: seated  Standing Functional Squats Limitations: Squat reac matrix with 4lb dumbbell 5x Forward Lunge: 10 reps;Limitations Forward Lunge Limitations: 4in step with overhead reach and knee high reach 5x each Side Lunge: 10 reps;Limitations Side Lunge Limitations: 6in step Quadruped Plank: upper extremity ground matrix x 5, LE ground matrix 5x  Physical Therapy Assessment and Plan PT Assessment and Plan Clinical Impression Statement: Reassessment completed with patient making great strides in strength and ROM throughout. Patient now only has pain with repetitive lifting and lifting  of heavy objects indicating focus of therapy to shift to primarily work hardening with a secondary focus on progressing and maintaiing hip and knee AROM. No pain noted throughtou testign and session except with abdominal sit up. Remaining focus of therapy to be on increasign hip flexor and quadracep  mobility, and increasing abdominal and glut max strength. PT Plan: Conitnue to progress LE and spinal stabilization exercises as appropriate for LE. Progress lunges to floor, progress weights with squat reach matrix to 5lb and add focus for depth. conitnue PT 2x a week for 4 more weeks to reach all long term goals.     Goals PT Short Term Goals PT Short Term Goal 1: Patient will increase hip internal rotation to 30 degrees to increase shock absorbtion during gait. PT Short Term Goal 1 - Progress: Met PT Short Term Goal 2: Patient will increase hip extesnion to 15 degrees to increase stride elngth durign gait PT Short Term Goal 2 - Progress: Progressing toward goal PT Short Term Goal 3: Patient will increase cervical spine side bending to 45 degrees bilaterally to hold a phone against her shoudler PT Short Term Goal 3 - Progress: Met PT Short Term Goal 4: Patient will increase cervical spine rotation to >70 degrees bilaterally to increase ability to look over shoulder while driving.  PT Short Term Goal 4 - Progress: Met PT Short Term Goal 5: Patient will be able to extend > 30 degrees withtou pain PT Short Term Goal 5 - Progress: Met PT Long Term Goals PT Long Term Goal 1: patient will be able to bend down to touch toes without pain PT Long Term Goal 1 - Progress: Met PT Long Term Goal 2: Patient will be able to demsontrate a 4/5 MMT for trunk flexion indicating improved abdominal strength PT Long Term Goal 2 - Progress: Met Long Term Goal 3: Patient will be able to lift 10lb from floor without pain and with good technque 10 consecutive repetitions to be able to return to work without pain.  Long Term Goal 3 Progress: Progressing toward goal Long Term Goal 4: Patient will be able to walk, stand, sit > 1 hour without pain.  Long Term Goal 4 Progress: Met PT Long Term Goal 5: Patient will be able to demsontrate a 5/5 MMT for trunk flexion indicating improved abdominal strength for increase  stability with lifting heavy objects Long Term Goal 5 Progress: Progressing toward goal Additional PT Long Term Goals?: Yes PT Long Term Goal 6: Patient will dmeowsntrate increased glut max strength of 4/5 MMT to improve hip strength to decrease strain on back with lifting Long Term Goal 6 Progress: Progressing toward goal  Problem List Patient Active Problem List   Diagnosis Date Noted  . Stiffness of joint, not elsewhere classified, pelvic region and thigh 09/04/2014  . Muscle weakness (generalized) 09/04/2014  . Pain in thoracic spine 09/04/2014  . Lumbago 09/04/2014  . Cervicalgia 09/04/2014  . Need for prophylactic vaccination and inoculation against influenza 09/03/2014  . Need for Tdap vaccination 09/03/2014  . Annual physical exam 05/25/2014  . Cervical neck pain with evidence of disc disease 05/24/2014  . Thoracic spine pain 05/23/2014  . Lumbar pain with radiation down both legs 05/23/2014  . Depression 03/26/2014  . SHINGLES 02/02/2010  . VITAMIN D DEFICIENCY 02/02/2010  . ELBOW PAIN, RIGHT 02/02/2010  . FATIGUE 02/02/2010  . OVERWEIGHT 04/20/2009  . DYSLIPIDEMIA 01/30/2009  . ALLERGIC RHINITIS CAUSE UNSPECIFIED 01/30/2009  . NEOPLASM, MALIGNANT, BREAST, RIGHT 04/12/2008  .  OSTEOARTHRITIS, KNEES, BILATERAL 04/12/2008  . HYPERTENSION 01/05/2008    PT - End of Session Activity Tolerance: Patient tolerated treatment well General Behavior During Therapy: [facility] for tasks assessed/performed  GP    Eriq Hufford R 10/01/2014, 9:35 AM  Physician Documentation Your signature is required to indicate approval of the treatment plan as stated above.  Please sign and either send electronically or make a copy of this report for your files and return this physician signed original.   Please mark one 1.__approve of plan  2. ___approve of plan with the following conditions.   ______________________________                                                           _____________________ Physician Signature                                                                                                             Date

## 2014-10-03 ENCOUNTER — Ambulatory Visit (HOSPITAL_COMMUNITY)
Admission: RE | Admit: 2014-10-03 | Discharge: 2014-10-03 | Disposition: A | Discharge status: Home / Self Care | Payer: BC Managed Care – PPO | Source: Ambulatory Visit

## 2014-10-03 DIAGNOSIS — Z5189 Encounter for other specified aftercare: Secondary | ICD-10-CM | POA: Diagnosis not present

## 2014-10-03 NOTE — Progress Notes (Signed)
Physical Therapy Treatment Patient Details  Name: Paula Randolph MRN: [id number] Date of Birth: 01/09/1960  Today's Date: 10/03/2014 Time: 0850-0930 PT Time Calculation (min): 40 min Visit#: 10 of 16  Re-eval: 10/31/14 Authorization: [place]  Charges:  therex [phone] (30'), manual [phone] (10')  Subjective:  Pt reports 6/10 pain today upon entrance into clinic.   Exercise/Treatments Stretches Active Hamstring Stretch: 2 reps;20 seconds Active Hamstring Stretch Limitations: 14" box Prone on Elbows Stretch: Limitations Prone on Elbows Stretch Limitations: 2 minutes Press Ups: Limitations Press Ups Limitations: 10 reps 5" holds Quad Stretch: 4 reps;20 seconds Quad Stretch Limitations: Prone with rope Piriformis Stretch: 2 reps;20 seconds Piriformis Stretch Limitations: seated  Aerobic Tread Mill: 1.2 mph at 3% incline following MET Standing Functional Squats Limitations: Squat reac matrix with 4lb dumbbell 5x Forward Lunge: 10 reps;Limitations Forward Lunge Limitations: 4in step with overhead reach and knee high reach 5x each Side Lunge: 10 reps;Limitations Side Lunge Limitations: 4in step    Manual Therapy Manual Therapy: Other (comment) Other Manual Therapy: MET for Lt hip  anterior rotation followed by treadmill X 5 minutes  Physical Therapy Assessment and Plan PT Assessment and Plan Clinical Impression Statement: Noted pain with bed mobility today into Lt hip/posterior lumbar.  Assessed SI alignment and patient with Lt anterior rotation.  Completed MET after which patient reported being painfree.  Continued therex/stretches per POC.  PT Plan: Conitnue to progress LE and spinal stabilization exercises as appropriate for LE. Check SI prior to therex next visit.      Problem List Patient Active Problem List   Diagnosis Date Noted  . Stiffness of joint, not elsewhere classified, pelvic region and thigh 09/04/2014  . Muscle weakness (generalized) 09/04/2014  . Pain in  thoracic spine 09/04/2014  . Lumbago 09/04/2014  . Cervicalgia 09/04/2014  . Need for prophylactic vaccination and inoculation against influenza 09/03/2014  . Need for Tdap vaccination 09/03/2014  . Annual physical exam 05/25/2014  . Cervical neck pain with evidence of disc disease 05/24/2014  . Thoracic spine pain 05/23/2014  . Lumbar pain with radiation down both legs 05/23/2014  . Depression 03/26/2014  . SHINGLES 02/02/2010  . VITAMIN D DEFICIENCY 02/02/2010  . ELBOW PAIN, RIGHT 02/02/2010  . FATIGUE 02/02/2010  . OVERWEIGHT 04/20/2009  . DYSLIPIDEMIA 01/30/2009  . ALLERGIC RHINITIS CAUSE UNSPECIFIED 01/30/2009  . NEOPLASM, MALIGNANT, BREAST, RIGHT 04/12/2008  . OSTEOARTHRITIS, KNEES, BILATERAL 04/12/2008  . HYPERTENSION 01/05/2008    PT - End of Session Activity Tolerance: Patient tolerated treatment well General Behavior During Therapy: [facility] for tasks assessed/performed  GP    Teena Irani, PTA/CLT 10/03/2014, 10:13 AM

## 2014-10-08 ENCOUNTER — Ambulatory Visit (HOSPITAL_COMMUNITY)
Admission: RE | Admit: 2014-10-08 | Discharge: 2014-10-08 | Disposition: A | Discharge status: Home / Self Care | Payer: BC Managed Care – PPO | Source: Ambulatory Visit | Attending: Family Medicine | Admitting: Family Medicine

## 2014-10-08 DIAGNOSIS — Z5189 Encounter for other specified aftercare: Secondary | ICD-10-CM | POA: Diagnosis not present

## 2014-10-08 NOTE — Progress Notes (Signed)
Physical Therapy Treatment Patient Details  Name: Paula Randolph MRN: [id number] Date of Birth: 01/09/1960  Today's Date: 10/08/2014 Time: 0850-0930 PT Time Calculation (min): 40 min   Charges: TE [id number] Visit#: 11 of 16  Re-eval: 10/31/14   Authorization: BCBS   Subjective: Symptoms/Limitations Symptoms: Pt states that her neck has no pain in it but she is still having "a little" back pain.  pain on Lt > than Rt, notes minor Rt knee pain upon entering, no complaint during therapy  Pain Assessment Currently in Pain?: Yes Pain Score: 3  Pain Location: Back Pain Orientation: Right;Left;Lower  Precautions/Restrictions     Exercise/Treatments Stretches Active Hamstring Stretch: 2 reps;20 seconds Active Hamstring Stretch Limitations: 14" box Hip Flexor Stretch: 2 reps;30 seconds Quad Stretch: 4 reps;20 seconds Quad Stretch Limitations: Prone with rope Piriformis Stretch: 2 reps;20 seconds Piriformis Stretch Limitations: seated  Standing Functional Squats Limitations: Squat reac matrix with 4lb dumbbell 5x, 3way golfer squat with yellow ball 10x Forward Lunge: 10 reps;Limitations Forward Lunge Limitations: 4in step with overhead reach and knee high reach 5x each Side Lunge: 10 reps;Limitations Side Lunge Limitations: 4in step Quadruped Plank: upper extremity ground matrix x 5, LE ground matrix 5x  Physical Therapy Assessment and Plan PT Assessment and Plan Clinical Impression Statement: Patient displays improving strength and mobility though patient conitnues to lack full hip extension AROM due to strength deficits from neutral through 30 degrees hip extension; PROM WNL. Session focused on core and LE strengthening to increase full AROM.  PT Plan: Conitnue to progress LE and spinal stabilization exercises as appropriate for LE. increase resistance with exercises. Introduce Single leg bridges.   Goals PT Short Term Goals PT Short Term Goal 2: Patient will increase hip  extesnion to 15 degrees to increase stride length durign gait PT Short Term Goal 2 - Progress: Progressing toward goal PT Long Term Goals PT Long Term Goal 5: Patient will be able to demsontrate a 5/5 MMT for trunk flexion indicating improved abdominal strength for increase stability with lifting heavy objects Long Term Goal 5 Progress: Progressing toward goal PT Long Term Goal 6: Patient will dmeowsntrate increased glut max strength of 4/5 MMT to improve hip strength to decrease strain on back with lifting Long Term Goal 6 Progress: Progressing toward goal  Problem List Patient Active Problem List   Diagnosis Date Noted  . Stiffness of joint, not elsewhere classified, pelvic region and thigh 09/04/2014  . Muscle weakness (generalized) 09/04/2014  . Pain in thoracic spine 09/04/2014  . Lumbago 09/04/2014  . Cervicalgia 09/04/2014  . Need for prophylactic vaccination and inoculation against influenza 09/03/2014  . Need for Tdap vaccination 09/03/2014  . Annual physical exam 05/25/2014  . Cervical neck pain with evidence of disc disease 05/24/2014  . Thoracic spine pain 05/23/2014  . Lumbar pain with radiation down both legs 05/23/2014  . Depression 03/26/2014  . SHINGLES 02/02/2010  . VITAMIN D DEFICIENCY 02/02/2010  . ELBOW PAIN, RIGHT 02/02/2010  . FATIGUE 02/02/2010  . OVERWEIGHT 04/20/2009  . DYSLIPIDEMIA 01/30/2009  . ALLERGIC RHINITIS CAUSE UNSPECIFIED 01/30/2009  . NEOPLASM, MALIGNANT, BREAST, RIGHT 04/12/2008  . OSTEOARTHRITIS, KNEES, BILATERAL 04/12/2008  . HYPERTENSION 01/05/2008    PT - End of Session Activity Tolerance: Patient tolerated treatment well General Behavior During Therapy: [facility] for tasks assessed/performed  GP    Finneas Mathe R 10/08/2014, 9:32 AM

## 2014-10-10 ENCOUNTER — Ambulatory Visit (HOSPITAL_COMMUNITY)
Admission: RE | Admit: 2014-10-10 | Discharge: 2014-10-10 | Disposition: A | Discharge status: Home / Self Care | Payer: BC Managed Care – PPO | Source: Ambulatory Visit | Attending: Anesthesiology | Admitting: Anesthesiology

## 2014-10-10 DIAGNOSIS — Z5189 Encounter for other specified aftercare: Secondary | ICD-10-CM | POA: Diagnosis not present

## 2014-10-10 NOTE — Progress Notes (Signed)
Physical Therapy Treatment Patient Details  Name: Paula Randolph MRN: [id number] Date of Birth: 01/09/1960  Today's Date: 10/10/2014 Time: 7106-2694 PT Time Calculation (min): 42 min Charge: TE [id number]  Visit#: 12 of 16  Re-eval: 10/31/14 Assessment Diagnosis: Back and neck pain.  Next MD Visit: Daria Pastures Prior Therapy: no  Authorization: BCBS  Authorization Time Period:    Authorization Visit#:   of     Subjective: Symptoms/Limitations Symptoms: Pt stated she was tired, came directly from work.  Current Lt side LBP scale 3/10 today.  Neck is feeling good.   Pain Assessment Currently in Pain?: Yes Pain Score: 3  Pain Location: Back Pain Orientation: Lower;Left  Objective:  Exercise/Treatments Stretches Active Hamstring Stretch: 3 reps;30 seconds;Limitations Active Hamstring Stretch Limitations: 14" box 3 directions Hip Flexor Stretch: 3 reps;30 seconds Quad Stretch: 3 reps;30 seconds;Limitations Quad Stretch Limitations: Prone with rope Piriformis Stretch: 3 reps;30 seconds;Limitations Piriformis Stretch Limitations: seated  Standing Functional Squats Limitations: Squat reac matrix with 4lb dumbbell 5x, 3way golfer squat with yellow ball 10x Forward Lunge: 10 reps;Limitations Forward Lunge Limitations: 4in step with overhead reach and knee high reach 10x each Side Lunge: 10 reps;Limitations Side Lunge Limitations: 4in step Supine Bridge: 10 reps;Limitations Bridge Limitations: single leg bridges with SLR opposite LE 2 sets of 5    Physical Therapy Assessment and Plan PT Assessment and Plan Clinical Impression Statement: Began single leg bridges for gluteal strengthening and to improve hip extension with noted musculature fatigue due to weakness, cueing required for full hip extension.  Therapist facilitation for appropraite form with activities. No reports of increased pain through session.   PT Plan: Conitnue to progress LE and spinal stabilization  exercises as appropriate for LE. increase resistance with exercises.    Goals PT Short Term Goals PT Short Term Goal 1: Patient will increase hip internal rotation to 30 degrees to increase shock absorbtion during gait. PT Short Term Goal 2: Patient will increase hip extesnion to 15 degrees to increase stride length durign gait PT Short Term Goal 2 - Progress: Progressing toward goal PT Short Term Goal 3: Patient will increase cervical spine side bending to 45 degrees bilaterally to hold a phone against her shoudler PT Short Term Goal 4: Patient will increase cervical spine rotation to >70 degrees bilaterally to increase ability to look over shoulder while driving.  PT Short Term Goal 5: Patient will be able to extend > 30 degrees withtou pain PT Long Term Goals PT Long Term Goal 1: patient will be able to bend down to touch toes without pain PT Long Term Goal 2: Patient will be able to demsontrate a 4/5 MMT for trunk flexion indicating improved abdominal strength Long Term Goal 3: Patient will be able to lift 10lb from floor without pain and with good technque 10 consecutive repetitions to be able to return to work without pain.  Long Term Goal 4: Patient will be able to walk, stand, sit > 1 hour without pain.  PT Long Term Goal 5: Patient will be able to demsontrate a 5/5 MMT for trunk flexion indicating improved abdominal strength for increase stability with lifting heavy objects Long Term Goal 5 Progress: Progressing toward goal PT Long Term Goal 6: Patient will dmeowsntrate increased glut max strength of 4/5 MMT to improve hip strength to decrease strain on back with lifting Long Term Goal 6 Progress: Progressing toward goal  Problem List Patient Active Problem List   Diagnosis Date Noted  . Stiffness of joint, not  elsewhere classified, pelvic region and thigh 09/04/2014  . Muscle weakness (generalized) 09/04/2014  . Pain in thoracic spine 09/04/2014  . Lumbago 09/04/2014  .  Cervicalgia 09/04/2014  . Need for prophylactic vaccination and inoculation against influenza 09/03/2014  . Need for Tdap vaccination 09/03/2014  . Annual physical exam 05/25/2014  . Cervical neck pain with evidence of disc disease 05/24/2014  . Thoracic spine pain 05/23/2014  . Lumbar pain with radiation down both legs 05/23/2014  . Depression 03/26/2014  . SHINGLES 02/02/2010  . VITAMIN D DEFICIENCY 02/02/2010  . ELBOW PAIN, RIGHT 02/02/2010  . FATIGUE 02/02/2010  . OVERWEIGHT 04/20/2009  . DYSLIPIDEMIA 01/30/2009  . ALLERGIC RHINITIS CAUSE UNSPECIFIED 01/30/2009  . NEOPLASM, MALIGNANT, BREAST, RIGHT 04/12/2008  . OSTEOARTHRITIS, KNEES, BILATERAL 04/12/2008  . HYPERTENSION 01/05/2008    PT - End of Session Activity Tolerance: Patient tolerated treatment well General Behavior During Therapy: [facility] for tasks assessed/performed  GP    Aldona Lento 10/10/2014, 9:46 AM

## 2014-10-14 ENCOUNTER — Encounter: Payer: Self-pay | Admitting: Family Medicine

## 2014-10-15 ENCOUNTER — Ambulatory Visit (HOSPITAL_COMMUNITY)
Admission: RE | Admit: 2014-10-15 | Discharge: 2014-10-15 | Disposition: A | Discharge status: Home / Self Care | Payer: BC Managed Care – PPO | Source: Ambulatory Visit | Attending: Family Medicine | Admitting: Family Medicine

## 2014-10-15 DIAGNOSIS — Z5189 Encounter for other specified aftercare: Secondary | ICD-10-CM | POA: Diagnosis not present

## 2014-10-15 NOTE — Progress Notes (Signed)
Physical Therapy Treatment Patient Details  Name: Paula Randolph MRN: [id number] Date of Birth: 01/09/1960  Today's Date: 10/15/2014 Time: 1941-7408 PT Time Calculation (min): 49 min   Charges: manual [phone], TE [phone] Visit#: 13 of 16  Re-eval: 10/31/14 Assessment Diagnosis: Back and neck pain.  Next MD Visit: Daria Pastures Prior Therapy: no  Authorization: [employer]  Authorization Time Period:    Authorization Visit#:   of     Subjective: Symptoms/Limitations Symptoms: Patient notes increased pain/weakness in posterior knee and thigh statign that it has been aggravated since work. Pain Assessment Currently in Pain?: Yes Pain Score: 9  Pain Location: Back Pain Orientation: Lower;Left  Exercise/Treatments Stretches Active Hamstring Stretch: 3 reps;Limitations;20 seconds Active Hamstring Stretch Limitations: 14" box 3 directions Hip Flexor Stretch: 3 reps;20 seconds Quad Stretch: 3 reps;30 seconds;Limitations Quad Stretch Limitations: Prone with rope Piriformis Stretch: 3 reps;Limitations;20 seconds Piriformis Stretch Limitations: seated  Standing Functional Squats Limitations: Squat reach matrix with 4lb dumbbell 5x, 3way golfer squat with yellow ball 10x Other Standing Lumbar Exercises: 3D hip excursion 10x Supine Bent Knee Raise: Limitations;10 reps Bent Knee Raise Limitations: unilateral alternating and bilateral, with 3 second hold Bridge: 10 reps;Limitations Bridge Limitations: bilateral LE.  Quadruped [place]: 10 reps Single Arm Raise: 10 reps Straight Leg Raise: 10 reps Opposite Arm/Leg Raise: 10 reps;Left arm/Right leg;Right arm/Left leg  Manual Therapy Other Manual Therapy: MET for Lt hip anterior rotation and grade 3-4 joint mobilizations thoughout Lumbar spine (pain noted only at L5) 100mnutes.  Noted tenderness at Lt quadratus lumborum and Lt piriformis.   Physical Therapy Assessment and Plan PT Assessment and Plan Clinical Impression  Statement: Patient noted increased pain this session with noted increased pain radiating into Rt LE. Patient continues to note improvement with extension based activities and with trunk strengthening, Noted tightness in piriformis. Patient noted minimal improvemrnt follwoing MET and bridging exercises to correct hip alignment. Patient had no pain with supine abdominal strengthing nor prone and quadraped exercises. Patient noted pain significantly improved from 9/10 to 6/10 at end of session PT Plan: Reassess pain next session and reinitiate higher level core and LE strenghtieng per improvement in symptoms.     Goals PT Short Term Goals PT Short Term Goal 2: Patient will increase hip extesnion to 15 degrees to increase stride length durign gait PT Short Term Goal 2 - Progress: Progressing toward goal PT Long Term Goals Long Term Goal 3: Patient will be able to lift 10lb from floor without pain and with good technque 10 consecutive repetitions to be able to return to work without pain.  Long Term Goal 3 Progress: Progressing toward goal PT Long Term Goal 5: Patient will be able to demsontrate a 5/5 MMT for trunk flexion indicating improved abdominal strength for increase stability with lifting heavy objects Long Term Goal 5 Progress: Progressing toward goal PT Long Term Goal 6: Patient will demonstrate increased glut max strength of 4/5 MMT to improve hip strength to decrease strain on back with lifting Long Term Goal 6 Progress: Progressing toward goal  Problem List Patient Active Problem List   Diagnosis Date Noted  . Stiffness of joint, not elsewhere classified, pelvic region and thigh 09/04/2014  . Muscle weakness (generalized) 09/04/2014  . Pain in thoracic spine 09/04/2014  . Lumbago 09/04/2014  . Cervicalgia 09/04/2014  . Need for prophylactic vaccination and inoculation against influenza 09/03/2014  . Need for Tdap vaccination 09/03/2014  . Annual physical exam 05/25/2014  . Cervical  neck pain with evidence  of disc disease 05/24/2014  . Thoracic spine pain 05/23/2014  . Lumbar pain with radiation down both legs 05/23/2014  . Depression 03/26/2014  . SHINGLES 02/02/2010  . VITAMIN D DEFICIENCY 02/02/2010  . ELBOW PAIN, RIGHT 02/02/2010  . FATIGUE 02/02/2010  . OVERWEIGHT 04/20/2009  . DYSLIPIDEMIA 01/30/2009  . ALLERGIC RHINITIS CAUSE UNSPECIFIED 01/30/2009  . NEOPLASM, MALIGNANT, BREAST, RIGHT 04/12/2008  . OSTEOARTHRITIS, KNEES, BILATERAL 04/12/2008  . HYPERTENSION 01/05/2008    PT - End of Session Activity Tolerance: Patient tolerated treatment well General Behavior During Therapy: [facility] for tasks assessed/performed  GP    DeWitt, Cash R 10/15/2014, 9:26 AM

## 2014-10-17 ENCOUNTER — Ambulatory Visit (HOSPITAL_COMMUNITY)
Admission: RE | Admit: 2014-10-17 | Discharge: 2014-10-17 | Disposition: A | Discharge status: Home / Self Care | Payer: BC Managed Care – PPO | Source: Ambulatory Visit | Attending: Family Medicine | Admitting: Family Medicine

## 2014-10-17 DIAGNOSIS — Z5189 Encounter for other specified aftercare: Secondary | ICD-10-CM | POA: Diagnosis not present

## 2014-10-17 NOTE — Progress Notes (Signed)
Physical Therapy Treatment Patient Details  Name: Paula Randolph MRN: [id number] Date of Birth: 01/09/1960  Today's Date: 10/17/2014 Time: 4196-2229 PT Time Calculation (min): 48 min Charge: TE [id number]  Visit#: 14 of 16  Re-eval: 10/31/14 Assessment Diagnosis: Back and neck pain.  Next MD Visit: Daria Pastures Prior Therapy: no  Authorization: BC[employer]  Authorization Time Period:    Authorization Visit#:   of     Subjective: Symptoms/Limitations Symptoms: Pain scale 3/10 Lt Lower back and Rt gastroc region Pain Assessment Currently in Pain?: Yes Pain Score: 3  Pain Location: Back Pain Orientation: Lower;Left  Objective:   Exercise/Treatments Standing Functional Squats: 10 reps;Limitations Functional Squats Limitations: 3 way golf squat with yellow theraball Lifting: From floor;10 reps;Limitations Lifting Limitations: yellow theraball with heel raise following Supine Ab Set: 10 reps;Limitations AB Set Limitations: PFC with tactile and verbal cueing, goal 10", fatigued at 5-6 seconds Bent Knee Raise: Limitations;10 reps;3 seconds Bridge: 10 reps;5 seconds;Limitations Bridge Limitations: bilateral LE.  Large Ball Abdominal Isometric: 10 reps;5 seconds Large Ball Oblique Isometric: 10 reps;5 seconds Quadruped Single Arm Raise: 10 reps;5 seconds Straight Leg Raise: 10 reps;5 seconds Opposite Arm/Leg Raise: 10 reps;Left arm/Right leg;Right arm/Left leg     Physical Therapy Assessment and Plan PT Assessment and Plan Clinical Impression Statement: SI within alignment, no muscle energy techniuqe.  Pt instructed pelvic floor contraction for transverseo assist wtih correct SI alignment with multimodal cueing for correct muscle activation, PFC weak able to hold contraction 5-6 seconds prior limited by fatigue, goal 10" holds this session.  Progressed  core strengtehning activites with therapeutic ball focusing on transverse abdominus and oblique musculature contractions for  strengtehning as well as gluteal strengthening with proper lifting techniques.  Pt able to verbalize and demonstrate appropraite technique with proper lifting techniques.  Pt stated pain scale reduced to 2/10 atend of session. PT Plan: Continue with current PT POC, reassess pain level next session and progress to higher level core and LE strengthening activties per tolerance.    Goals PT Short Term Goals PT Short Term Goal 1: Patient will increase hip internal rotation to 30 degrees to increase shock absorbtion during gait. PT Short Term Goal 2: Patient will increase hip extesnion to 15 degrees to increase stride length durign gait PT Short Term Goal 3: Patient will increase cervical spine side bending to 45 degrees bilaterally to hold a phone against her shoudler PT Short Term Goal 4: Patient will increase cervical spine rotation to >70 degrees bilaterally to increase ability to look over shoulder while driving.  PT Short Term Goal 5: Patient will be able to extend > 30 degrees withtou pain PT Long Term Goals PT Long Term Goal 1: patient will be able to bend down to touch toes without pain PT Long Term Goal 2: Patient will be able to demsontrate a 4/5 MMT for trunk flexion indicating improved abdominal strength Long Term Goal 3: Patient will be able to lift 10lb from floor without pain and with good technque 10 consecutive repetitions to be able to return to work without pain.  Long Term Goal 4: Patient will be able to walk, stand, sit > 1 hour without pain.  PT Long Term Goal 5: Patient will be able to demsontrate a 5/5 MMT for trunk flexion indicating improved abdominal strength for increase stability with lifting heavy objects Long Term Goal 5 Progress: Progressing toward goal PT Long Term Goal 6: Patient will demonstrate increased glut max strength of 4/5 MMT to improve hip strength to  decrease strain on back with lifting Long Term Goal 6 Progress: Progressing toward goal  Problem  List Patient Active Problem List   Diagnosis Date Noted  . Stiffness of joint, not elsewhere classified, pelvic region and thigh 09/04/2014  . Muscle weakness (generalized) 09/04/2014  . Pain in thoracic spine 09/04/2014  . Lumbago 09/04/2014  . Cervicalgia 09/04/2014  . Need for prophylactic vaccination and inoculation against influenza 09/03/2014  . Need for Tdap vaccination 09/03/2014  . Annual physical exam 05/25/2014  . Cervical neck pain with evidence of disc disease 05/24/2014  . Thoracic spine pain 05/23/2014  . Lumbar pain with radiation down both legs 05/23/2014  . Depression 03/26/2014  . SHINGLES 02/02/2010  . VITAMIN D DEFICIENCY 02/02/2010  . ELBOW PAIN, RIGHT 02/02/2010  . FATIGUE 02/02/2010  . OVERWEIGHT 04/20/2009  . DYSLIPIDEMIA 01/30/2009  . ALLERGIC RHINITIS CAUSE UNSPECIFIED 01/30/2009  . NEOPLASM, MALIGNANT, BREAST, RIGHT 04/12/2008  . OSTEOARTHRITIS, KNEES, BILATERAL 04/12/2008  . HYPERTENSION 01/05/2008    PT - End of Session Activity Tolerance: Patient tolerated treatment well General Behavior During Therapy: [facility] for tasks assessed/performed  GP    Aldona Lento 10/17/2014, 10:06 AM

## 2014-10-22 ENCOUNTER — Ambulatory Visit (HOSPITAL_COMMUNITY)
Admission: RE | Admit: 2014-10-22 | Discharge: 2014-10-22 | Disposition: A | Discharge status: Home / Self Care | Payer: BC Managed Care – PPO | Source: Ambulatory Visit | Attending: Family Medicine | Admitting: Family Medicine

## 2014-10-22 DIAGNOSIS — Z5189 Encounter for other specified aftercare: Secondary | ICD-10-CM | POA: Diagnosis not present

## 2014-10-22 NOTE — Progress Notes (Signed)
Physical Therapy Treatment Patient Details  Name: Paula Randolph MRN: [id number] Date of Birth: 01/09/1960  Today's Date: 10/22/2014 Time: 1062-6948 PT Time Calculation (min): 41 min   Charges: man [phone], TE [phone] Visit#: 13 of 16  Authorization: BCBS   Subjective: Symptoms/Limitations Symptoms: Patient arrives swith increased pain in posterior Rt Leg that radiates into Rt anterior shin. No complaint of low back pain notes no numbness and tingling but stinging pain in Rt LE. Pain Assessment Currently in Pain?: Yes Pain Score: 3   Exercise/Treatments Stretches Active Hamstring Stretch Limitations: 10x 3" 3 way 8" box 3 directions Hip Flexor Stretch Limitations: 10x 3" 3 way 8" box 3 directions Standing Functional Squats: 10 reps;Limitations Functional Squats Limitations: 3 way golf squat with yellow theraball Forward Lunge: 10 reps Forward Lunge Limitations: with 3lb dumbbells to floor Row: Limitations Row Limitations: 4 way pick up and reach with 5lb dumbbell 10x Quadruped Plank: upper extremity ground matrix x 5, LE ground matrix 5x  Manual therapy: manual nerve glides, and manual lateral hamstring stretch (53minutes)  Physical Therapy Assessment and Plan PT Assessment and Plan Clinical Impression Statement: SI withing normal alignment, correction not required. Patient's Rt LE pain associated with positive nerual tension in Rt L as patient demonstrated positive neural tension for Rt peroneal deivision of sciativ nerve; follwoign nerve glides patient demosntrated no pain in LEs and states feeling much "looser." Though patient contineusto require cuing patient demosntrated good performance of all Exercises with no pain throughout session following  nerve glides. Anticipate possible discharge next session. PT Plan: Reassess next session in anticipation of possible discharge.     Goals PT Short Term Goals PT Short Term Goal 2: Patient will increase hip extesnion to 15  degrees to increase stride length durign gait PT Short Term Goal 2 - Progress: Progressing toward goal PT Long Term Goals Long Term Goal 3: Patient will be able to lift 10lb from floor without pain and with good technque 10 consecutive repetitions to be able to return to work without pain.  Long Term Goal 3 Progress: Progressing toward goal PT Long Term Goal 5: Patient will be able to demsontrate a 5/5 MMT for trunk flexion indicating improved abdominal strength for increase stability with lifting heavy objects Long Term Goal 5 Progress: Progressing toward goal PT Long Term Goal 6: Patient will demonstrate increased glut max strength of 4/5 MMT to improve hip strength to decrease strain on back with lifting Long Term Goal 6 Progress: Progressing toward goal  Problem List Patient Active Problem List   Diagnosis Date Noted  . Stiffness of joint, not elsewhere classified, pelvic region and thigh 09/04/2014  . Muscle weakness (generalized) 09/04/2014  . Pain in thoracic spine 09/04/2014  . Lumbago 09/04/2014  . Cervicalgia 09/04/2014  . Need for prophylactic vaccination and inoculation against influenza 09/03/2014  . Need for Tdap vaccination 09/03/2014  . Annual physical exam 05/25/2014  . Cervical neck pain with evidence of disc disease 05/24/2014  . Thoracic spine pain 05/23/2014  . Lumbar pain with radiation down both legs 05/23/2014  . Depression 03/26/2014  . SHINGLES 02/02/2010  . VITAMIN D DEFICIENCY 02/02/2010  . ELBOW PAIN, RIGHT 02/02/2010  . FATIGUE 02/02/2010  . OVERWEIGHT 04/20/2009  . DYSLIPIDEMIA 01/30/2009  . ALLERGIC RHINITIS CAUSE UNSPECIFIED 01/30/2009  . NEOPLASM, MALIGNANT, BREAST, RIGHT 04/12/2008  . OSTEOARTHRITIS, KNEES, BILATERAL 04/12/2008  . HYPERTENSION 01/05/2008    PT - End of Session Activity Tolerance: Patient tolerated treatment well General Behavior During Therapy: [facility]  for tasks assessed/performed  GP    Kendi Defalco R 10/22/2014, 9:26  AM

## 2014-10-24 ENCOUNTER — Ambulatory Visit (HOSPITAL_COMMUNITY)
Admission: RE | Admit: 2014-10-24 | Discharge: 2014-10-24 | Disposition: A | Discharge status: Home / Self Care | Payer: BC Managed Care – PPO | Source: Ambulatory Visit | Attending: Family Medicine | Admitting: Family Medicine

## 2014-10-24 DIAGNOSIS — Z5189 Encounter for other specified aftercare: Secondary | ICD-10-CM | POA: Diagnosis not present

## 2014-10-24 NOTE — Progress Notes (Signed)
Physical Therapy Re-evaluation/Treatment Note/Discharge summary  Patient Details  Name: Paula Randolph MRN: [id number] Date of Birth: 01/09/1960  Today's Date: 10/24/2014 Time: 2025-4270 PT Time Calculation (min): 42 min Charge TE [id number], 0910-0930; MMT/ROM Measurement 0900-0910              Visit#: 16 of 16  Re-eval: 10/31/14 Assessment Diagnosis: Back and neck pain.  Next MD Visit: Toole February Prior Therapy: no  Authorization: BCBS      Subjective Symptoms/Limitations Symptoms: Pt stated she has radicular symptoms down Rt posterior leg ending at heel How long can you sit comfortably?: 2-3 hours (was 44mn) How long can you stand comfortably?: 8 hours (was <339mutes) How long can you walk comfortably?: 90 minutes (was <301mtes) Pain Assessment Currently in Pain?: Yes Pain Score: 5  Pain Location: Leg Pain Orientation: Right;Posterior  Sensation/Coordination/Flexibility/Functional Tests Flexibility Thomas: Negative (was positive) 90/90: Negative Functional Tests Functional Tests: FOTO: 17% limited was 46% limited (FOTO: was 18% limited was 46% limited) Functional Tests: positive pirifomris test Lt, negative Rt. minimally limited (was positive pirifomris test bilaterally. minimally limited) Functional Tests: negative straight leg raise test for trunk instability (delayed).  (positive straight leg raise test for trunk instability (dela)  Assessment RLE Strength Right Hip Flexion:  (4+/5 was 4/5) Right Hip Extension: 3+/5 (was 2+/5) Right Hip External Rotation : 50 (was 45) Right Hip Internal Rotation : 48 (was 35) Right Knee Flexion: 5/5 Right Knee Extension: 5/5 Right Ankle Dorsiflexion: 5/5 LLE AROM (degrees) LLE Overall AROM Comments: negative ely's test (poitiive ely's test) LLE Strength Left Hip Flexion:  (4+/5 was 4/5) Left Hip Extension: 3+/5 (was 2+/5) Left Hip External Rotation : 56 (was 35) Left Hip Internal Rotation : 46 (was 35) Left Knee  Flexion: 5/5 (was 4/5) Left Knee Extension: 5/5 Left Ankle Dorsiflexion:  (was 4+/5) Cervical AROM Cervical Flexion: WNL Cervical Extension: WNL Cervical - Right Side Bend: WNL Cervical - Left Side Bend: WNL Cervical - Right Rotation: WNL Cervical - Left Rotation: WNL Lumbar AROM Lumbar Flexion: WNL Lumbar Extension: WNL Lumbar - Right Side Bend: WNL Lumbar - Left Side Bend: WNL Lumbar - Right Rotation: WNL Lumbar - Left Rotation: WNL Lumbar Strength Lumbar Flexion:  (was 4/5) Lumbar Extension:  (was 4/5)  Exercise/Treatments Stretches Active Hamstring Stretch: 3 reps;Limitations;20 seconds Active Hamstring Stretch Limitations: 14in box Hip Flexor Stretch: 3 reps;20 seconds Hip Flexor Stretch Limitations: 14in box Quad Stretch: 3 reps;30 seconds;Limitations Quad Stretch Limitations: Prone with rope Piriformis Stretch: 3 reps;Limitations;20 seconds Piriformis Stretch Limitations: seated  Standing Functional Squats: 20 reps Lifting: From floor;to overhead;10 reps;Weights Lifting Weights (lbs): 11 Forward Lunge: 10 reps Side Lunge: 10 reps;Limitations Side Lunge Limitations: on floor Bil LE Supine Bridge: 20 reps Prone  Straight Leg Raise: 10 reps Quadruped Opposite Arm/Leg Raise: 10 reps;Left arm/Right leg;Right arm/Left leg     Physical Therapy Assessment and Plan PT Assessment and Plan Clinical Impression Statement: Re-assessment complete with the following findings:  Pt has met all STGs and 4/6 LTGs.  Pt complaint with HEP and able to demonstrate/verbalize appropriate technique with all exercises.  ROM for neck, back and LEs WNL, pt. still positive with piriformis test.  Pt able to demonstrate appropriate form and technique with piriformis stretches.  Overall LE strength is improving, gluteal strength still weak.  Pt given advance HEP with primary focus on gluteal strengthening, pt able to demosntrate appropriate technique with all HEP and feels ready to begin  exercises independently.  Improved perceived functional abilities with FOTO score.  PT Plan: D/C to HEP.    Goals Home Exercise Program PT Goal: Perform Home Exercise Program - Progress: Met (Daily) PT Short Term Goals PT Short Term Goal 1: Patient will increase hip internal rotation to 30 degrees to increase shock absorbtion during gait. PT Short Term Goal 1 - Progress: Met PT Short Term Goal 2: Patient will increase hip extesnion to 15 degrees to increase stride length durign gait PT Short Term Goal 2 - Progress: Met PT Short Term Goal 3: Patient will increase cervical spine side bending to 45 degrees bilaterally to hold a phone against her shoudler PT Short Term Goal 3 - Progress: Met PT Short Term Goal 4: Patient will increase cervical spine rotation to >70 degrees bilaterally to increase ability to look over shoulder while driving.  PT Short Term Goal 4 - Progress: Met PT Short Term Goal 5: Patient will be able to extend > 30 degrees withtou pain PT Short Term Goal 5 - Progress: Met PT Long Term Goals PT Long Term Goal 1: patient will be able to bend down to touch toes without pain PT Long Term Goal 1 - Progress: Met PT Long Term Goal 2: Patient will be able to demsontrate a 4/5 MMT for trunk flexion indicating improved abdominal strength PT Long Term Goal 2 - Progress: Met Long Term Goal 3: Patient will be able to lift 10lb from floor without pain and with good technque 10 consecutive repetitions to be able to return to work without pain.  Long Term Goal 3 Progress: Met Long Term Goal 4: Patient will be able to walk, stand, sit > 1 hour without pain.  Long Term Goal 4 Progress: Met PT Long Term Goal 5: Patient will be able to demsontrate a 5/5 MMT for trunk flexion indicating improved abdominal strength for increase stability with lifting heavy objects Long Term Goal 5 Progress: Progressing toward goal PT Long Term Goal 6: Patient will demonstrate increased glut max strength of 4/5  MMT to improve hip strength to decrease strain on back with lifting Long Term Goal 6 Progress: Progressing toward goal  Problem List Patient Active Problem List   Diagnosis Date Noted  . Stiffness of joint, not elsewhere classified, pelvic region and thigh 09/04/2014  . Muscle weakness (generalized) 09/04/2014  . Pain in thoracic spine 09/04/2014  . Lumbago 09/04/2014  . Cervicalgia 09/04/2014  . Need for prophylactic vaccination and inoculation against influenza 09/03/2014  . Need for Tdap vaccination 09/03/2014  . Annual physical exam 05/25/2014  . Cervical neck pain with evidence of disc disease 05/24/2014  . Thoracic spine pain 05/23/2014  . Lumbar pain with radiation down both legs 05/23/2014  . Depression 03/26/2014  . SHINGLES 02/02/2010  . VITAMIN D DEFICIENCY 02/02/2010  . ELBOW PAIN, RIGHT 02/02/2010  . FATIGUE 02/02/2010  . OVERWEIGHT 04/20/2009  . DYSLIPIDEMIA 01/30/2009  . ALLERGIC RHINITIS CAUSE UNSPECIFIED 01/30/2009  . NEOPLASM, MALIGNANT, BREAST, RIGHT 04/12/2008  . OSTEOARTHRITIS, KNEES, BILATERAL 04/12/2008  . HYPERTENSION 01/05/2008    PT - End of Session Activity Tolerance: Patient tolerated treatment well General Behavior During Therapy: WFL for tasks assessed/performed PT Plan of Care PT Home Exercise Plan: squats, bridges, prone SLR, forward and side lunges, quadraped opposite arm/leg  GP    Aldona Lento 10/24/2014, 9:59 AM  Devona Konig PT DPT  Physician Documentation Your signature is required to indicate approval of the treatment plan as stated above.  Please sign and either send electronically or make a copy of this  report for your files and return this physician signed original.   Please mark one 1.__approve of plan  2. ___approve of plan with the following conditions.   ______________________________                                                          _____________________ Physician Signature                                                                                                              Date

## 2014-10-24 NOTE — Progress Notes (Signed)
Physical Therapy Re-evaluation/Treatment Note/Discharge summary  Patient Details  Name: Paula Randolph MRN: [id number] Date of Birth: 01/09/1960  Today's Date: 10/24/2014 Time: 0092-3300 PT Time Calculation (min): 42 min Charge TE [id number], 0910-0930; MMT/ROM Measurement 0900-0910              Visit#: 16 of 16  Re-eval: 10/31/14 Assessment Diagnosis: Back and neck pain.  Next MD Visit: [place] February Prior Therapy: no  Authorization: [employer]    Authorization Time Period:    Authorization Visit#:   of     Subjective Symptoms/Limitations Symptoms: Pt stated she has radicular symptoms down Rt posterior leg ending at heel How long can you sit comfortably?: 2-3 hours (was 56mn) How long can you stand comfortably?: 8 hours (was <319mutes) How long can you walk comfortably?: 90 minutes (was <3033mtes) Pain Assessment Currently in Pain?: Yes Pain Score: 5  Pain Location: Leg Pain Orientation: Right;Posterior  Sensation/Coordination/Flexibility/Functional Tests Flexibility Thomas: Negative (was positive) 90/90: Negative Functional Tests Functional Tests: FOTO: 17% limited was 46% limited (FOTO: was 18% limited was 46% limited) Functional Tests: positive pirifomris test Lt, negative Rt. minimally limited (was positive pirifomris test bilaterally. minimally limited) Functional Tests: negative straight leg raise test for trunk instability (delayed).  (positive straight leg raise test for trunk instability (dela)  Assessment RLE Strength Right Hip Flexion:  (4+/5 was 4/5) Right Hip Extension: 3+/5 (was 2+/5) Right Hip External Rotation : 50 (was 45) Right Hip Internal Rotation : 48 (was 35) Right Knee Flexion: 5/5 Right Knee Extension: 5/5 Right Ankle Dorsiflexion: 5/5 LLE AROM (degrees) LLE Overall AROM Comments: negative ely's test (poitiive ely's test) LLE Strength Left Hip Flexion:  (4+/5 was 4/5) Left Hip Extension: 3+/5 (was 2+/5) Left Hip External Rotation : 56 (was  35) Left Hip Internal Rotation : 46 (was 35) Left Knee Flexion: 5/5 (was 4/5) Left Knee Extension: 5/5 Left Ankle Dorsiflexion:  (was 4+/5) Cervical AROM Cervical Flexion: WNL Cervical Extension: WNL Cervical - Right Side Bend: WNL Cervical - Left Side Bend: WNL Cervical - Right Rotation: WNL Cervical - Left Rotation: WNL Lumbar AROM Lumbar Flexion: WNL Lumbar Extension: WNL Lumbar - Right Side Bend: WNL Lumbar - Left Side Bend: WNL Lumbar - Right Rotation: WNL Lumbar - Left Rotation: WNL Lumbar Strength Lumbar Flexion:  (was 4/5) Lumbar Extension:  (was 4/5)  Exercise/Treatments Stretches Active Hamstring Stretch: 3 reps;Limitations;20 seconds Active Hamstring Stretch Limitations: 14in box Hip Flexor Stretch: 3 reps;20 seconds Hip Flexor Stretch Limitations: 14in box Quad Stretch: 3 reps;30 seconds;Limitations Quad Stretch Limitations: Prone with rope Piriformis Stretch: 3 reps;Limitations;20 seconds Piriformis Stretch Limitations: seated  Standing Functional Squats: 20 reps Lifting: From floor;to overhead;10 reps;Weights Lifting Weights (lbs): 11 Forward Lunge: 10 reps Side Lunge: 10 reps;Limitations Side Lunge Limitations: on floor Bil LE Supine Bridge: 20 reps Prone  Straight Leg Raise: 10 reps Quadruped Opposite Arm/Leg Raise: 10 reps;Left arm/Right leg;Right arm/Left leg     Physical Therapy Assessment and Plan PT Assessment and Plan Clinical Impression Statement: Re-assessment complete with the following findings:  Pt has met all STGs and 4/6 LTGs.  Pt complaint with HEP and able to demonstrate/verbalize appropriate technique with all exercises.  ROM for neck, back and LEs WNL, pt. still positive with piriformis test.  Pt able to demonstrate appropriate form and technique with piriformis stretches.  Overall LE strength is improving, gluteal strength still weak.  Pt given advance HEP with primary focus on gluteal strengthening, pt able to demosntrate  appropriate technique with all HEP and feels  ready to begin exercises independently.  Improved perceived functional abilities with FOTO score.   PT Plan: D/C to HEP.    Goals Home Exercise Program PT Goal: Perform Home Exercise Program - Progress: Met (Daily) PT Short Term Goals PT Short Term Goal 1: Patient will increase hip internal rotation to 30 degrees to increase shock absorbtion during gait. PT Short Term Goal 1 - Progress: Met PT Short Term Goal 2: Patient will increase hip extesnion to 15 degrees to increase stride length durign gait PT Short Term Goal 2 - Progress: Met PT Short Term Goal 3: Patient will increase cervical spine side bending to 45 degrees bilaterally to hold a phone against her shoudler PT Short Term Goal 3 - Progress: Met PT Short Term Goal 4: Patient will increase cervical spine rotation to >70 degrees bilaterally to increase ability to look over shoulder while driving.  PT Short Term Goal 4 - Progress: Met PT Short Term Goal 5: Patient will be able to extend > 30 degrees withtou pain PT Short Term Goal 5 - Progress: Met PT Long Term Goals PT Long Term Goal 1: patient will be able to bend down to touch toes without pain PT Long Term Goal 1 - Progress: Met PT Long Term Goal 2: Patient will be able to demsontrate a 4/5 MMT for trunk flexion indicating improved abdominal strength PT Long Term Goal 2 - Progress: Met Long Term Goal 3: Patient will be able to lift 10lb from floor without pain and with good technque 10 consecutive repetitions to be able to return to work without pain.  Long Term Goal 3 Progress: Met Long Term Goal 4: Patient will be able to walk, stand, sit > 1 hour without pain.  Long Term Goal 4 Progress: Met PT Long Term Goal 5: Patient will be able to demsontrate a 5/5 MMT for trunk flexion indicating improved abdominal strength for increase stability with lifting heavy objects Long Term Goal 5 Progress: Progressing toward goal PT Long Term Goal 6:  Patient will demonstrate increased glut max strength of 4/5 MMT to improve hip strength to decrease strain on back with lifting Long Term Goal 6 Progress: Progressing toward goal  Problem List Patient Active Problem List   Diagnosis Date Noted  . Stiffness of joint, not elsewhere classified, pelvic region and thigh 09/04/2014  . Muscle weakness (generalized) 09/04/2014  . Pain in thoracic spine 09/04/2014  . Lumbago 09/04/2014  . Cervicalgia 09/04/2014  . Need for prophylactic vaccination and inoculation against influenza 09/03/2014  . Need for Tdap vaccination 09/03/2014  . Annual physical exam 05/25/2014  . Cervical neck pain with evidence of disc disease 05/24/2014  . Thoracic spine pain 05/23/2014  . Lumbar pain with radiation down both legs 05/23/2014  . Depression 03/26/2014  . SHINGLES 02/02/2010  . VITAMIN D DEFICIENCY 02/02/2010  . ELBOW PAIN, RIGHT 02/02/2010  . FATIGUE 02/02/2010  . OVERWEIGHT 04/20/2009  . DYSLIPIDEMIA 01/30/2009  . ALLERGIC RHINITIS CAUSE UNSPECIFIED 01/30/2009  . NEOPLASM, MALIGNANT, BREAST, RIGHT 04/12/2008  . OSTEOARTHRITIS, KNEES, BILATERAL 04/12/2008  . HYPERTENSION 01/05/2008    PT - End of Session Activity Tolerance: Patient tolerated treatment well General Behavior During Therapy: WFL for tasks assessed/performed PT Plan of Care PT Home Exercise Plan: squats, bridges, prone SLR, forward and side lunges, quadraped opposite arm/leg  GP    Aldona Lento 10/24/2014, 9:59 AM  Physician Documentation Your signature is required to indicate approval of the treatment plan as stated above.  Please sign  and either send electronically or make a copy of this report for your files and return this physician signed original.   Please mark one 1.__approve of plan  2. ___approve of plan with the following conditions.   ______________________________                                                          _____________________ Physician  Signature                                                                                                             Date

## 2014-11-11 ENCOUNTER — Ambulatory Visit (INDEPENDENT_AMBULATORY_CARE_PROVIDER_SITE_OTHER): Payer: BC Managed Care – PPO | Admitting: Family Medicine

## 2014-11-11 ENCOUNTER — Encounter (INDEPENDENT_AMBULATORY_CARE_PROVIDER_SITE_OTHER): Payer: Self-pay

## 2014-11-11 ENCOUNTER — Encounter: Payer: Self-pay | Admitting: Family Medicine

## 2014-11-11 VITALS — BP 128/82 | HR 76 | Resp 18 | Ht 64.0 in | Wt 168.1 lb

## 2014-11-11 DIAGNOSIS — D509 Iron deficiency anemia, unspecified: Secondary | ICD-10-CM

## 2014-11-11 DIAGNOSIS — M79604 Pain in right leg: Secondary | ICD-10-CM

## 2014-11-11 DIAGNOSIS — F329 Major depressive disorder, single episode, unspecified: Secondary | ICD-10-CM

## 2014-11-11 DIAGNOSIS — I10 Essential (primary) hypertension: Secondary | ICD-10-CM

## 2014-11-11 DIAGNOSIS — M545 Low back pain: Secondary | ICD-10-CM

## 2014-11-11 DIAGNOSIS — F32A Depression, unspecified: Secondary | ICD-10-CM

## 2014-11-11 DIAGNOSIS — E663 Overweight: Secondary | ICD-10-CM

## 2014-11-11 NOTE — Assessment & Plan Note (Signed)
Marked improvement following injections and physical therapy

## 2014-11-11 NOTE — Patient Instructions (Signed)
F/u in 4 month, call if you need me before  Thankful you feel much better now that pain is controlled  Fasting chem 7 CBC , iron and ferritin in 4 month

## 2014-11-11 NOTE — Assessment & Plan Note (Signed)
Improved. Pt applauded on succesful weight loss through lifestyle change, and encouraged to continue same. Weight loss goal set for the next several months.  

## 2014-11-11 NOTE — Assessment & Plan Note (Signed)
Resolved, and on no medication

## 2014-11-11 NOTE — Assessment & Plan Note (Signed)
Controlled, no change in medication DASH diet and commitment to daily physical activity for a minimum of 30 minutes discussed and encouraged, as a part of hypertension management. The importance of attaining a healthy weight is also discussed.  

## 2014-11-11 NOTE — Progress Notes (Signed)
   Subjective:    Patient ID: Paula Randolph, female    DOB: 01/09/1960, 53 y.o.   MRN: [id number]  HPI The PT is here for follow up and re-evaluation of chronic medical conditions, medication management and review of any available recent lab and radiology data.  Preventive health is updated, specifically  Cancer screening and Immunization.   Questions or concerns regarding consultations or procedures which the PT has had in the interim are  addressed. The PT denies any adverse reactions to current medications since the last visit.  There are no new concerns.  There are no specific complaints       Review of Systems See HPI Denies recent fever or chills. Denies sinus pressure, nasal congestion, ear pain or sore throat. Denies chest congestion, productive cough or wheezing. Denies chest pains, palpitations and leg swelling Denies abdominal pain, nausea, vomiting,diarrhea or constipation.   Denies dysuria, frequency, hesitancy or incontinence. Denies joint pain, swelling and limitation in mobility. Denies headaches, seizures, numbness, or tingling. Denies depression, anxiety or insomnia. Denies skin break down or rash.        Objective:   Physical Exam BP 128/82 mmHg  Pulse 76  Resp 18  Ht 5\' 4"  (1.626 m)  Wt 168 lb 1.3 oz (76.241 kg)  BMI 28.84 kg/m2  SpO2 100% Patient alert and oriented and in no cardiopulmonary distress.  HEENT: No facial asymmetry, EOMI,   oropharynx pink and moist.  Neck supple no JVD, no mass.  Chest: Clear to auscultation bilaterally.  CVS: S1, S2 no murmurs, no S3.Regular rate.  ABD: Soft non tender.   Ext: No edema  MS: Adequate ROM spine, shoulders, hips and knees.  Skin: Intact, no ulcerations or rash noted.  Psych: Good eye contact, normal affect. Memory intact not anxious or depressed appearing.  CNS: CN 2-12 intact, power,  normal throughout.no focal deficits noted.        Assessment & Plan:  Essential  hypertension Controlled, no change in medication DASH diet and commitment to daily physical activity for a minimum of 30 minutes discussed and encouraged, as a part of hypertension management. The importance of attaining a healthy weight is also discussed.   Lumbar pain with radiation down both legs Marked improvement following injections and physical therapy  Overweight with body mass index (BMI) 25.0-29.9 Improved. Pt applauded on succesful weight loss through lifestyle change, and encouraged to continue same. Weight loss goal set for the next several months.   Depression Resolved, and on no medication

## 2015-02-25 LAB — BASIC METABOLIC PANEL
BUN: 24 mg/dL — ABNORMAL HIGH (ref 6–23)
CHLORIDE: 102 meq/L (ref 96–112)
CO2: 28 meq/L (ref 19–32)
CREATININE: 0.73 mg/dL (ref 0.50–1.10)
Calcium: 9.6 mg/dL (ref 8.4–10.5)
Glucose, Bld: 95 mg/dL (ref 70–99)
Potassium: 4.1 mEq/L (ref 3.5–5.3)
SODIUM: 140 meq/L (ref 135–145)

## 2015-02-25 LAB — CBC
HCT: 36.9 % (ref 36.0–46.0)
HEMOGLOBIN: 11.7 g/dL — AB (ref 12.0–15.0)
MCH: 26.7 pg (ref 26.0–34.0)
MCHC: 31.7 g/dL (ref 30.0–36.0)
MCV: 84.1 fL (ref 78.0–100.0)
MPV: 10.2 fL (ref 9.4–12.4)
Platelets: 416 10*3/uL — ABNORMAL HIGH (ref 150–400)
RBC: 4.39 MIL/uL (ref 3.87–5.11)
RDW: 12.7 % (ref 11.5–15.5)
WBC: 5.7 10*3/uL (ref 4.0–10.5)

## 2015-02-25 LAB — FERRITIN: FERRITIN: 118 ng/mL (ref 10–291)

## 2015-02-25 LAB — IRON: Iron: 115 ug/dL (ref 42–145)

## 2015-03-10 ENCOUNTER — Ambulatory Visit: Payer: Self-pay | Admitting: Oncology

## 2015-03-18 ENCOUNTER — Encounter: Payer: Self-pay | Admitting: Family Medicine

## 2015-03-18 ENCOUNTER — Ambulatory Visit: Payer: 59 | Admitting: Family Medicine

## 2015-03-18 VITALS — BP 144/84 | HR 75 | Resp 16 | Ht 64.0 in | Wt 175.0 lb

## 2015-03-18 DIAGNOSIS — R7301 Impaired fasting glucose: Secondary | ICD-10-CM

## 2015-03-18 DIAGNOSIS — E66811 Obesity, class 1: Secondary | ICD-10-CM

## 2015-03-18 DIAGNOSIS — Z1159 Encounter for screening for other viral diseases: Secondary | ICD-10-CM | POA: Diagnosis not present

## 2015-03-18 DIAGNOSIS — M5090 Cervical disc disorder, unspecified, unspecified cervical region: Secondary | ICD-10-CM | POA: Diagnosis not present

## 2015-03-18 DIAGNOSIS — I10 Essential (primary) hypertension: Secondary | ICD-10-CM

## 2015-03-18 DIAGNOSIS — E669 Obesity, unspecified: Secondary | ICD-10-CM

## 2015-03-18 NOTE — Patient Instructions (Addendum)
Annual exam in 4 month, call if you need me before  Blood pressure slightly up as is your weight , please consistently work at healthy lifestyle, and enjoy!  We will send to [facility] for your mammo report  HBA1C, chem 7 and EGFR, HIV,  non fasting in 4 month  Take BP med every day at same  time  Weight loss goal of 6 to 8 pounds

## 2015-03-19 ENCOUNTER — Other Ambulatory Visit: Payer: Self-pay

## 2015-03-19 DIAGNOSIS — I10 Essential (primary) hypertension: Secondary | ICD-10-CM

## 2015-03-19 MED ORDER — TRIAMTERENE-HCTZ 37.5-25 MG PO TABS: 1.0000 | ORAL_TABLET | Freq: Every day | ORAL | Status: DC

## 2015-03-27 ENCOUNTER — Ambulatory Visit
Admit: 2015-03-27 | Disposition: A | Discharge status: Home / Self Care | Payer: Self-pay | Attending: Oncology | Admitting: Oncology

## 2015-03-28 ENCOUNTER — Ambulatory Visit
Admit: 2015-03-28 | Disposition: A | Discharge status: Home / Self Care | Payer: Self-pay | Attending: Oncology | Admitting: Oncology

## 2015-04-27 NOTE — Assessment & Plan Note (Signed)
Managed by pain management, experiences pain flares at times, on gabapentin and muscle relaxant

## 2015-04-27 NOTE — Assessment & Plan Note (Signed)
Uncontrolled, takes med inconsistently, re educated re need to chnge this DASH diet and commitment to daily physical activity for a minimum of 30 minutes discussed and encouraged, as a part of hypertension management. The importance of attaining a healthy weight is also discussed.  BP/Weight 03/18/2015 11/11/2014 09/03/2014 05/23/2014 05/10/2014 02/07/2481 5/0/0370  Systolic BP 488 891 694 503 98 888 280  Diastolic BP 84 82 84 78 61 86 80  Wt. (Lbs) 175 168.08 166 168 178 171.04 180  BMI 30.02 28.84 28.48 28.82 30.54 29.82 30.88

## 2015-04-27 NOTE — Progress Notes (Signed)
   Subjective:    Patient ID: Paula Randolph, female    DOB: Oct 02, 1961, 54 y.o.   MRN: [id number]  HPI   Paula Randolph     MRN: [id number]      DOB: October 31, 1961   HPI Paula Randolph is here for follow up and re-evaluation of chronic medical conditions, medication management and review of any available recent lab and radiology data.  Preventive health is updated, specifically  Cancer screening and Immunization.   Questions or concerns regarding consultations or procedures which the PT has had in the interim are  addressed. The PT denies any adverse reactions to current medications since the last visit.  There are no new concerns.  There are no specific complaints   ROS Denies recent fever or chills. Denies sinus pressure, nasal congestion, ear pain or sore throat. Denies chest congestion, productive cough or wheezing. Denies chest pains, palpitations and leg swelling Denies abdominal pain, nausea, vomiting,diarrhea or constipation.   Denies dysuria, frequency, hesitancy or incontinence. Denies joint pain, swelling and limitation in mobility. Denies headaches, seizures, numbness, or tingling. Denies depression, anxiety or insomnia. Denies skin break down or rash.   PE  BP 144/84 mmHg  Pulse 75  Resp 16  Ht 5\' 4"  (1.626 m)  Wt 175 lb (79.379 kg)  BMI 30.02 kg/m2  SpO2 100%  Patient alert and oriented and in no cardiopulmonary distress.  HEENT: No facial asymmetry, EOMI,   oropharynx pink and moist.  Neck supple no JVD, no mass.  Chest: Clear to auscultation bilaterally.  CVS: S1, S2 no murmurs, no S3.Regular rate.  ABD: Soft non tender.   Ext: No edema  MS: Adequate ROM spine, shoulders, hips and knees.  Skin: Intact, no ulcerations or rash noted.  Psych: Good eye contact, normal affect. Memory intact not anxious or depressed appearing.  CNS: CN 2-12 intact, power,  normal throughout.no focal deficits noted.   Assessment & Plan   Essential  hypertension Uncontrolled, takes med inconsistently, re educated re need to chnge this DASH diet and commitment to daily physical activity for a minimum of 30 minutes discussed and encouraged, as a part of hypertension management. The importance of attaining a healthy weight is also discussed.  BP/Weight 03/18/2015 11/11/2014 09/03/2014 05/23/2014 05/10/2014 04/27/7740 02/03/7866  Systolic BP 672 094 709 628 98 366 294  Diastolic BP 84 82 84 78 61 86 80  Wt. (Lbs) 175 168.08 166 168 178 171.04 180  BMI 30.02 28.84 28.48 28.82 30.54 29.82 30.88         Cervical neck pain with evidence of disc disease Managed by pain management, experiences pain flares at times, on gabapentin and muscle relaxant   Obesity (BMI 30.0-34.9) Deteriorated. Patient re-educated about  the importance of commitment to a  minimum of 150 minutes of exercise per week.  The importance of healthy food choices with portion control discussed. Encouraged to start a food diary, count calories and to consider  joining a support group. Sample diet sheets offered. Goals set by the patient for the next several months.   Weight /BMI 03/18/2015 11/11/2014 09/03/2014  WEIGHT 175 lb 168 lb 1.3 oz 166 lb  HEIGHT 5\' 4"  5\' 4"  -  BMI 30.02 kg/m2 28.84 kg/m2 28.48 kg/m2    Current exercise per week 150 minutes.         Review of Systems     Objective:   Physical Exam        Assessment & Plan:

## 2015-04-27 NOTE — Assessment & Plan Note (Signed)
Deteriorated. Patient re-educated about  the importance of commitment to a  minimum of 150 minutes of exercise per week.  The importance of healthy food choices with portion control discussed. Encouraged to start a food diary, count calories and to consider  joining a support group. Sample diet sheets offered. Goals set by the patient for the next several months.   Weight /BMI 03/18/2015 11/11/2014 09/03/2014  WEIGHT 175 lb 168 lb 1.3 oz 166 lb  HEIGHT 5\' 4"  5\' 4"  -  BMI 30.02 kg/m2 28.84 kg/m2 28.48 kg/m2    Current exercise per week 150 minutes.

## 2015-05-01 ENCOUNTER — Other Ambulatory Visit: Payer: Self-pay | Admitting: Oncology

## 2015-05-01 DIAGNOSIS — C50919 Malignant neoplasm of unspecified site of unspecified female breast: Secondary | ICD-10-CM

## 2015-06-11 ENCOUNTER — Other Ambulatory Visit: Payer: Self-pay | Admitting: Oncology

## 2015-06-11 DIAGNOSIS — Z1231 Encounter for screening mammogram for malignant neoplasm of breast: Secondary | ICD-10-CM

## 2015-07-08 LAB — BASIC METABOLIC PANEL WITH GFR
BUN: 15 mg/dL (ref 6–23)
CO2: 26 mEq/L (ref 19–32)
CREATININE: 0.69 mg/dL (ref 0.50–1.10)
Calcium: 9.4 mg/dL (ref 8.4–10.5)
Chloride: 104 mEq/L (ref 96–112)
GFR, Est Non African American: >89 mL/min
Glucose, Bld: 83 mg/dL (ref 70–99)
Potassium: 4.3 mEq/L (ref 3.5–5.3)
Sodium: 144 mEq/L (ref 135–145)

## 2015-07-08 LAB — HEMOGLOBIN A1C
HEMOGLOBIN A1C: 5.2 % (ref <5.7)
Mean Plasma Glucose: 103 mg/dL (ref <117)

## 2015-07-08 LAB — HIV ANTIBODY (ROUTINE TESTING W REFLEX): HIV: NONREACTIVE

## 2015-08-10 ENCOUNTER — Emergency Department (HOSPITAL_COMMUNITY)
Admission: EM | Admit: 2015-08-10 | Discharge: 2015-08-10 | Disposition: A | Discharge status: Home / Self Care | Payer: 59 | Attending: Emergency Medicine | Admitting: Emergency Medicine

## 2015-08-10 ENCOUNTER — Encounter (HOSPITAL_COMMUNITY): Payer: Self-pay | Admitting: Emergency Medicine

## 2015-08-10 DIAGNOSIS — Z853 Personal history of malignant neoplasm of breast: Secondary | ICD-10-CM | POA: Insufficient documentation

## 2015-08-10 DIAGNOSIS — I10 Essential (primary) hypertension: Secondary | ICD-10-CM | POA: Diagnosis not present

## 2015-08-10 DIAGNOSIS — H109 Unspecified conjunctivitis: Secondary | ICD-10-CM | POA: Diagnosis not present

## 2015-08-10 DIAGNOSIS — Z79899 Other long term (current) drug therapy: Secondary | ICD-10-CM | POA: Diagnosis not present

## 2015-08-10 DIAGNOSIS — M170 Bilateral primary osteoarthritis of knee: Secondary | ICD-10-CM | POA: Insufficient documentation

## 2015-08-10 DIAGNOSIS — H578 Other specified disorders of eye and adnexa: Secondary | ICD-10-CM | POA: Diagnosis present

## 2015-08-10 DIAGNOSIS — E663 Overweight: Secondary | ICD-10-CM | POA: Insufficient documentation

## 2015-08-10 DIAGNOSIS — Z87828 Personal history of other (healed) physical injury and trauma: Secondary | ICD-10-CM | POA: Diagnosis not present

## 2015-08-10 MED ORDER — TETRACAINE HCL 0.5 % OP SOLN
2.0000 [drp] | Freq: Once | OPHTHALMIC | Status: AC
  Administered 2015-08-10: 2 [drp] via OPHTHALMIC
  Filled 2015-08-10: qty 2

## 2015-08-10 MED ORDER — FLUORESCEIN SODIUM 1 MG OP STRP
ORAL_STRIP | OPHTHALMIC | Status: AC
  Administered 2015-08-10: 14:00:00
  Filled 2015-08-10: qty 1

## 2015-08-10 MED ORDER — SULFACETAMIDE SODIUM 10 % OP SOLN: 1.0000 [drp] | OPHTHALMIC | Status: AC

## 2015-08-10 NOTE — Discharge Instructions (Signed)
See eye doctor if no improvement in 48 hrs.   If you were given medicines take as directed.  If you are on coumadin or contraceptives realize their levels and effectiveness is altered by many different medicines.  If you have any reaction (rash, tongues swelling, other) to the medicines stop taking and see a physician.    If your blood pressure was elevated in the ER make sure you follow up for management with a primary doctor or return for chest pain, shortness of breath or stroke symptoms.  Please follow up as directed and return to the ER or see a physician for new or worsening symptoms.  Thank you. Filed Vitals:   08/10/15 1041  BP: 160/82  Pulse: 76  Temp: 97.8 F (36.6 C)  TempSrc: Oral  Resp: 18  Height: 5\' 4"  (1.626 m)  Weight: 184 lb (83.462 kg)  SpO2: 99%

## 2015-08-10 NOTE — ED Notes (Signed)
Pt reports woke up this am with right eye pain,draiange, redness. nad noted.

## 2015-08-10 NOTE — ED Provider Notes (Signed)
CSN: [id number]     Arrival date & time 08/10/15  1028 History  This chart was scribed for Elnora Morrison, MD by Zola Button, ED Scribe. This patient was seen in room [facility] and the patient's care was started at 12:50 PM.      Chief Complaint  Patient presents with  . Eye Drainage   The history is provided by the patient. No language interpreter was used.   HPI Comments: Paula Randolph is a 54 y.o. female who presents to the Emergency Department complaining of eye drainage since this morning. No history of similar. No injuries to the eye, patient does not contact lenses, no history of eye issues no vision loss. No joint pains or other symptoms. No pain with moving the eye.     Past Medical History  Diagnosis Date  . Hypertension   . Vitamin D deficiency   . Dyslipidemia   . Overweight(278.02)   . Allergy   . Primary osteoarthritis of both knees   . Right arm pain   . Trochanteric bursitis   . Strain of lumbar spine   . Cancer     right breast    Past Surgical History  Procedure Laterality Date  . Right breast     . Mastectomy    . Appendectomy    . Abdominal hysterectomy      fibroids  . Breast surgery Right 2008    partial  . Colonoscopy N/A 05/10/2014    Procedure: COLONOSCOPY;  Surgeon: Daneil Dolin, MD;  Location: [facility];  Service: Endoscopy;  Laterality: N/A;  8:30 AM   Family History  Problem Relation Age of Onset  . Cancer Mother     breast   . Diabetes Father   . Diabetes Brother   . Diabetes Sister    Social History  Substance Use Topics  . Smoking status: Never Smoker   . Smokeless tobacco: None  . Alcohol Use: No   OB History    No data available     Review of Systems  Constitutional: Negative for fever and chills.  Eyes: Positive for pain, discharge, redness and itching.  Gastrointestinal: Negative for vomiting.  Musculoskeletal: Negative for arthralgias.  Neurological: Negative for weakness and numbness.      Allergies   Review of patient's allergies indicates no known allergies.  Home Medications   Prior to Admission medications   Medication Sig Start Date End Date Taking? Authorizing Provider  baclofen (LIORESAL) 10 MG tablet Take 10 mg by mouth daily as needed for muscle spasms.   Yes Historical Provider, MD  gabapentin (NEURONTIN) 400 MG capsule Take 400 mg by mouth 2 (two) times daily.   Yes Historical Provider, MD  Multiple Vitamin (MULTIVITAMIN WITH MINERALS) TABS tablet Take 1 tablet by mouth daily.   Yes Historical Provider, MD  triamterene-hydrochlorothiazide (MAXZIDE-25) 37.5-25 MG per tablet Take 1 tablet by mouth daily. 03/19/15  Yes Fayrene Helper, MD  sulfacetamide (BLEPH-10) 10 % ophthalmic solution Place 1-2 drops into the right eye every 4 (four) hours. 08/10/15 08/14/15  Elnora Morrison, MD   BP 160/82 mmHg  Pulse 76  Temp(Src) 97.8 F (36.6 C) (Oral)  Resp 18  Ht 5\' 4"  (1.626 m)  Wt 184 lb (83.462 kg)  BMI 31.57 kg/m2  SpO2 99% Physical Exam  Constitutional: She is oriented to person, place, and time. She appears well-developed and well-nourished. No distress.  HENT:  Head: Normocephalic and atraumatic.  Mouth/Throat: Oropharynx is clear and moist. No  oropharyngeal exudate.  Eyes: EOM are normal. Pupils are equal, round, and reactive to light. Right conjunctiva is injected.  Watery right eye, clear. No purulence seen. Mild conjunctival injection. No significant swelling periorbital. Visual field intact to finger.  Neck: Neck supple.  Cardiovascular: Normal rate.   Pulmonary/Chest: Effort normal.  Musculoskeletal: She exhibits no edema.  Neurological: She is alert and oriented to person, place, and time. No cranial nerve deficit.  Skin: Skin is warm and dry. No rash noted.  Psychiatric: She has a normal mood and affect. Her behavior is normal.  Nursing note and vitals reviewed.   ED Course  Procedures  DIAGNOSTIC STUDIES: Oxygen Saturation is 99% on room air, normal by my  interpretation.    COORDINATION OF CARE: 12:53 PM-Discussed treatment plan which includes optho fup with patient/guardian at bedside and patient/guardian agreed to plan.    Labs Review Labs Reviewed - No data to display  Imaging Review No results found. I personally reviewed and evaluated these images and lab results as part of my medical decision-making.   EKG Interpretation None      MDM   Final diagnoses:  Conjunctivitis, right eye   Patient presents with mild right eye drainage and irritation. Discussed differential diagnosis, normal intraocular pressures less than 10, no concern for significant infections this time and no corneal abrasion on staining. Patient comfortable with eyedrops and follow up with ophthalmologist if no improvement 48 hours.  Results and differential diagnosis were discussed with the patient/parent/guardian. Xrays were independently reviewed by myself.  Close follow up outpatient was discussed, comfortable with the plan.   Medications  tetracaine (PONTOCAINE) 0.5 % ophthalmic solution 2 drop (not administered)  fluorescein 1 MG ophthalmic strip (not administered)    Filed Vitals:   08/10/15 1041  BP: 160/82  Pulse: 76  Temp: 97.8 F (36.6 C)  TempSrc: Oral  Resp: 18  Height: 5\' 4"  (1.626 m)  Weight: 184 lb (83.462 kg)  SpO2: 99%    Final diagnoses:  Conjunctivitis, right eye      Elnora Morrison, MD 08/10/15 1340

## 2015-08-11 ENCOUNTER — Encounter: Payer: Self-pay | Admitting: Family Medicine

## 2015-08-11 ENCOUNTER — Ambulatory Visit (INDEPENDENT_AMBULATORY_CARE_PROVIDER_SITE_OTHER): Payer: 59 | Admitting: Family Medicine

## 2015-08-11 ENCOUNTER — Other Ambulatory Visit (HOSPITAL_COMMUNITY)
Admission: RE | Admit: 2015-08-11 | Discharge: 2015-08-11 | Disposition: A | Discharge status: Home / Self Care | Payer: 59 | Source: Ambulatory Visit | Attending: Family Medicine | Admitting: Family Medicine

## 2015-08-11 VITALS — BP 134/76 | HR 80 | Resp 18 | Ht 64.0 in | Wt 174.1 lb

## 2015-08-11 DIAGNOSIS — Z01411 Encounter for gynecological examination (general) (routine) with abnormal findings: Secondary | ICD-10-CM | POA: Diagnosis not present

## 2015-08-11 DIAGNOSIS — N3001 Acute cystitis with hematuria: Secondary | ICD-10-CM | POA: Diagnosis not present

## 2015-08-11 DIAGNOSIS — Z124 Encounter for screening for malignant neoplasm of cervix: Secondary | ICD-10-CM

## 2015-08-11 DIAGNOSIS — I10 Essential (primary) hypertension: Secondary | ICD-10-CM

## 2015-08-11 DIAGNOSIS — Z1211 Encounter for screening for malignant neoplasm of colon: Secondary | ICD-10-CM | POA: Diagnosis not present

## 2015-08-11 DIAGNOSIS — Z0000 Encounter for general adult medical examination without abnormal findings: Secondary | ICD-10-CM | POA: Diagnosis not present

## 2015-08-11 DIAGNOSIS — M545 Low back pain: Secondary | ICD-10-CM

## 2015-08-11 DIAGNOSIS — N390 Urinary tract infection, site not specified: Secondary | ICD-10-CM | POA: Insufficient documentation

## 2015-08-11 DIAGNOSIS — M79604 Pain in right leg: Secondary | ICD-10-CM

## 2015-08-11 DIAGNOSIS — M79605 Pain in left leg: Secondary | ICD-10-CM

## 2015-08-11 DIAGNOSIS — N3000 Acute cystitis without hematuria: Secondary | ICD-10-CM | POA: Insufficient documentation

## 2015-08-11 LAB — POCT URINALYSIS DIPSTICK
Bilirubin, UA: NEGATIVE
Glucose, UA: NEGATIVE
KETONES UA: NEGATIVE
Nitrite, UA: POSITIVE
PROTEIN UA: NEGATIVE
Urobilinogen, UA: 0.2
pH, UA: 6

## 2015-08-11 LAB — HEMOCCULT GUIAC POC 1CARD (OFFICE): Fecal Occult Blood, POC: NEGATIVE

## 2015-08-11 MED ORDER — IBUPROFEN 800 MG PO TABS: 800.0000 mg | ORAL_TABLET | Freq: Three times a day (TID) | ORAL | Status: DC | PRN

## 2015-08-11 MED ORDER — PREDNISONE 5 MG (21) PO TBPK: ORAL_TABLET | ORAL | Status: DC

## 2015-08-11 MED ORDER — METHYLPREDNISOLONE ACETATE 80 MG/ML IJ SUSP
80.0000 mg | Freq: Once | INTRAMUSCULAR | Status: AC
  Administered 2015-08-11: 80 mg via INTRAMUSCULAR

## 2015-08-11 MED ORDER — KETOROLAC TROMETHAMINE 60 MG/2ML IM SOLN
60.0000 mg | Freq: Once | INTRAMUSCULAR | Status: AC
  Administered 2015-08-11: 60 mg via INTRAMUSCULAR

## 2015-08-11 MED ORDER — TRIAMTERENE-HCTZ 37.5-25 MG PO TABS: 1.0000 | ORAL_TABLET | Freq: Every day | ORAL | Status: DC

## 2015-08-11 NOTE — Assessment & Plan Note (Signed)

## 2015-08-11 NOTE — Assessment & Plan Note (Signed)
Uncontrolled.Toradol and depo medrol administered IM in the office , to be followed by a short course of oral prednisone and NSAIDS.  

## 2015-08-11 NOTE — Patient Instructions (Signed)
F/u in 5.5 month, call for flu vaccine in September  It is important that you exercise regularly at least 30 minutes 5 times a week. If you develop chest pain, have severe difficulty breathing, or feel very tired, stop exercising immediately and seek medical attention   .Please work on good  health habits so that your health will improve. 1. Commitment to daily physical activity for 30 to 60  minutes, if you are able to do this.  2. Commitment to wise food choices. Aim for half of your  food intake to be vegetable and fruit, one quarter starchy foods, and one quarter protein. Try to eat on a regular schedule  3 meals per day, snacking between meals should be limited to vegetables or fruits or small portions of nuts. 64 ounces of water per day is generally recommended, unless you have specific health conditions, like heart failure or kidney failure where you will need to limit fluid intake.  3. Commitment to sufficient and a  good quality of physical and mental rest daily, generally between 6 to 8 hours per day.  WITH PERSISTANCE AND PERSEVERANCE, THE IMPOSSIBLE , BECOMES THE NORM!  Injections and short course of medication sent for back pain  We will call when results for urine are available, appears that you do have a UTI

## 2015-08-12 LAB — CYTOLOGY - PAP

## 2015-08-14 LAB — URINE CULTURE: Colony Count: >=100000

## 2015-08-14 MED ORDER — CIPROFLOXACIN HCL 500 MG PO TABS: 500.0000 mg | ORAL_TABLET | Freq: Two times a day (BID) | ORAL | Status: DC

## 2015-09-01 ENCOUNTER — Encounter: Payer: Self-pay | Admitting: Family Medicine

## 2015-09-01 NOTE — Assessment & Plan Note (Signed)
Symptomatic with abnormal urine , will f/u on c/s

## 2015-09-01 NOTE — Progress Notes (Signed)
   Subjective:    Patient ID: Paula Randolph, female    DOB: 1961-02-25, 54 y.o.   MRN: [id number]  HPI Patient is in for annual physical exam. C/o increased and uncontrolled back pain radiating to calf, no specific aggravating factor, wants injections for this, has benefited in the past from both IM and epidural injectioins C/o urinary frequency and burning for past 5 days, denies fever, chills or flank pain . Immunization is reviewed , and  updated if needed.    Review of Systems See HPI     Objective:   Physical Exam  BP 134/76 mmHg  Pulse 80  Resp 18  Ht 5\' 4"  (1.626 m)  Wt 174 lb 1.9 oz (78.98 kg)  BMI 29.87 kg/m2  SpO2 99%   Pleasant well nourished female, alert and oriented x 3, in no cardio-pulmonary distress. Afebrile. HEENT No facial trauma or asymetry. Sinuses non tender.  Extra occullar muscles intact, pupils equally reactive to light. External ears normal, tympanic membranes clear. Oropharynx moist, no exudate, fairly good  dentition. Neck: supple, no adenopathy,JVD or thyromegaly.No bruits.  Chest: Clear to ascultation bilaterally.No crackles or wheezes. Non tender to palpation  Breast: Right mastectomy,no masses or lumps. No tenderness. No nipple discharge or inversion. No axillary or supraclavicular adenopathy  Cardiovascular system; Heart sounds normal,  S1 and  S2 ,no S3.  No murmur, or thrill. Apical beat not displaced Peripheral pulses normal.  Abdomen: Soft, non tender, no organomegaly or masses. No bruits. Bowel sounds normal. No guarding, tenderness or rebound.  Rectal:  Normal sphincter tone. No mass.No rectal masses.  Guaiac negative stool.  GU: External genitalia normal female genitalia , female distribution of hair. No lesions. Urethral meatus normal in size, no  Prolapse, no lesions visibly  Present. Bladder non tender. Vagina pink and moist , with no visible lesions , discharge present . Adequate pelvic support no   cystocele or rectocele noted  Uterus absent, no adnexal masses, no  adnexal tenderness.   Musculoskeletal exam: Decreased  ROM of lumbar spine,adequate in  hips , shoulders and knees. No deformity ,swelling or crepitus noted. No muscle wasting or atrophy.   Neurologic: Cranial nerves 2 to 12 intact. Power, tone ,sensation and reflexes normal throughout. No disturbance in gait. No tremor.  Skin: Intact, no ulceration, erythema , scaling or rash noted. Pigmentation normal throughout  Psych; Normal mood and affect. Judgement and concentration normal         Assessment & Plan:  Annual physical exam Annual exam as documented. Counseling done  re healthy lifestyle involving commitment to 150 minutes exercise per week, heart healthy diet, and attaining healthy weight.The importance of adequate sleep also discussed. Regular seat belt use and home safety, is also discussed. Changes in health habits are decided on by the patient with goals and time frames  set for achieving them. Immunization and cancer screening needs are specifically addressed at this visit.   Lumbar pain with radiation down both legs Uncontrolled.Toradol and depo medrol administered IM in the office , to be followed by a short course of oral prednisone and NSAIDS.   Acute cystitis Symptomatic with abnormal urine , will f/u on c/s

## 2015-09-15 ENCOUNTER — Ambulatory Visit (INDEPENDENT_AMBULATORY_CARE_PROVIDER_SITE_OTHER): Payer: 59

## 2015-09-15 DIAGNOSIS — Z23 Encounter for immunization: Secondary | ICD-10-CM

## 2016-01-12 ENCOUNTER — Ambulatory Visit (INDEPENDENT_AMBULATORY_CARE_PROVIDER_SITE_OTHER): Payer: BLUE CROSS/BLUE SHIELD | Admitting: Family Medicine

## 2016-01-12 ENCOUNTER — Encounter: Payer: Self-pay | Admitting: Family Medicine

## 2016-01-12 VITALS — BP 126/82 | HR 90 | Resp 18 | Ht 64.0 in | Wt 174.0 lb

## 2016-01-12 DIAGNOSIS — I10 Essential (primary) hypertension: Secondary | ICD-10-CM | POA: Diagnosis not present

## 2016-01-12 DIAGNOSIS — E785 Hyperlipidemia, unspecified: Secondary | ICD-10-CM | POA: Diagnosis not present

## 2016-01-12 DIAGNOSIS — M545 Low back pain, unspecified: Secondary | ICD-10-CM

## 2016-01-12 DIAGNOSIS — M79605 Pain in left leg: Secondary | ICD-10-CM

## 2016-01-12 DIAGNOSIS — J3089 Other allergic rhinitis: Secondary | ICD-10-CM

## 2016-01-12 DIAGNOSIS — M79604 Pain in right leg: Secondary | ICD-10-CM

## 2016-01-12 DIAGNOSIS — Z1159 Encounter for screening for other viral diseases: Secondary | ICD-10-CM

## 2016-01-12 DIAGNOSIS — E559 Vitamin D deficiency, unspecified: Secondary | ICD-10-CM | POA: Diagnosis not present

## 2016-01-12 DIAGNOSIS — E663 Overweight: Secondary | ICD-10-CM

## 2016-01-12 NOTE — Assessment & Plan Note (Signed)
Unchanged Patient re-educated about  the importance of commitment to a  minimum of 150 minutes of exercise per week.  The importance of healthy food choices with portion control discussed. Encouraged to start a food diary, count calories and to consider  joining a support group. Sample diet sheets offered. Goals set by the patient for the next several months.   Weight /BMI 01/12/2016 08/11/2015 08/10/2015  WEIGHT 174 lb 174 lb 1.9 oz 184 lb  HEIGHT 5\' 4"  5\' 4"  5\' 4"   BMI 29.85 kg/m2 29.87 kg/m2 31.57 kg/m2    Current exercise per week 90 minutes.

## 2016-01-12 NOTE — Assessment & Plan Note (Signed)
Hyperlipidemia:Low fat diet discussed and encouraged.   Lipid Panel  Lab Results  Component Value Date   CHOL 187 09/09/2014   HDL 70 09/09/2014   LDLCALC 101* 09/09/2014   TRIG 82 09/09/2014   CHOLHDL 2.7 09/09/2014   Updated lab needed

## 2016-01-12 NOTE — Assessment & Plan Note (Signed)
Controlled, no change in medication DASH diet and commitment to daily physical activity for a minimum of 30 minutes discussed and encouraged, as a part of hypertension management. The importance of attaining a healthy weight is also discussed.  BP/Weight 01/12/2016 08/11/2015 08/10/2015 03/18/2015 11/11/2014 09/03/2014 99991111  Systolic BP 123XX123 Q000111Q A999333 123456 0000000 Q000111Q Q000111Q  Diastolic BP 82 76 94 84 82 84 78  Wt. (Lbs) 174 174.12 184 175 168.08 166 168  BMI 29.85 29.87 31.57 30.02 28.84 28.48 28.82

## 2016-01-12 NOTE — Patient Instructions (Signed)
CPE  August 21 or after , call if you need me sooner   Pls schedule mammogram in Feb, due in March  Start aspirin 81 mg one daily to reduce the risk of stroke  Please work on good  health habits so that your health will improve. 1. Commitment to daily physical activity for 30 to 60  minutes, if you are able to do this.  2. Commitment to wise food choices. Aim for half of your  food intake to be vegetable and fruit, one quarter starchy foods, and one quarter protein. Try to eat on a regular schedule  3 meals per day, snacking between meals should be limited to vegetables or fruits or small portions of nuts. 64 ounces of water per day is generally recommended, unless you have specific health conditions, like heart failure or kidney failure where you will need to limit fluid intake.  3. Commitment to sufficient and a  good quality of physical and mental rest daily, generally between 6 to 8 hours per day.  WITH PERSISTANCE AND PERSEVERANCE, THE IMPOSSIBLE , BECOMES THE NORM!  Weight loss goal of 8 pounds in 8 month   CBC, fasting lipid, chem 7, Hep C screen, vit D  Feb 24 or after  Thanks for choosing [facility], we consider it a privelige to serve you.

## 2016-01-12 NOTE — Assessment & Plan Note (Signed)
On chronic daily medication which is effective, most of the time, benefits from massage therapy also

## 2016-01-12 NOTE — Progress Notes (Signed)
Subjective:    Patient ID: Paula Randolph, female    DOB: 1961-12-19, 55 y.o.   MRN: [id number]  HPI   Paula Randolph     MRN: [id number]      DOB: 10/14/1961   HPI Paula Randolph is here for follow up and re-evaluation of chronic medical conditions, medication management and review of any available recent lab and radiology data.  Preventive health is updated, specifically  Cancer screening and Immunization.   Questions or concerns regarding consultations or procedures which the PT has had in the interim are  addressed. The PT denies any adverse reactions to current medications since the last visit.  There are no new concerns.  There are no specific complaints   ROS Denies recent fever or chills. Denies sinus pressure, nasal congestion, ear pain or sore throat. Denies chest congestion, productive cough or wheezing. Denies chest pains, palpitations and leg swelling Denies abdominal pain, nausea, vomiting,diarrhea or constipation.   Denies dysuria, frequency, hesitancy or incontinence. Denies uncontrolled  joint pain, swelling and limitation in mobility. Denies headaches, seizures, numbness, or tingling. Denies depression, anxiety or insomnia. Denies skin break down or rash.   PE  BP 126/82 mmHg  Pulse 90  Resp 18  Ht 5\' 4"  (1.626 m)  Wt 174 lb (78.926 kg)  BMI 29.85 kg/m2  SpO2 99%  Patient alert and oriented and in no cardiopulmonary distress.  HEENT: No facial asymmetry, EOMI,   oropharynx pink and moist.  Neck supple no JVD, no mass.  Chest: Clear to auscultation bilaterally.  CVS: S1, S2 no murmurs, no S3.Regular rate.  ABD: Soft non tender.   Ext: No edema  MS: Adequate ROM spine, shoulders, hips and knees.  Skin: Intact, no ulcerations or rash noted.  Psych: Good eye contact, normal affect. Memory intact not anxious or depressed appearing.  CNS: CN 2-12 intact, power,  normal throughout.no focal deficits noted.   Assessment & Plan   Essential  hypertension Controlled, no change in medication DASH diet and commitment to daily physical activity for a minimum of 30 minutes discussed and encouraged, as a part of hypertension management. The importance of attaining a healthy weight is also discussed.  BP/Weight 01/12/2016 08/11/2015 08/10/2015 03/18/2015 11/11/2014 09/03/2014 99991111  Systolic BP 123XX123 Q000111Q A999333 123456 0000000 Q000111Q Q000111Q  Diastolic BP 82 76 94 84 82 84 78  Wt. (Lbs) 174 174.12 184 175 168.08 166 168  BMI 29.85 29.87 31.57 30.02 28.84 28.48 28.82        Allergic rhinitis No current flare and no need for daily medication at this time  Overweight (BMI 25.0-29.9) Unchanged Patient re-educated about  the importance of commitment to a  minimum of 150 minutes of exercise per week.  The importance of healthy food choices with portion control discussed. Encouraged to start a food diary, count calories and to consider  joining a support group. Sample diet sheets offered. Goals set by the patient for the next several months.   Weight /BMI 01/12/2016 08/11/2015 08/10/2015  WEIGHT 174 lb 174 lb 1.9 oz 184 lb  HEIGHT 5\' 4"  5\' 4"  5\' 4"   BMI 29.85 kg/m2 29.87 kg/m2 31.57 kg/m2    Current exercise per week 90 minutes.   Lumbar pain with radiation down both legs On chronic daily medication which is effective, most of the time, benefits from massage therapy also  Dyslipidemia Hyperlipidemia:Low fat diet discussed and encouraged.   Lipid Panel  Lab Results  Component Value Date   CHOL 187 09/09/2014  HDL 70 09/09/2014   LDLCALC 101* 09/09/2014   TRIG 82 09/09/2014   CHOLHDL 2.7 09/09/2014   Updated lab needed           Review of Systems     Objective:   Physical Exam        Assessment & Plan:

## 2016-01-12 NOTE — Assessment & Plan Note (Signed)
No current flare and no need for daily medication at this time

## 2016-02-02 ENCOUNTER — Other Ambulatory Visit: Payer: Self-pay | Admitting: Family Medicine

## 2016-03-10 ENCOUNTER — Ambulatory Visit
Admission: RE | Admit: 2016-03-10 | Discharge: 2016-03-10 | Disposition: A | Discharge status: Home / Self Care | Payer: BLUE CROSS/BLUE SHIELD | Source: Ambulatory Visit | Attending: Oncology | Admitting: Oncology

## 2016-03-10 DIAGNOSIS — Z1231 Encounter for screening mammogram for malignant neoplasm of breast: Secondary | ICD-10-CM | POA: Insufficient documentation

## 2016-03-10 HISTORY — DX: Reserved for concepts with insufficient information to code with codable children: IMO0002

## 2016-03-10 HISTORY — DX: Malignant neoplasm of unspecified site of unspecified female breast: C50.919

## 2016-03-15 ENCOUNTER — Inpatient Hospital Stay: Payer: BLUE CROSS/BLUE SHIELD | Attending: Oncology | Admitting: Oncology

## 2016-03-15 VITALS — BP 142/91 | HR 69 | Temp 96.9°F | Resp 18 | Wt 175.3 lb

## 2016-03-15 DIAGNOSIS — E559 Vitamin D deficiency, unspecified: Secondary | ICD-10-CM | POA: Diagnosis not present

## 2016-03-15 DIAGNOSIS — Z853 Personal history of malignant neoplasm of breast: Secondary | ICD-10-CM | POA: Diagnosis not present

## 2016-03-15 DIAGNOSIS — D649 Anemia, unspecified: Secondary | ICD-10-CM

## 2016-03-15 DIAGNOSIS — E785 Hyperlipidemia, unspecified: Secondary | ICD-10-CM | POA: Diagnosis not present

## 2016-03-15 DIAGNOSIS — Z79899 Other long term (current) drug therapy: Secondary | ICD-10-CM | POA: Diagnosis not present

## 2016-03-15 DIAGNOSIS — I10 Essential (primary) hypertension: Secondary | ICD-10-CM | POA: Diagnosis not present

## 2016-03-15 DIAGNOSIS — Z170 Estrogen receptor positive status [ER+]: Secondary | ICD-10-CM | POA: Insufficient documentation

## 2016-03-15 DIAGNOSIS — C50911 Malignant neoplasm of unspecified site of right female breast: Secondary | ICD-10-CM

## 2016-03-15 NOTE — Progress Notes (Signed)
Patient here today for one year follow up.

## 2016-03-25 ENCOUNTER — Other Ambulatory Visit: Payer: Self-pay | Admitting: Family Medicine

## 2016-03-25 LAB — CBC
HEMATOCRIT: 35.6 % — AB (ref 36.0–46.0)
HEMOGLOBIN: 10.9 g/dL — AB (ref 12.0–15.0)
MCH: 25.6 pg — ABNORMAL LOW (ref 26.0–34.0)
MCHC: 30.6 g/dL (ref 30.0–36.0)
MCV: 83.8 fL (ref 78.0–100.0)
MPV: 10.4 fL (ref 8.6–12.4)
Platelets: 419 10*3/uL — ABNORMAL HIGH (ref 150–400)
RBC: 4.25 MIL/uL (ref 3.87–5.11)
RDW: 12.9 % (ref 11.5–15.5)
WBC: 7.3 10*3/uL (ref 4.0–10.5)

## 2016-03-26 LAB — VITAMIN D 25 HYDROXY (VIT D DEFICIENCY, FRACTURES): Vit D, 25-Hydroxy: 27 ng/mL — ABNORMAL LOW (ref 30–100)

## 2016-03-26 LAB — BASIC METABOLIC PANEL
BUN: 16 mg/dL (ref 7–25)
CALCIUM: 9.9 mg/dL (ref 8.6–10.4)
CO2: 25 mmol/L (ref 20–31)
CREATININE: 0.59 mg/dL (ref 0.50–1.05)
Chloride: 104 mmol/L (ref 98–110)
GLUCOSE: 90 mg/dL (ref 65–99)
Potassium: 3.9 mmol/L (ref 3.5–5.3)
SODIUM: 142 mmol/L (ref 135–146)

## 2016-03-26 LAB — LIPID PANEL
CHOL/HDL RATIO: 2.8 ratio (ref <=5.0)
CHOLESTEROL: 195 mg/dL (ref 125–200)
HDL: 69 mg/dL (ref >=46)
LDL Cholesterol: 115 mg/dL (ref <130)
Triglycerides: 56 mg/dL (ref <150)
VLDL: 11 mg/dL (ref <30)

## 2016-03-26 LAB — HEPATITIS C ANTIBODY: HCV AB: NEGATIVE

## 2016-03-26 LAB — FERRITIN: Ferritin: 108 ng/mL (ref 10–232)

## 2016-03-26 LAB — IRON: Iron: 73 ug/dL (ref 45–160)

## 2016-03-28 NOTE — Progress Notes (Signed)
[place]  [phone] [phone]  ID: Paula Randolph OB: 1961/07/09  MR#: [id number]  [id number]  Patient Care Team: Fayrene Helper, MD as PCP - General Lloyd Huger, MD as Consulting Physician (Oncology)  CHIEF COMPLAINT:  Chief Complaint  Patient presents with  . Breast Cancer    INTERVAL HISTORY: Patient returns to clinic today for routine yearly followup. She continues to feel well and remains asymptomatic. She denies any neurologic complaints. She has no bony pain. She denies any fevers, chills, night sweats or weight loss. She has a good appetite and denies any nausea, vomiting, constipation, or diarrhea. Patient states she feels at her baseline today and offers no specific complaints today.  REVIEW OF SYSTEMS:   Review of Systems  Constitutional: Negative.  Negative for fever, weight loss and malaise/fatigue.  Respiratory: Negative.  Negative for sputum production.   Cardiovascular: Negative.  Negative for chest pain.  Gastrointestinal: Negative.   Musculoskeletal: Negative.   Neurological: Negative.     As per HPI. Otherwise, a complete review of systems is negatve.  PAST MEDICAL HISTORY: Past Medical History  Diagnosis Date  . Hypertension   . Vitamin D deficiency   . Dyslipidemia   . Overweight(278.02)   . Allergy   . Primary osteoarthritis of both knees   . Right arm pain   . Trochanteric bursitis   . Strain of lumbar spine   . Cancer [facility])     right breast   . Breast cancer ([place]) 2008    RT LUMPECTOMY  . Radiation 2008    RT BREAST CA    PAST SURGICAL HISTORY: Past Surgical History  Procedure Laterality Date  . Right breast     . Mastectomy    . Appendectomy    . Abdominal hysterectomy      fibroids  . Breast surgery Right 2008    partial  . Colonoscopy N/A 05/10/2014    Procedure: COLONOSCOPY;  Surgeon: Daneil Dolin, MD;  Location: [facility];  Service: Endoscopy;  Laterality: N/A;   8:30 AM    FAMILY HISTORY Family History  Problem Relation Age of Onset  . Cancer Mother     breast   . Breast cancer Mother 80  . Diabetes Father   . Diabetes Brother   . Diabetes Sister        ADVANCED DIRECTIVES:    HEALTH MAINTENANCE: Social History  Substance Use Topics  . Smoking status: Never Smoker   . Smokeless tobacco: Not on file  . Alcohol Use: No     Colonoscopy:  PAP:  Bone density:  Lipid panel:  No Known Allergies  Current Outpatient Prescriptions  Medication Sig Dispense Refill  . ergocalciferol (VITAMIN D2) 50000 units capsule Take 50,000 Units by mouth once a week.    . Multiple Vitamin (MULTIVITAMIN WITH MINERALS) TABS tablet Take 1 tablet by mouth daily.    Marland Kitchen triamterene-hydrochlorothiazide (MAXZIDE-25) 37.5-25 MG tablet TAKE ONE TABLET BY MOUTH ONCE DAILY 30 tablet 2  . vitamin C (ASCORBIC ACID) 500 MG tablet Take 500 mg by mouth daily.     No current facility-administered medications for this visit.    OBJECTIVE: Filed Vitals:   03/15/16 1129  BP: 142/91  Pulse: 69  Temp: 96.9 F (36.1 C)  Resp: 18     Body mass index is 30.07 kg/(m^2).    ECOG FS:0 - Asymptomatic  General: Well-developed, well-nourished, no acute distress. Eyes: Pink conjunctiva, anicteric sclera. Breasts: Bilateral breast  and axilla without lumps or masses. Lungs: Clear to auscultation bilaterally. Heart: Regular rate and rhythm. No rubs, murmurs, or gallops. Abdomen: Soft, nontender, nondistended. No organomegaly noted, normoactive bowel sounds. Musculoskeletal: No edema, cyanosis, or clubbing. Neuro: Alert, answering all questions appropriately. Cranial nerves grossly intact. Skin: No rashes or petechiae noted. Psych: Normal affect.   LAB RESULTS:  Lab Results  Component Value Date   NA 142 03/25/2016   K 3.9 03/25/2016   CL 104 03/25/2016   CO2 25 03/25/2016   GLUCOSE 90 03/25/2016   BUN 16 03/25/2016   CREATININE 0.59 03/25/2016   CALCIUM 9.9  03/25/2016   PROT 6.5 06/05/2008   ALBUMIN 3.6 06/05/2008   AST 17 06/05/2008   ALT 10 06/05/2008   ALKPHOS 39 06/05/2008   BILITOT 0.6 06/05/2008   GFRNONAA >89 07/07/2015   GFRAA >89 07/07/2015    Lab Results  Component Value Date   WBC 7.3 03/25/2016   NEUTROABS 4.6 03/15/2014   HGB 10.9* 03/25/2016   HCT 35.6* 03/25/2016   MCV 83.8 03/25/2016   PLT 419* 03/25/2016     STUDIES: Mm Digital Screening Bilateral  03/10/2016  CLINICAL DATA:  Screening. EXAM: DIGITAL SCREENING BILATERAL MAMMOGRAM WITH CAD COMPARISON:  Previous exam(s). ACR Breast Density Category c: The breast tissue is heterogeneously dense, which may obscure small masses. FINDINGS: There are no findings suspicious for malignancy. Images were processed with CAD. IMPRESSION: No mammographic evidence of malignancy. A result letter of this screening mammogram will be mailed directly to the patient. RECOMMENDATION: Screening mammogram in one year. (Code:SM-B-01Y) BI-RADS CATEGORY  1: Negative. Electronically Signed   By: Lillia Mountain M.D.   On: 03/10/2016 11:14    ASSESSMENT: Right breast DCIS.  PLAN:    1. DCIS: No evidence of disease.  Patient has now completed 5 years of adjuvant hormonal therapy.  No further intervention is needed at this time.  Mammogram on March 10, 2016 reviewed independently and  was reported as BI-RADS 1. Repeat in one year. Discuss with patient possible discharged from clinic, but she would prefer extended follow-up at this time.  Return to clinic in one year for routine evaluation.   2.  Anemia:  Continue with Tandem supplementation.  Monitor. 3. Genetic testing:  Previously, BCRA testing revealed a negative BCRA 1 mutation, BCRA 2 sequencing did reveal a genetic variant but the significance of this is unknown.   Patient expressed understanding and was in agreement with this plan. She also understands that She can call clinic at any time with any questions, concerns, or complaints.     Lloyd Huger, MD   03/28/2016 9:38 AM

## 2016-04-02 ENCOUNTER — Other Ambulatory Visit: Payer: Self-pay

## 2016-04-02 MED ORDER — ERGOCALCIFEROL 1.25 MG (50000 UT) PO CAPS: 50000.0000 [IU] | ORAL_CAPSULE | ORAL | Status: DC

## 2016-05-11 ENCOUNTER — Other Ambulatory Visit: Payer: Self-pay | Admitting: Family Medicine

## 2016-05-11 ENCOUNTER — Telehealth: Payer: Self-pay | Admitting: Family Medicine

## 2016-05-11 NOTE — Telephone Encounter (Signed)
Med refilled.

## 2016-05-11 NOTE — Telephone Encounter (Signed)
Please refill medication triamterene-hydrochlorothiazide (MAXZIDE-25) 37.5-25 MG tablet to [facility]

## 2016-08-16 ENCOUNTER — Ambulatory Visit (INDEPENDENT_AMBULATORY_CARE_PROVIDER_SITE_OTHER): Payer: BLUE CROSS/BLUE SHIELD | Admitting: Family Medicine

## 2016-08-16 ENCOUNTER — Encounter: Payer: Self-pay | Admitting: Family Medicine

## 2016-08-16 ENCOUNTER — Other Ambulatory Visit (HOSPITAL_COMMUNITY)
Admission: RE | Admit: 2016-08-16 | Discharge: 2016-08-16 | Disposition: A | Discharge status: Home / Self Care | Payer: BLUE CROSS/BLUE SHIELD | Source: Ambulatory Visit | Attending: Family Medicine | Admitting: Family Medicine

## 2016-08-16 VITALS — BP 142/90 | HR 69 | Resp 166 | Ht 64.0 in | Wt 174.1 lb

## 2016-08-16 DIAGNOSIS — I10 Essential (primary) hypertension: Secondary | ICD-10-CM

## 2016-08-16 DIAGNOSIS — E559 Vitamin D deficiency, unspecified: Secondary | ICD-10-CM

## 2016-08-16 DIAGNOSIS — Z0000 Encounter for general adult medical examination without abnormal findings: Secondary | ICD-10-CM | POA: Diagnosis not present

## 2016-08-16 DIAGNOSIS — Z124 Encounter for screening for malignant neoplasm of cervix: Secondary | ICD-10-CM

## 2016-08-16 DIAGNOSIS — Z23 Encounter for immunization: Secondary | ICD-10-CM | POA: Diagnosis not present

## 2016-08-16 DIAGNOSIS — Z1211 Encounter for screening for malignant neoplasm of colon: Secondary | ICD-10-CM

## 2016-08-16 DIAGNOSIS — Z01419 Encounter for gynecological examination (general) (routine) without abnormal findings: Secondary | ICD-10-CM | POA: Insufficient documentation

## 2016-08-16 DIAGNOSIS — E785 Hyperlipidemia, unspecified: Secondary | ICD-10-CM

## 2016-08-16 LAB — POC HEMOCCULT BLD/STL (OFFICE/1-CARD/DIAGNOSTIC): Fecal Occult Blood, POC: NEGATIVE

## 2016-08-16 MED ORDER — TRIAMTERENE-HCTZ 37.5-25 MG PO TABS: ORAL_TABLET | ORAL | 1 refills | Status: DC

## 2016-08-16 NOTE — Addendum Note (Signed)
Addended by: Denman George B on: 08/16/2016 08:39 AM   Modules accepted: Orders

## 2016-08-16 NOTE — Progress Notes (Signed)
142 90 

## 2016-08-16 NOTE — Patient Instructions (Signed)
F/u in 2 month, call if you need me sooner  Flu vaccine today  INCREASE triamterene to ONE AND A HALF tablets every day, blood pressure remains too high  Fasting chem 7, lipid an vit D in 2 months, 5 days before appt pls   .Please work on good  health habits so that your health will improve. 1. Commitment to daily physical activity for 30 to 60  minutes, if you are able to do this.  2. Commitment to wise food choices. Aim for half of your  food intake to be vegetable and fruit, one quarter starchy foods, and one quarter protein. Try to eat on a regular schedule  3 meals per day, snacking between meals should be limited to vegetables or fruits or small portions of nuts. 64 ounces of water per day is generally recommended, unless you have specific health conditions, like heart failure or kidney failure where you will need to limit fluid intake.  3. Commitment to sufficient and a  good quality of physical and mental rest daily, generally between 6 to 8 hours per day.  WITH PERSISTANCE AND PERSEVERANCE, THE IMPOSSIBLE , BECOMES THE NORM!  Thank you  for choosing  [facility]. We consider it a privelige to serve you.  Delivering excellent health care in a caring and  compassionate way is our goal.  Partnering with you,  so that together we can achieve this goal is our strategy.

## 2016-08-16 NOTE — Progress Notes (Signed)
Paula Randolph     MRN: [id number]      DOB: 1961/05/20  HPI: Patient is in for annual physical exam. No other health concerns are expressed or addressed at the visit. Recent labs, if available are reviewed. Immunization is reviewed , and  updated if needed.   PE: Pleasant  female, alert and oriented x 3, in no cardio-pulmonary distress. Afebrile. HEENT No facial trauma or asymetry. Sinuses non tender.  Extra occullar muscles intact, pupils equally reactive to light. External ears normal, tympanic membranes clear. Oropharynx moist, no exudate. Neck: supple, no adenopathy,JVD or thyromegaly.No bruits.  Chest: Clear to ascultation bilaterally.No crackles or wheezes. Non tender to palpation  Breast: No asymetry,no masses or lumps. No tenderness. No nipple discharge or inversion. No axillary or supraclavicular adenopathy  Cardiovascular system; Heart sounds normal,  S1 and  S2 ,no S3.  No murmur, or thrill. Apical beat not displaced Peripheral pulses normal.  Abdomen: Soft, non tender, no organomegaly or masses. No bruits. Bowel sounds normal. No guarding, tenderness or rebound.  Rectal:  Normal sphincter tone. No rectal mass. Guaiac negative stool.  GU: External genitalia normal female genitalia , normal female distribution of hair. No lesions. Urethral meatus normal in size, , no lesions visibly  Present.Poor pelvic support.Bladder non tender. Vagina pink and moist , with no visible lesions ,no discharge present . Adequate pelvic support no  cystocele or rectocele noted  Uterus absent, no adnexal masses, no  adnexal tenderness.   Musculoskeletal exam: Full ROM of spine, hips , shoulders and knees. No deformity ,swelling or crepitus noted. No muscle wasting or atrophy.   Neurologic: Cranial nerves 2 to 12 intact. Power, tone ,sensation and reflexes normal throughout. No disturbance in gait. No tremor.  Skin: Intact, no ulceration, erythema , scaling or  rash noted. Pigmentation normal throughout  Psych; Normal mood and affect. Judgement and concentration normal   Assessment & Plan:  Annual physical exam Annual exam as documented. Counseling done  re healthy lifestyle involving commitment to 150 minutes exercise per week, heart healthy diet, and attaining healthy weight.The importance of adequate sleep also discussed. Regular seat belt use and home safety, is also discussed. Changes in health habits are decided on by the patient with goals and time frames  set for achieving them. Immunization and cancer screening needs are specifically addressed at this visit.   Overweight (BMI 25.0-29.9) Deteriorated. Patient re-educated about  the importance of commitment to a  minimum of 150 minutes of exercise per week.  The importance of healthy food choices with portion control discussed. Encouraged to start a food diary, count calories and to consider  joining a support group. Sample diet sheets offered. Goals set by the patient for the next several months.   Weight /BMI 08/16/2016 03/15/2016 01/12/2016  WEIGHT 174 lb 1.9 oz 175 lb 4.3 oz 174 lb  HEIGHT 5\' 4"  - 5\' 4"   BMI 29.89 kg/m2 30.07 kg/m2 29.85 kg/m2      Essential hypertension Uncontrolled Increase dose of maxzide, f/u in  2 month DASH diet and commitment to daily physical activity for a minimum of 30 minutes discussed and encouraged, as a part of hypertension management. The importance of attaining a healthy weight is also discussed.  BP/Weight 08/16/2016 03/15/2016 01/12/2016 08/11/2015 08/10/2015 03/18/2015 A999333  Systolic BP A999333 A999333 123XX123 Q000111Q A999333 123456 0000000  Diastolic BP 90 91 82 76 94 84 82  Wt. (Lbs) 174.12 175.27 174 174.12 184 175 168.08  BMI 29.89 30.07 29.85  29.87 31.57 30.02 28.84       Need for prophylactic vaccination and inoculation against influenza After obtaining informed consent, the vaccine is  administered by LPN.

## 2016-08-16 NOTE — Assessment & Plan Note (Signed)

## 2016-08-16 NOTE — Assessment & Plan Note (Signed)
Deteriorated. Patient re-educated about  the importance of commitment to a  minimum of 150 minutes of exercise per week.  The importance of healthy food choices with portion control discussed. Encouraged to start a food diary, count calories and to consider  joining a support group. Sample diet sheets offered. Goals set by the patient for the next several months.   Weight /BMI 08/16/2016 03/15/2016 01/12/2016  WEIGHT 174 lb 1.9 oz 175 lb 4.3 oz 174 lb  HEIGHT 5\' 4"  - 5\' 4"   BMI 29.89 kg/m2 30.07 kg/m2 29.85 kg/m2

## 2016-08-16 NOTE — Assessment & Plan Note (Signed)
Uncontrolled Increase dose of maxzide, f/u in  2 month DASH diet and commitment to daily physical activity for a minimum of 30 minutes discussed and encouraged, as a part of hypertension management. The importance of attaining a healthy weight is also discussed.  BP/Weight 08/16/2016 03/15/2016 01/12/2016 08/11/2015 08/10/2015 03/18/2015 A999333  Systolic BP A999333 A999333 123XX123 Q000111Q A999333 123456 0000000  Diastolic BP 90 91 82 76 94 84 82  Wt. (Lbs) 174.12 175.27 174 174.12 184 175 168.08  BMI 29.89 30.07 29.85 29.87 31.57 30.02 28.84

## 2016-08-16 NOTE — Addendum Note (Signed)
Addended by: Eual Fines on: 08/16/2016 08:53 AM   Modules accepted: Orders

## 2016-08-16 NOTE — Assessment & Plan Note (Signed)
After obtaining informed consent, the vaccine is  administered by LPN.  

## 2016-08-16 NOTE — Addendum Note (Signed)
Addended by: Eual Fines on: 08/16/2016 09:46 AM   Modules accepted: Orders

## 2016-08-17 LAB — CYTOLOGY - PAP

## 2016-10-26 ENCOUNTER — Ambulatory Visit: Payer: BLUE CROSS/BLUE SHIELD | Admitting: Family Medicine

## 2016-12-06 DIAGNOSIS — E559 Vitamin D deficiency, unspecified: Secondary | ICD-10-CM | POA: Diagnosis not present

## 2016-12-06 DIAGNOSIS — I10 Essential (primary) hypertension: Secondary | ICD-10-CM | POA: Diagnosis not present

## 2016-12-06 DIAGNOSIS — E785 Hyperlipidemia, unspecified: Secondary | ICD-10-CM | POA: Diagnosis not present

## 2016-12-07 ENCOUNTER — Ambulatory Visit (INDEPENDENT_AMBULATORY_CARE_PROVIDER_SITE_OTHER): Payer: BLUE CROSS/BLUE SHIELD | Admitting: Family Medicine

## 2016-12-07 ENCOUNTER — Encounter: Payer: Self-pay | Admitting: Family Medicine

## 2016-12-07 VITALS — BP 130/90 | HR 87 | Temp 98.6°F | Resp 16 | Ht 64.0 in | Wt 169.0 lb

## 2016-12-07 DIAGNOSIS — J209 Acute bronchitis, unspecified: Secondary | ICD-10-CM | POA: Diagnosis not present

## 2016-12-07 DIAGNOSIS — M5432 Sciatica, left side: Secondary | ICD-10-CM | POA: Diagnosis not present

## 2016-12-07 DIAGNOSIS — E559 Vitamin D deficiency, unspecified: Secondary | ICD-10-CM | POA: Diagnosis not present

## 2016-12-07 DIAGNOSIS — I10 Essential (primary) hypertension: Secondary | ICD-10-CM

## 2016-12-07 LAB — LIPID PANEL
CHOL/HDL RATIO: 2.7 ratio (ref <5.0)
Cholesterol: 185 mg/dL (ref <200)
HDL: 68 mg/dL (ref >50)
LDL CALC: 104 mg/dL — AB (ref <100)
TRIGLYCERIDES: 67 mg/dL (ref <150)
VLDL: 13 mg/dL (ref <30)

## 2016-12-07 LAB — BASIC METABOLIC PANEL
BUN: 12 mg/dL (ref 7–25)
CHLORIDE: 106 mmol/L (ref 98–110)
CO2: 28 mmol/L (ref 20–31)
CREATININE: 0.63 mg/dL (ref 0.50–1.05)
Calcium: 9.5 mg/dL (ref 8.6–10.4)
Glucose, Bld: 96 mg/dL (ref 65–99)
POTASSIUM: 3.7 mmol/L (ref 3.5–5.3)
SODIUM: 142 mmol/L (ref 135–146)

## 2016-12-07 LAB — VITAMIN D 25 HYDROXY (VIT D DEFICIENCY, FRACTURES): VIT D 25 HYDROXY: 24 ng/mL — AB (ref 30–100)

## 2016-12-07 MED ORDER — PREDNISONE 5 MG (21) PO TBPK: 5.0000 mg | ORAL_TABLET | ORAL | 0 refills | Status: DC

## 2016-12-07 MED ORDER — PROMETHAZINE-DM 6.25-15 MG/5ML PO SYRP: ORAL_SOLUTION | ORAL | 0 refills | Status: DC

## 2016-12-07 MED ORDER — PENICILLIN V POTASSIUM 500 MG PO TABS: 500.0000 mg | ORAL_TABLET | Freq: Three times a day (TID) | ORAL | 0 refills | Status: DC

## 2016-12-07 MED ORDER — BENZONATATE 100 MG PO CAPS: 100.0000 mg | ORAL_CAPSULE | Freq: Two times a day (BID) | ORAL | 0 refills | Status: DC | PRN

## 2016-12-07 MED ORDER — GABAPENTIN 100 MG PO CAPS: 100.0000 mg | ORAL_CAPSULE | Freq: Every day | ORAL | 3 refills | Status: DC

## 2016-12-07 MED ORDER — METHYLPREDNISOLONE ACETATE 80 MG/ML IJ SUSP
80.0000 mg | Freq: Once | INTRAMUSCULAR | Status: AC
  Administered 2016-12-07: 80 mg via INTRAMUSCULAR

## 2016-12-07 MED ORDER — KETOROLAC TROMETHAMINE 60 MG/2ML IM SOLN
60.0000 mg | Freq: Once | INTRAMUSCULAR | Status: AC
  Administered 2016-12-07: 60 mg via INTRAMUSCULAR

## 2016-12-07 MED ORDER — CEFTRIAXONE SODIUM 500 MG IJ SOLR
500.0000 mg | Freq: Once | INTRAMUSCULAR | Status: AC
  Administered 2016-12-07: 500 mg via INTRAMUSCULAR

## 2016-12-07 MED ORDER — ERGOCALCIFEROL 1.25 MG (50000 UT) PO CAPS: 50000.0000 [IU] | ORAL_CAPSULE | ORAL | 1 refills | Status: DC

## 2016-12-07 MED ORDER — IBUPROFEN 800 MG PO TABS: 800.0000 mg | ORAL_TABLET | Freq: Three times a day (TID) | ORAL | 0 refills | Status: DC | PRN

## 2016-12-07 MED ORDER — RANITIDINE HCL 300 MG PO TABS: 300.0000 mg | ORAL_TABLET | Freq: Every day | ORAL | 0 refills | Status: DC

## 2016-12-07 MED ORDER — TRIAMTERENE-HCTZ 75-50 MG PO TABS: 1.0000 | ORAL_TABLET | Freq: Every day | ORAL | 3 refills | Status: DC

## 2016-12-07 NOTE — Progress Notes (Signed)
   Paula Randolph     MRN: [id number]      DOB: 06-11-1961   HPI 1 week h/o worsening head and chest congestion, associated with fever and chills intermittently. Nasal drainage has thickened , and is yellowish green, and at times bloody. Sputum is thick and yellow. C/o bilateral ear pressure, denies hearing loss and sore throat. Increasing fatigue , poor appetitie and sleep disturbed by cough. No improvement with OTC medication. Weak and body aches. Back and LLE pain x 1 week to knee ROS Denies chest pains, palpitations and leg swelling Denies abdominal pain, nausea, vomiting,diarrhea or constipation.   Denies dysuria, frequency, hesitancy or incontinence. Denies headaches, seizures, numbness, or tingling. Denies depression, anxiety or insomnia. Denies skin break down or rash.   PE  BP 130/90   Pulse 87   Temp 98.6 F (37 C) (Oral)   Resp 16   Ht 5\' 4"  (1.626 m)   Wt 169 lb (76.7 kg)   SpO2 99%   BMI 29.01 kg/m  Ill appearing Patient alert and oriented and in no cardiopulmonary distress.  HEENT: No facial asymmetry, EOMI,   oropharynx pink and moist.  Neck supple no JVD, no mass.  Chest: decreased though adequate air entry, no wheezes, scattered crackles  CVS: S1, S2 no murmurs, no S3.Regular rate.  ABD: Soft non tender.   Ext: No edema  MS: decreased ROM lumbar spine, and tender over left SI joint  Skin: Intact, no ulcerations or rash noted.  Psych: Good eye contact, normal affect. Memory intact not anxious or depressed appearing.  CNS: CN 2-12 intact, power,  normal throughout.no focal deficits noted.   Assessment & Plan  Sciatica of left side Uncontrolled.Toradol and depo medrol administered IM in the office , to be followed by a short course of oral prednisone and NSAIDS.   Vitamin D deficiency Untreated needs to take weekly or daily supplement  Acute bronchitis 1 week h/o chest congestion, with green sputum, worsening, Rocephin in office , work  excuse , pen V at home  Essential hypertension Uncontrolled, dose increase in naxzide, f/u in 6 weeks DASH diet and commitment to daily physical activity for a minimum of 30 minutes discussed and encouraged, as a part of hypertension management. The importance of attaining a healthy weight is also discussed.  BP/Weight 12/07/2016 08/16/2016 03/15/2016 01/12/2016 08/11/2015 08/10/2015 0000000  Systolic BP [id number] A999333 [id number] [id number] A999333 123456  Diastolic BP 90 90 91 82 76 94 84  Wt. (Lbs) 169 174.12 175.27 174 174.12 184 175  BMI 29.01 29.89 30.07 29.85 29.87 31.57 30.02

## 2016-12-07 NOTE — Assessment & Plan Note (Signed)
Untreated needs to take weekly or daily supplement

## 2016-12-07 NOTE — Assessment & Plan Note (Signed)
Uncontrolled.Toradol and depo medrol administered IM in the office , to be followed by a short course of oral prednisone and NSAIDS.  

## 2016-12-07 NOTE — Assessment & Plan Note (Signed)
1 week h/o chest congestion, with green sputum, worsening, Rocephin in office , work excuse , pen V at home

## 2016-12-07 NOTE — Patient Instructions (Addendum)
F/u in 6 weeks, call if you need me before  You are treated for acute bronchitis, penicillin, tessalon perles and decongestants are prescribed, and Rocephin gioven in the office  You are treated for left sciatica, ibuprofen prednisone and are administered , low dose gabapentin at night for pain  BP is high, take TWO maxzide 25 mg daily tilll done , new dose is ONE maxzide 50 mg tablet daily  Work excuse from today return on 12/10/2016.

## 2016-12-07 NOTE — Assessment & Plan Note (Signed)
Uncontrolled, dose increase in naxzide, f/u in 6 weeks DASH diet and commitment to daily physical activity for a minimum of 30 minutes discussed and encouraged, as a part of hypertension management. The importance of attaining a healthy weight is also discussed.  BP/Weight 12/07/2016 08/16/2016 03/15/2016 01/12/2016 08/11/2015 08/10/2015 0000000  Systolic BP [id number] A999333 123XX123 Q000111Q A999333 123456  Diastolic BP 90 90 91 82 76 94 84  Wt. (Lbs) 169 174.12 175.27 174 174.12 184 175  BMI 29.01 29.89 30.07 29.85 29.87 31.57 30.02

## 2017-01-20 ENCOUNTER — Encounter: Payer: Self-pay | Admitting: Family Medicine

## 2017-01-20 ENCOUNTER — Ambulatory Visit (INDEPENDENT_AMBULATORY_CARE_PROVIDER_SITE_OTHER): Payer: BLUE CROSS/BLUE SHIELD | Admitting: Family Medicine

## 2017-01-20 VITALS — BP 158/90 | HR 77 | Resp 16 | Ht 64.0 in | Wt 161.1 lb

## 2017-01-20 DIAGNOSIS — M5432 Sciatica, left side: Secondary | ICD-10-CM

## 2017-01-20 DIAGNOSIS — R7301 Impaired fasting glucose: Secondary | ICD-10-CM

## 2017-01-20 DIAGNOSIS — I10 Essential (primary) hypertension: Secondary | ICD-10-CM | POA: Diagnosis not present

## 2017-01-20 DIAGNOSIS — R634 Abnormal weight loss: Secondary | ICD-10-CM

## 2017-01-20 LAB — CBC
HEMATOCRIT: 39.3 % (ref 35.0–45.0)
Hemoglobin: 12.1 g/dL (ref 11.7–15.5)
MCH: 26 pg — AB (ref 27.0–33.0)
MCHC: 30.8 g/dL — ABNORMAL LOW (ref 32.0–36.0)
MCV: 84.5 fL (ref 80.0–100.0)
MPV: 10.3 fL (ref 7.5–12.5)
PLATELETS: 424 10*3/uL — AB (ref 140–400)
RBC: 4.65 MIL/uL (ref 3.80–5.10)
RDW: 13 % (ref 11.0–15.0)
WBC: 7.1 10*3/uL (ref 3.8–10.8)

## 2017-01-20 LAB — BASIC METABOLIC PANEL
BUN: 12 mg/dL (ref 7–25)
CALCIUM: 10 mg/dL (ref 8.6–10.4)
CHLORIDE: 101 mmol/L (ref 98–110)
CO2: 28 mmol/L (ref 20–31)
Creat: 0.56 mg/dL (ref 0.50–1.05)
GLUCOSE: 89 mg/dL (ref 65–99)
Potassium: 3.5 mmol/L (ref 3.5–5.3)
Sodium: 139 mmol/L (ref 135–146)

## 2017-01-20 LAB — TSH: TSH: 0.82 mIU/L

## 2017-01-20 MED ORDER — AMLODIPINE BESYLATE 2.5 MG PO TABS: 2.5000 mg | ORAL_TABLET | Freq: Every day | ORAL | 2 refills | Status: DC

## 2017-01-20 MED ORDER — TRIAMTERENE-HCTZ 75-50 MG PO TABS: 1.0000 | ORAL_TABLET | Freq: Every day | ORAL | 2 refills | Status: DC

## 2017-01-20 NOTE — Assessment & Plan Note (Signed)
Reports good appetite and feels well, however dramatic weight loss Screen for endocrine disorder Cancer screening tests are all up to date

## 2017-01-20 NOTE — Progress Notes (Signed)
   Paula Randolph     MRN: [id number]      DOB: 09/03/1961   HPI Paula Randolph is here for follow up and re-evaluation of chronic medical conditions, medication management and review of any available recent lab and radiology data.  Preventive health is updated, specifically  Cancer screening and Immunization.   Questions or concerns regarding consultations or procedures which the PT has had in the interim are  addressed. The PT denies any adverse reactions to current medications since the last visit.  There are no new concerns.  There are no specific complaints   ROS Denies recent fever or chills. Denies sinus pressure, nasal congestion, ear pain or sore throat. Denies chest congestion, productive cough or wheezing. Denies chest pains, palpitations and leg swelling Denies abdominal pain, nausea, vomiting,diarrhea or constipation.   Denies dysuria, frequency, hesitancy or incontinence. Denies joint pain, swelling and limitation in mobility. Denies headaches, seizures, numbness, or tingling. Denies depression, anxiety or insomnia. Denies skin break down or rash.   PE  BP (!) 158/90   Pulse 77   Resp 16   Ht 5\' 4"  (1.626 m)   Wt 161 lb 1.9 oz (73.1 kg)   SpO2 100%   BMI 27.66 kg/m   Patient alert and oriented and in no cardiopulmonary distress.  HEENT: No facial asymmetry, EOMI,   oropharynx pink and moist.  Neck supple no JVD, no mass.  Chest: Clear to auscultation bilaterally.  CVS: S1, S2 no murmurs, no S3.Regular rate.  ABD: Soft non tender.   Ext: No edema  MS: Adequate ROM spine, shoulders, hips and knees.  Skin: Intact, no ulcerations or rash noted.  Psych: Good eye contact, normal affect. Memory intact not anxious or depressed appearing.  CNS: CN 2-12 intact, power,  normal throughout.no focal deficits noted.   Assessment & Plan  Essential hypertension Uncontrolled , add amlodipine DASH diet and commitment to daily physical activity for a minimum of 30  minutes discussed and encouraged, as a part of hypertension management. The importance of attaining a healthy weight is also discussed.  BP/Weight 01/20/2017 12/07/2016 08/16/2016 03/15/2016 01/12/2016 08/11/2015 [id number]  Systolic BP 0000000 [id number] A999333 A999333 [id number] Q000111Q A999333  Diastolic BP 90 90 90 91 82 76 94  Wt. (Lbs) 161.12 169 174.12 175.27 174 174.12 184  BMI 27.66 29.01 29.89 30.07 29.85 29.87 31.57       Sciatica of left side Resolved no need for gabapentin  Excessive weight loss Reports good appetite and feels well, however dramatic weight loss Screen for endocrine disorder Cancer screening tests are all up to date

## 2017-01-20 NOTE — Assessment & Plan Note (Signed)
Uncontrolled , add amlodipine DASH diet and commitment to daily physical activity for a minimum of 30 minutes discussed and encouraged, as a part of hypertension management. The importance of attaining a healthy weight is also discussed.  BP/Weight 01/20/2017 12/07/2016 08/16/2016 03/15/2016 01/12/2016 08/11/2015 [id number]  Systolic BP 0000000 [id number] A999333 A999333 123XX123 Q000111Q A999333  Diastolic BP 90 90 90 91 82 76 94  Wt. (Lbs) 161.12 169 174.12 175.27 174 174.12 184  BMI 27.66 29.01 29.89 30.07 29.85 29.87 31.57

## 2017-01-20 NOTE — Assessment & Plan Note (Addendum)
Resolved no need for gabapentin

## 2017-01-20 NOTE — Patient Instructions (Signed)
F/u in 2.5 month, call if you need me sooner.  BP still high, please take in NEW printed script for higher dose of triamterene Start an additional medication amlodipine 2.5 mg one daily, for BP this is also sent to your pharmacy  Labs today, cbc, chem 7, TSH and HBA1C  It is important that you exercise regularly at least 30 minutes 5 times a week. If you develop chest pain, have severe difficulty breathing, or feel very tired, stop exercising immediately and seek medical attention

## 2017-01-21 LAB — HEMOGLOBIN A1C
Hgb A1c MFr Bld: 4.7 % (ref <5.7)
MEAN PLASMA GLUCOSE: 88 mg/dL

## 2017-03-11 ENCOUNTER — Ambulatory Visit
Admission: RE | Admit: 2017-03-11 | Discharge: 2017-03-11 | Disposition: A | Discharge status: Home / Self Care | Payer: BLUE CROSS/BLUE SHIELD | Source: Ambulatory Visit | Attending: Oncology | Admitting: Oncology

## 2017-03-11 DIAGNOSIS — C50911 Malignant neoplasm of unspecified site of right female breast: Secondary | ICD-10-CM

## 2017-03-11 DIAGNOSIS — Z1231 Encounter for screening mammogram for malignant neoplasm of breast: Secondary | ICD-10-CM | POA: Insufficient documentation

## 2017-03-13 NOTE — Progress Notes (Signed)
[place]  Telephone:([phone]) [phone] Fax:(336) [phone]  ID: Paula Randolph OB: [birth year]  MR#: [id number]  WIO#:[id number]  Patient Care Team: Fayrene Helper, MD as PCP - General Lloyd Huger, MD as Consulting Physician (Oncology)  CHIEF COMPLAINT: Right breast DCIS.  INTERVAL HISTORY: Patient returns to clinic today for routine yearly followup. She continues to feel well and remains asymptomatic. She denies any neurologic complaints. She has no bony pain. She denies any fevers, chills, night sweats or weight loss. She denies any chest pain or shortness of breath. She has a good appetite and denies any nausea, vomiting, constipation, or diarrhea. She has no urinary complaint. Patient states she feels at her baseline today and offers no specific complaints today.  REVIEW OF SYSTEMS:   Review of Systems  Constitutional: Negative.  Negative for fever, malaise/fatigue and weight loss.  Respiratory: Negative.  Negative for cough and sputum production.   Cardiovascular: Negative.  Negative for chest pain and leg swelling.  Gastrointestinal: Negative.  Negative for abdominal pain.  Genitourinary: Negative.   Musculoskeletal: Negative.   Neurological: Negative.  Negative for sensory change and weakness.  Psychiatric/Behavioral: Negative.  The patient is not nervous/anxious.     As per HPI. Otherwise, a complete review of systems is negative.  PAST MEDICAL HISTORY: Past Medical History:  Diagnosis Date  . Allergy   . Breast cancer ([place]) 2008   RT LUMPECTOMY  . Cancer ([place])    right breast   . Dyslipidemia   . Hypertension   . Overweight(278.02)   . Primary osteoarthritis of both knees   . Radiation 2008   RT BREAST CA  . Right arm pain   . Strain of lumbar spine   . Trochanteric bursitis   . Vitamin D deficiency     PAST SURGICAL HISTORY: Past Surgical History:  Procedure Laterality Date  . ABDOMINAL HYSTERECTOMY     fibroids  . APPENDECTOMY     . BREAST SURGERY Right 2008   partial  . COLONOSCOPY N/A 05/10/2014   Procedure: COLONOSCOPY;  Surgeon: Daneil Dolin, MD;  Location: [facility];  Service: Endoscopy;  Laterality: N/A;  8:30 AM  . MASTECTOMY    . right breast       FAMILY HISTORY Family History  Problem Relation Age of Onset  . Cancer Mother     breast   . Breast cancer Mother 40  . Diabetes Father   . Diabetes Brother   . Diabetes Sister        ADVANCED DIRECTIVES:    HEALTH MAINTENANCE: Social History  Substance Use Topics  . Smoking status: Never Smoker  . Smokeless tobacco: Never Used  . Alcohol use No     Colonoscopy:  PAP:  Bone density:  Lipid panel:  No Known Allergies  Current Outpatient Prescriptions  Medication Sig Dispense Refill  . amLODipine (NORVASC) 2.5 MG tablet Take 1 tablet (2.5 mg total) by mouth daily. 30 tablet 2  . aspirin EC 81 MG tablet Take 81 mg by mouth daily.    . calcium gluconate 500 MG tablet Take 1 tablet by mouth daily.    . cholecalciferol (VITAMIN D) 1000 units tablet Take 1,000 Units by mouth daily.    Marland Kitchen gabapentin (NEURONTIN) 100 MG capsule Take 1 capsule (100 mg total) by mouth at bedtime. 30 capsule 3  . triamterene-hydrochlorothiazide (MAXZIDE) 75-50 MG tablet Take 1 tablet by mouth daily. 30 tablet 3   No current facility-administered medications for this visit.  OBJECTIVE: Vitals:   03/15/17 1133  BP: (!) 146/92  Pulse: 69  Resp: 18  Temp: 97.1 F (36.2 C)     Body mass index is 27.1 kg/m.    ECOG FS:0 - Asymptomatic  General: Well-developed, well-nourished, no acute distress. Eyes: Pink conjunctiva, anicteric sclera. Breasts: Bilateral breast and axilla without lumps or masses. Lungs: Clear to auscultation bilaterally. Heart: Regular rate and rhythm. No rubs, murmurs, or gallops. Abdomen: Soft, nontender, nondistended. No organomegaly noted, normoactive bowel sounds. Musculoskeletal: No edema, cyanosis, or clubbing. Neuro: Alert,  answering all questions appropriately. Cranial nerves grossly intact. Skin: No rashes or petechiae noted. Psych: Normal affect.   LAB RESULTS:  Lab Results  Component Value Date   NA 139 01/20/2017   K 3.5 01/20/2017   CL 101 01/20/2017   CO2 28 01/20/2017   GLUCOSE 89 01/20/2017   BUN 12 01/20/2017   CREATININE 0.56 01/20/2017   CALCIUM 10.0 01/20/2017   PROT 6.5 06/05/2008   ALBUMIN 3.6 06/05/2008   AST 17 06/05/2008   ALT 10 06/05/2008   ALKPHOS 39 06/05/2008   BILITOT 0.6 06/05/2008   GFRNONAA >89 07/07/2015   GFRAA >89 07/07/2015    Lab Results  Component Value Date   WBC 7.1 01/20/2017   NEUTROABS 4.6 03/15/2014   HGB 12.1 01/20/2017   HCT 39.3 01/20/2017   MCV 84.5 01/20/2017   PLT 424 (H) 01/20/2017     STUDIES: Mm Digital Screening Bilateral  Result Date: 03/11/2017 CLINICAL DATA:  Screening. EXAM: DIGITAL SCREENING BILATERAL MAMMOGRAM WITH CAD COMPARISON:  Previous exam(s). ACR Breast Density Category c: The breast tissue is heterogeneously dense, which may obscure small masses. FINDINGS: There are no findings suspicious for malignancy. Images were processed with CAD. IMPRESSION: No mammographic evidence of malignancy. A result letter of this screening mammogram will be mailed directly to the patient. RECOMMENDATION: Screening mammogram in one year. (Code:SM-B-01Y) BI-RADS CATEGORY  1: Negative. Electronically Signed   By: Ammie Ferrier M.D.   On: 03/11/2017 10:45    ASSESSMENT: Right breast DCIS.  PLAN:    1. Right breast DCIS: No evidence of disease.  Patient has now completed 5 years of adjuvant hormonal therapy.  No further intervention is needed at this time.  Mammogram on March 11, 2017 reviewed independently and was reported as BI-RADS 1. Repeat in March 2018. Discussed again with patient possible discharged from clinic, but she would prefer extended follow-up at this time.  Return to clinic in one year for routine evaluation, although patient  agreed this will likely be her last appointment.   2.  Anemia:  Continue with Tandem supplementation.  Monitor. 3. Genetic testing:  Previously, BCRA testing revealed a negative BCRA 1 mutation, BCRA 2 sequencing did reveal a genetic variant but the significance of this is unknown.   Patient expressed understanding and was in agreement with this plan. She also understands that She can call clinic at any time with any questions, concerns, or complaints.    Lloyd Huger, MD   03/20/2017 9:52 AM

## 2017-03-15 ENCOUNTER — Inpatient Hospital Stay: Payer: BLUE CROSS/BLUE SHIELD | Attending: Oncology | Admitting: Oncology

## 2017-03-15 ENCOUNTER — Encounter: Payer: Self-pay | Admitting: Oncology

## 2017-03-15 VITALS — BP 146/92 | HR 69 | Temp 97.1°F | Resp 18 | Ht 64.0 in | Wt 157.9 lb

## 2017-03-15 DIAGNOSIS — D649 Anemia, unspecified: Secondary | ICD-10-CM | POA: Diagnosis not present

## 2017-03-15 DIAGNOSIS — D0511 Intraductal carcinoma in situ of right breast: Secondary | ICD-10-CM

## 2017-03-15 DIAGNOSIS — Z170 Estrogen receptor positive status [ER+]: Secondary | ICD-10-CM | POA: Diagnosis not present

## 2017-03-15 DIAGNOSIS — Z9011 Acquired absence of right breast and nipple: Secondary | ICD-10-CM

## 2017-03-15 DIAGNOSIS — Z9223 Personal history of estrogen therapy: Secondary | ICD-10-CM

## 2017-03-15 DIAGNOSIS — Z853 Personal history of malignant neoplasm of breast: Secondary | ICD-10-CM | POA: Diagnosis not present

## 2017-03-15 DIAGNOSIS — E559 Vitamin D deficiency, unspecified: Secondary | ICD-10-CM | POA: Diagnosis not present

## 2017-03-15 DIAGNOSIS — I10 Essential (primary) hypertension: Secondary | ICD-10-CM | POA: Diagnosis not present

## 2017-03-15 DIAGNOSIS — Z7982 Long term (current) use of aspirin: Secondary | ICD-10-CM | POA: Diagnosis not present

## 2017-03-15 DIAGNOSIS — E785 Hyperlipidemia, unspecified: Secondary | ICD-10-CM | POA: Insufficient documentation

## 2017-03-15 NOTE — Progress Notes (Signed)
No c/o

## 2017-03-30 ENCOUNTER — Encounter: Payer: Self-pay | Admitting: Family Medicine

## 2017-03-30 ENCOUNTER — Ambulatory Visit (INDEPENDENT_AMBULATORY_CARE_PROVIDER_SITE_OTHER): Payer: BLUE CROSS/BLUE SHIELD | Admitting: Family Medicine

## 2017-03-30 VITALS — BP 144/92 | HR 82 | Resp 16 | Ht 64.0 in | Wt 157.0 lb

## 2017-03-30 DIAGNOSIS — N8110 Cystocele, unspecified: Secondary | ICD-10-CM | POA: Diagnosis not present

## 2017-03-30 DIAGNOSIS — E663 Overweight: Secondary | ICD-10-CM | POA: Diagnosis not present

## 2017-03-30 DIAGNOSIS — I10 Essential (primary) hypertension: Secondary | ICD-10-CM

## 2017-03-30 MED ORDER — AMLODIPINE BESYLATE 5 MG PO TABS: 5.0000 mg | ORAL_TABLET | Freq: Every day | ORAL | 3 refills | Status: DC

## 2017-03-30 NOTE — Progress Notes (Signed)
   Paula Randolph     MRN: [id number]      DOB: June 14, 1961   HPI Paula Randolph is here c/o pelvic pressure as though something had "fallen out' which occurred spontaneously last night while in bed. No aggravating factors noted, had no problem before Denies dysuria or frequency, flank pain fever or chills, c/o "irritation" in area  ROS See HPI  Denies recent fever or chills. Denies sinus pressure, nasal congestion, ear pain or sore throat. Denies chest congestion, productive cough or wheezing. Denies chest pains, palpitations and leg swelling Denies abdominal pain, nausea, vomiting,diarrhea or constipation.   Denies joint pain, swelling and limitation in mobility. Denies headaches, seizures, numbness, or tingling. Denies depression, anxiety or insomnia. Denies skin break down or rash.   PE  BP (!) 144/92   Pulse 82   Resp 16   Ht 5\' 4"  (1.626 m)   Wt 157 lb (71.2 kg)   SpO2 100%   BMI 26.95 kg/m   Patient alert and oriented and in no cardiopulmonary distress.  HEENT: No facial asymmetry, EOMI,   oropharynx pink and moist.  Neck supple no JVD, no mass.  Chest: Clear to auscultation bilaterally.  CVS: S1, S2 no murmurs, no S3.Regular rate.  ABD: Soft non tender. No suprapubic or renal angle tnederness Pelvic: readily reducible bladder prolapse, vaginal walls moist and pink, uterus absent , no adnexal masses Ext: No edema  MS: Adequate ROM spine, shoulders, hips and knees.  Skin: Intact, no ulcerations or rash noted.  Psych: Good eye contact, normal affect. Memory intact not anxious or depressed appearing.  CNS: CN 2-12 intact, power,  normal throughout.no focal deficits noted.   Assessment & Plan  Essential hypertension Uncontrolled increase amlodipine to 5 mg daily DASH diet and commitment to daily physical activity for a minimum of 30 minutes discussed and encouraged, as a part of hypertension management. The importance of attaining a healthy weight is also  discussed.  BP/Weight 03/30/2017 03/15/2017 01/20/2017 12/07/2016 08/16/2016 03/15/2016 01/27/70  Systolic BP 219 758 832 549 826 415 830  Diastolic BP 92 92 90 90 90 91 82  Wt. (Lbs) 157 157.9 161.12 169 174.12 175.27 174  BMI 26.95 27.1 27.66 29.01 29.89 30.07 29.85     Bladder prolapse   Female bladder prolapse 1 day h/o pelvic pressure with prolapse, no aggravating factore. Refer to urology  Overweight (BMI 25.0-29.9) Unchnaged Patient re-educated about  the importance of commitment to a  minimum of 150 minutes of exercise per week.  The importance of healthy food choices with portion control discussed. Encouraged to start a food diary, count calories and to consider  joining a support group. Sample diet sheets offered. Goals set by the patient for the next several months.   Weight /BMI 03/30/2017 03/15/2017 01/20/2017  WEIGHT 157 lb 157 lb 14.4 oz 161 lb 1.9 oz  HEIGHT 5\' 4"  5\' 4"  5\' 4"   BMI 26.95 kg/m2 27.1 kg/m2 27.66 kg/m2

## 2017-03-30 NOTE — Progress Notes (Signed)
144 92  

## 2017-03-30 NOTE — Assessment & Plan Note (Signed)
Unchnaged Patient re-educated about  the importance of commitment to a  minimum of 150 minutes of exercise per week.  The importance of healthy food choices with portion control discussed. Encouraged to start a food diary, count calories and to consider  joining a support group. Sample diet sheets offered. Goals set by the patient for the next several months.   Weight /BMI 03/30/2017 03/15/2017 01/20/2017  WEIGHT 157 lb 157 lb 14.4 oz 161 lb 1.9 oz  HEIGHT 5\' 4"  5\' 4"  5\' 4"   BMI 26.95 kg/m2 27.1 kg/m2 27.66 kg/m2

## 2017-03-30 NOTE — Assessment & Plan Note (Signed)
1 day h/o pelvic pressure with prolapse, no aggravating factore. Refer to urology

## 2017-03-30 NOTE — Patient Instructions (Addendum)
f/u in 2 months, blood pressure is high and medication is increased  You are referred to urology re bladder prolapse  NEW dose of amlodipine is 5 mg one daily, OK to take TWO 2.5 mg tablets till done, as blood pressure is high     DASH Eating Plan DASH stands for "Dietary Approaches to Stop Hypertension." The DASH eating plan is a healthy eating plan that has been shown to reduce high blood pressure (hypertension). It may also reduce your risk for type 2 diabetes, heart disease, and stroke. The DASH eating plan may also help with weight loss. What are tips for following this plan? General guidelines   Avoid eating more than 2,300 mg (milligrams) of salt (sodium) a day. If you have hypertension, you may need to reduce your sodium intake to 1,500 mg a day.  Limit alcohol intake to no more than 1 drink a day for nonpregnant women and 2 drinks a day for men. One drink equals 12 oz of beer, 5 oz of wine, or 1 oz of hard liquor.  Work with your health care provider to maintain a healthy body weight or to lose weight. Ask what an ideal weight is for you.  Get at least 30 minutes of exercise that causes your heart to beat faster (aerobic exercise) most days of the week. Activities may include walking, swimming, or biking.  Work with your health care provider or diet and nutrition specialist (dietitian) to adjust your eating plan to your individual calorie needs. Reading food labels   Check food labels for the amount of sodium per serving. Choose foods with less than 5 percent of the Daily Value of sodium. Generally, foods with less than 300 mg of sodium per serving fit into this eating plan.  To find whole grains, look for the word "whole" as the first word in the ingredient list. Shopping   Buy products labeled as "low-sodium" or "no salt added."  Buy fresh foods. Avoid canned foods and premade or frozen meals. Cooking   Avoid adding salt when cooking. Use salt-free seasonings or herbs  instead of table salt or sea salt. Check with your health care provider or pharmacist before using salt substitutes.  Do not fry foods. Cook foods using healthy methods such as baking, boiling, grilling, and broiling instead.  Cook with heart-healthy oils, such as olive, canola, soybean, or sunflower oil. Meal planning    Eat a balanced diet that includes:  5 or more servings of fruits and vegetables each day. At each meal, try to fill half of your plate with fruits and vegetables.  Up to 6-8 servings of whole grains each day.  Less than 6 oz of lean meat, poultry, or fish each day. A 3-oz serving of meat is about the same size as a deck of cards. One egg equals 1 oz.  2 servings of low-fat dairy each day.  A serving of nuts, seeds, or beans 5 times each week.  Heart-healthy fats. Healthy fats called Omega-3 fatty acids are found in foods such as flaxseeds and coldwater fish, like sardines, salmon, and mackerel.  Limit how much you eat of the following:  Canned or prepackaged foods.  Food that is high in trans fat, such as fried foods.  Food that is high in saturated fat, such as fatty meat.  Sweets, desserts, sugary drinks, and other foods with added sugar.  Full-fat dairy products.  Do not salt foods before eating.  Try to eat at least 2 vegetarian  meals each week.  Eat more home-cooked food and less restaurant, buffet, and fast food.  When eating at a restaurant, ask that your food be prepared with less salt or no salt, if possible. What foods are recommended? The items listed may not be a complete list. Talk with your dietitian about what dietary choices are best for you. Grains  Whole-grain or whole-wheat bread. Whole-grain or whole-wheat pasta. Brown rice. [place]row. Bulgur. Whole-grain and low-sodium cereals. Pita bread. Low-fat, low-sodium crackers. Whole-wheat flour tortillas. Vegetables  Fresh or frozen vegetables (raw, steamed, roasted, or grilled).  Low-sodium or reduced-sodium tomato and vegetable juice. Low-sodium or reduced-sodium tomato sauce and tomato paste. Low-sodium or reduced-sodium canned vegetables. Fruits  All fresh, dried, or frozen fruit. Canned fruit in natural juice (without added sugar). Meat and other protein foods  Skinless chicken or [place]. Ground chicken or [place]. Pork with fat trimmed off. Fish and seafood. Egg whites. Dried beans, peas, or lentils. Unsalted nuts, nut butters, and seeds. Unsalted canned beans. Lean cuts of beef with fat trimmed off. Low-sodium, lean deli meat. Dairy  Low-fat (1%) or fat-free (skim) milk. Fat-free, low-fat, or reduced-fat cheeses. Nonfat, low-sodium ricotta or cottage cheese. Low-fat or nonfat yogurt. Low-fat, low-sodium cheese. Fats and oils  Soft margarine without trans fats. Vegetable oil. Low-fat, reduced-fat, or light mayonnaise and salad dressings (reduced-sodium). Canola, safflower, olive, soybean, and sunflower oils. Avocado. Seasoning and other foods  Herbs. Spices. Seasoning mixes without salt. Unsalted popcorn and pretzels. Fat-free sweets. What foods are not recommended? The items listed may not be a complete list. Talk with your dietitian about what dietary choices are best for you. Grains  Baked goods made with fat, such as croissants, muffins, or some breads. Dry pasta or rice meal packs. Vegetables  Creamed or fried vegetables. Vegetables in a cheese sauce. Regular canned vegetables (not low-sodium or reduced-sodium). Regular canned tomato sauce and paste (not low-sodium or reduced-sodium). Regular tomato and vegetable juice (not low-sodium or reduced-sodium). Angie Fava. Olives. Fruits  Canned fruit in a light or heavy syrup. Fried fruit. Fruit in cream or butter sauce. Meat and other protein foods  Fatty cuts of meat. Ribs. Fried meat. Berniece Salines. Sausage. Bologna and other processed lunch meats. Salami. Fatback. Hotdogs. Bratwurst. Salted nuts and seeds. Canned beans with  added salt. Canned or smoked fish. Whole eggs or egg yolks. Chicken or [place] with skin. Dairy  Whole or 2% milk, cream, and half-and-half. Whole or full-fat cream cheese. Whole-fat or sweetened yogurt. Full-fat cheese. Nondairy creamers. Whipped toppings. Processed cheese and cheese spreads. Fats and oils  Butter. Stick margarine. Lard. Shortening. Ghee. Bacon fat. Tropical oils, such as coconut, palm kernel, or palm oil. Seasoning and other foods  Salted popcorn and pretzels. Onion salt, garlic salt, seasoned salt, table salt, and sea salt. Worcestershire sauce. Tartar sauce. Barbecue sauce. Teriyaki sauce. Soy sauce, including reduced-sodium. Steak sauce. Canned and packaged gravies. Fish sauce. Oyster sauce. Cocktail sauce. Horseradish that you find on the shelf. Ketchup. Mustard. Meat flavorings and tenderizers. Bouillon cubes. Hot sauce and Tabasco sauce. Premade or packaged marinades. Premade or packaged taco seasonings. Relishes. Regular salad dressings. Where to find more information:  National Heart, Lung, and [place]: https://wilson-eaton.com/  American Heart Association: www.heart.org Summary  The DASH eating plan is a healthy eating plan that has been shown to reduce high blood pressure (hypertension). It may also reduce your risk for type 2 diabetes, heart disease, and stroke.  With the DASH eating plan, you should limit salt (sodium)  intake to 2,300 mg a day. If you have hypertension, you may need to reduce your sodium intake to 1,500 mg a day.  When on the DASH eating plan, aim to eat more fresh fruits and vegetables, whole grains, lean proteins, low-fat dairy, and heart-healthy fats.  Work with your health care provider or diet and nutrition specialist (dietitian) to adjust your eating plan to your individual calorie needs. This information is not intended to replace advice given to you by your health care provider. Make sure you discuss any questions you have with your health  care provider. Document Released: 12/02/2011 Document Revised: 12/06/2016 Document Reviewed: 12/06/2016 Elsevier Interactive Patient Education  2017 [employer].

## 2017-03-30 NOTE — Assessment & Plan Note (Signed)
Uncontrolled increase amlodipine to 5 mg daily DASH diet and commitment to daily physical activity for a minimum of 30 minutes discussed and encouraged, as a part of hypertension management. The importance of attaining a healthy weight is also discussed.  BP/Weight 03/30/2017 03/15/2017 01/20/2017 12/07/2016 08/16/2016 03/15/2016 01/22/5169  Systolic BP 017 494 496 759 163 846 659  Diastolic BP 92 92 90 90 90 91 82  Wt. (Lbs) 157 157.9 161.12 169 174.12 175.27 174  BMI 26.95 27.1 27.66 29.01 29.89 30.07 29.85     Bladder prolapse

## 2017-06-01 ENCOUNTER — Encounter: Payer: Self-pay | Admitting: Family Medicine

## 2017-06-01 ENCOUNTER — Ambulatory Visit (INDEPENDENT_AMBULATORY_CARE_PROVIDER_SITE_OTHER): Payer: BLUE CROSS/BLUE SHIELD | Admitting: Family Medicine

## 2017-06-01 VITALS — BP 118/78 | HR 76 | Resp 16 | Ht 64.0 in | Wt 153.0 lb

## 2017-06-01 DIAGNOSIS — D0511 Intraductal carcinoma in situ of right breast: Secondary | ICD-10-CM

## 2017-06-01 DIAGNOSIS — I10 Essential (primary) hypertension: Secondary | ICD-10-CM

## 2017-06-01 MED ORDER — UNABLE TO FIND: 99 refills | Status: AC

## 2017-06-01 NOTE — Assessment & Plan Note (Signed)
Controlled, no change in medication DASH diet and commitment to daily physical activity for a minimum of 30 minutes discussed and encouraged, as a part of hypertension management. The importance of attaining a healthy weight is also discussed.  BP/Weight 06/01/2017 03/30/2017 03/15/2017 01/20/2017 12/07/2016 08/16/2016 01/10/5207  Systolic BP 022 336 122 449 753 005 110  Diastolic BP 78 92 92 90 90 90 91  Wt. (Lbs) 153 157 157.9 161.12 169 174.12 175.27  BMI 26.26 26.95 27.1 27.66 29.01 29.89 30.07

## 2017-06-01 NOTE — Progress Notes (Addendum)
   Paula Randolph     MRN: [id number]      DOB: 07/17/1961   HPI Paula Randolph is here for follow up and re-evaluation of chronic medical conditions,in particular uncontrolled hypertension  Denies adverse s/e from new meds Preventive health is updated, specifically  Cancer screening and Immunization.   Questions or concerns regarding consultations or procedures which the PT has had in the interim are  addressed. Requests mastectomy supplies  Specifics documented per medicare guidelines and supplier in September, , 2018 ROS Denies recent fever or chills. Denies sinus pressure, nasal congestion, ear pain or sore throat. Denies chest congestion, productive cough or wheezing. Denies chest pains, palpitations and leg swelling  Denies headaches, seizures, numbness, or tingling. Denies depression, anxiety or insomnia. Denies skin break down or rash.   PE  BP 118/78   Pulse 76   Resp 16   Ht 5\' 4"  (1.626 m)   Wt 153 lb (69.4 kg)   SpO2 100%   BMI 26.26 kg/m   Patient alert and oriented and in no cardiopulmonary distress.  HEENT: No facial asymmetry, EOMI,   oropharynx pink and moist.  Neck supple no JVD, no mass.  Chest: Clear to auscultation bilaterally.  CVS: S1, S2 no murmurs, no S3.Regular rate.  ABD: Soft non tender.   Ext: No edema   CNS: CN 2-12 intact, power,  normal throughout.no focal deficits noted.   Assessment & Plan  Essential hypertension Controlled, no change in medication DASH diet and commitment to daily physical activity for a minimum of 30 minutes discussed and encouraged, as a part of hypertension management. The importance of attaining a healthy weight is also discussed.  BP/Weight 06/01/2017 03/30/2017 03/15/2017 01/20/2017 12/07/2016 08/16/2016 5/73/2202  Systolic BP 542 706 237 628 315 176 160  Diastolic BP 78 92 92 90 90 90 91  Wt. 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

## 2017-06-01 NOTE — Patient Instructions (Addendum)
Annual physical exam in 5.5 month, call if you need me sooner.  Excellent blood pressure ,stay on same medications  Prescription today for mastectomy supplies x 6 will be given    Fasting , lipid, chem 7 ,  and vit D 1 week before follow up  Thank you  for choosing [facility]. We consider it a privelige to serve you.  Delivering excellent health care in a caring and  compassionate way is our goal.  Partnering with you,  so that together we can achieve this goal is our strategy.

## 2017-06-15 ENCOUNTER — Ambulatory Visit (INDEPENDENT_AMBULATORY_CARE_PROVIDER_SITE_OTHER): Payer: BLUE CROSS/BLUE SHIELD | Admitting: Urology

## 2017-06-15 ENCOUNTER — Other Ambulatory Visit (HOSPITAL_COMMUNITY)
Admission: RE | Admit: 2017-06-15 | Discharge: 2017-06-15 | Disposition: A | Discharge status: Home / Self Care | Payer: BLUE CROSS/BLUE SHIELD | Source: Other Acute Inpatient Hospital | Attending: Urology | Admitting: Urology

## 2017-06-15 DIAGNOSIS — Z853 Personal history of malignant neoplasm of breast: Secondary | ICD-10-CM

## 2017-06-15 DIAGNOSIS — N362 Urethral caruncle: Secondary | ICD-10-CM

## 2017-06-15 DIAGNOSIS — N8110 Cystocele, unspecified: Secondary | ICD-10-CM

## 2017-06-15 DIAGNOSIS — R3989 Other symptoms and signs involving the genitourinary system: Secondary | ICD-10-CM | POA: Insufficient documentation

## 2017-06-16 ENCOUNTER — Inpatient Hospital Stay (HOSPITAL_COMMUNITY): Admit: 2017-06-16 | Payer: Self-pay

## 2017-06-17 LAB — URINE CULTURE: Culture: >=100000 — AB

## 2017-08-17 DIAGNOSIS — N3946 Mixed incontinence: Secondary | ICD-10-CM | POA: Diagnosis not present

## 2017-08-17 DIAGNOSIS — R350 Frequency of micturition: Secondary | ICD-10-CM | POA: Diagnosis not present

## 2017-08-17 DIAGNOSIS — R351 Nocturia: Secondary | ICD-10-CM | POA: Diagnosis not present

## 2017-09-05 NOTE — Assessment & Plan Note (Signed)
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

## 2017-09-19 DIAGNOSIS — N390 Urinary tract infection, site not specified: Secondary | ICD-10-CM | POA: Diagnosis not present

## 2017-09-19 DIAGNOSIS — R300 Dysuria: Secondary | ICD-10-CM | POA: Diagnosis not present

## 2017-09-19 DIAGNOSIS — B958 Unspecified staphylococcus as the cause of diseases classified elsewhere: Secondary | ICD-10-CM | POA: Diagnosis not present

## 2017-09-28 DIAGNOSIS — R351 Nocturia: Secondary | ICD-10-CM | POA: Diagnosis not present

## 2017-09-28 DIAGNOSIS — R350 Frequency of micturition: Secondary | ICD-10-CM | POA: Diagnosis not present

## 2017-09-28 DIAGNOSIS — N3946 Mixed incontinence: Secondary | ICD-10-CM | POA: Diagnosis not present

## 2017-09-29 ENCOUNTER — Other Ambulatory Visit: Payer: Self-pay | Admitting: Family Medicine

## 2017-10-17 DIAGNOSIS — N3946 Mixed incontinence: Secondary | ICD-10-CM | POA: Diagnosis not present

## 2017-10-17 DIAGNOSIS — R350 Frequency of micturition: Secondary | ICD-10-CM | POA: Diagnosis not present

## 2017-10-26 ENCOUNTER — Other Ambulatory Visit: Payer: Self-pay | Admitting: Urology

## 2017-11-29 DIAGNOSIS — N3946 Mixed incontinence: Secondary | ICD-10-CM | POA: Diagnosis not present

## 2017-12-06 ENCOUNTER — Encounter: Payer: BLUE CROSS/BLUE SHIELD | Admitting: Family Medicine

## 2017-12-07 NOTE — Patient Instructions (Signed)
Goddess Gebbia  12/07/2017   Your procedure is scheduled on: 12-13-17  Report to [facility]  Entrance Take [place]  elevators to 3rd floor to  [place] at 530AM.    Call this number if you have problems the morning of surgery [phone]    Remember: ONLY 1 PERSON MAY GO WITH YOU TO SHORT STAY TO GET  READY MORNING OF YOUR SURGERY.    Do not eat food or drink liquids :After Midnight.     Take these medicines the morning of surgery with A SIP OF WATER: amlodipine                                 You may not have any metal on your body including hair pins and              piercings  Do not wear jewelry, make-up, lotions, powders or perfumes, deodorant             Do not wear nail polish.  Do not shave  48 hours prior to surgery.         Do not bring valuables to the hospital. [place].  Contacts, dentures or bridgework may not be worn into surgery.  Leave suitcase in the car. After surgery it may be brought to your room.                Please read over the following fact sheets you were given: _____________________________________________________________________             [facility] - Preparing for Surgery Before surgery, you can play an important role.  Because skin is not sterile, your skin needs to be as free of germs as possible.  You can reduce the number of germs on your skin by washing with CHG (chlorahexidine gluconate) soap before surgery.  CHG is an antiseptic cleaner which kills germs and bonds with the skin to continue killing germs even after washing. Please DO NOT use if you have an allergy to CHG or antibacterial soaps.  If your skin becomes reddened/irritated stop using the CHG and inform your nurse when you arrive at Short Stay. Do not shave (including legs and underarms) for at least 48 hours prior to the first CHG shower.  You may shave your face/neck. Please follow these  instructions carefully:  1.  Shower with CHG Soap the night before surgery and the  morning of Surgery.  2.  If you choose to wash your hair, wash your hair first as usual with your  normal  shampoo.  3.  After you shampoo, rinse your hair and body thoroughly to remove the  shampoo.                           4.  Use CHG as you would any other liquid soap.  You can apply chg directly  to the skin and wash                       Gently with a scrungie or clean washcloth.  5.  Apply the CHG Soap to your body ONLY FROM THE NECK DOWN.   Do  not use on face/ open                           Wound or open sores. Avoid contact with eyes, ears mouth and genitals (private parts).                       Wash face,  Genitals (private parts) with your normal soap.             6.  Wash thoroughly, paying special attention to the area where your surgery  will be performed.  7.  Thoroughly rinse your body with warm water from the neck down.  8.  DO NOT shower/wash with your normal soap after using and rinsing off  the CHG Soap.                9.  Pat yourself dry with a clean towel.            10.  Wear clean pajamas.            11.  Place clean sheets on your bed the night of your first shower and do not  sleep with pets. Day of Surgery : Do not apply any lotions/deodorants the morning of surgery.  Please wear clean clothes to the hospital/surgery center.  FAILURE TO FOLLOW THESE INSTRUCTIONS MAY RESULT IN THE CANCELLATION OF YOUR SURGERY PATIENT SIGNATURE_________________________________  NURSE SIGNATURE__________________________________  ________________________________________________________________________  WHAT IS A BLOOD TRANSFUSION? Blood Transfusion Information  A transfusion is the replacement of blood or some of its parts. Blood is made up of multiple cells which provide different functions.  Red blood cells carry oxygen and are used for blood loss replacement.  White blood cells fight against  infection.  Platelets control bleeding.  Plasma helps clot blood.  Other blood products are available for specialized needs, such as hemophilia or other clotting disorders. BEFORE THE TRANSFUSION  Who gives blood for transfusions?   Healthy volunteers who are fully evaluated to make sure their blood is safe. This is blood bank blood. Transfusion therapy is the safest it has ever been in the practice of medicine. Before blood is taken from a donor, a complete history is taken to make sure that person has no history of diseases nor engages in risky social behavior (examples are intravenous drug use or sexual activity with multiple partners). The donor's travel history is screened to minimize risk of transmitting infections, such as malaria. The donated blood is tested for signs of infectious diseases, such as HIV and hepatitis. The blood is then tested to be sure it is compatible with you in order to minimize the chance of a transfusion reaction. If you or a relative donates blood, this is often done in anticipation of surgery and is not appropriate for emergency situations. It takes many days to process the donated blood. RISKS AND COMPLICATIONS Although transfusion therapy is very safe and saves many lives, the main dangers of transfusion include:   Getting an infectious disease.  Developing a transfusion reaction. This is an allergic reaction to something in the blood you were given. Every precaution is taken to prevent this. The decision to have a blood transfusion has been considered carefully by your caregiver before blood is given. Blood is not given unless the benefits outweigh the risks. AFTER THE TRANSFUSION  Right after receiving a blood transfusion, you will usually feel much better and more energetic. This is especially true  if your red blood cells have gotten low (anemic). The transfusion raises the level of the red blood cells which carry oxygen, and this usually causes an energy  increase.  The nurse administering the transfusion will monitor you carefully for complications. HOME CARE INSTRUCTIONS  No special instructions are needed after a transfusion. You may find your energy is better. Speak with your caregiver about any limitations on activity for underlying diseases you may have. SEEK MEDICAL CARE IF:   Your condition is not improving after your transfusion.  You develop redness or irritation at the intravenous (IV) site. SEEK IMMEDIATE MEDICAL CARE IF:  Any of the following symptoms occur over the next 12 hours:  Shaking chills.  You have a temperature by mouth above 102 F (38.9 C), not controlled by medicine.  Chest, back, or muscle pain.  People around you feel you are not acting correctly or are confused.  Shortness of breath or difficulty breathing.  Dizziness and fainting.  You get a rash or develop hives.  You have a decrease in urine output.  Your urine turns a dark color or changes to pink, red, or brown. Any of the following symptoms occur over the next 10 days:  You have a temperature by mouth above 102 F (38.9 C), not controlled by medicine.  Shortness of breath.  Weakness after normal activity.  The white part of the eye turns yellow (jaundice).  You have a decrease in the amount of urine or are urinating less often.  Your urine turns a dark color or changes to pink, red, or brown. Document Released: 12/10/2000 Document Revised: 03/06/2012 Document Reviewed: 07/29/2008 [facility] Patient Information 2014 [place], [place].  _______________________________________________________________________

## 2017-12-09 ENCOUNTER — Encounter (HOSPITAL_COMMUNITY): Payer: Self-pay

## 2017-12-09 ENCOUNTER — Other Ambulatory Visit: Payer: Self-pay

## 2017-12-09 ENCOUNTER — Encounter (HOSPITAL_COMMUNITY)
Admission: RE | Admit: 2017-12-09 | Discharge: 2017-12-09 | Disposition: A | Discharge status: Home / Self Care | Payer: BLUE CROSS/BLUE SHIELD | Source: Ambulatory Visit | Attending: Urology | Admitting: Urology

## 2017-12-09 DIAGNOSIS — N393 Stress incontinence (female) (male): Secondary | ICD-10-CM | POA: Insufficient documentation

## 2017-12-09 DIAGNOSIS — Z01810 Encounter for preprocedural cardiovascular examination: Secondary | ICD-10-CM | POA: Insufficient documentation

## 2017-12-09 DIAGNOSIS — Z01812 Encounter for preprocedural laboratory examination: Secondary | ICD-10-CM | POA: Diagnosis not present

## 2017-12-09 LAB — BASIC METABOLIC PANEL
Anion gap: 9 (ref 5–15)
BUN: 17 mg/dL (ref 6–20)
CHLORIDE: 105 mmol/L (ref 101–111)
CO2: 26 mmol/L (ref 22–32)
CREATININE: 0.66 mg/dL (ref 0.44–1.00)
Calcium: 9.6 mg/dL (ref 8.9–10.3)
GFR calc Af Amer: >60 mL/min (ref >60)
GFR calc non Af Amer: >60 mL/min (ref >60)
GLUCOSE: 113 mg/dL — AB (ref 65–99)
Potassium: 3.9 mmol/L (ref 3.5–5.1)
Sodium: 140 mmol/L (ref 135–145)

## 2017-12-09 LAB — CBC
HCT: 36.7 % (ref 36.0–46.0)
Hemoglobin: 11.3 g/dL — ABNORMAL LOW (ref 12.0–15.0)
MCH: 26.7 pg (ref 26.0–34.0)
MCHC: 30.8 g/dL (ref 30.0–36.0)
MCV: 86.6 fL (ref 78.0–100.0)
PLATELETS: 345 10*3/uL (ref 150–400)
RBC: 4.24 MIL/uL (ref 3.87–5.11)
RDW: 12.9 % (ref 11.5–15.5)
WBC: 5.7 10*3/uL (ref 4.0–10.5)

## 2017-12-09 LAB — ABO/RH: ABO/RH(D): O POS

## 2017-12-09 LAB — PROTIME-INR
INR: 1
Prothrombin Time: 13.1 seconds (ref 11.4–15.2)

## 2017-12-12 ENCOUNTER — Encounter (HOSPITAL_COMMUNITY): Payer: Self-pay | Admitting: Anesthesiology

## 2017-12-12 MED ORDER — GENTAMICIN SULFATE 40 MG/ML IJ SOLN
350.0000 mg | Freq: Once | INTRAVENOUS | Status: AC
  Administered 2017-12-13: 350 mg via INTRAVENOUS
  Filled 2017-12-12 (×2): qty 8.75

## 2017-12-12 NOTE — H&P (Signed)
I was consulted by the above provider to assess the patient's prolapse worsening since May. She feels a bulge. She might do a little bit of splinting for urination but not for her bowels. She has had a hysterectomy.   She leaks a little bit with coughing sneezing and sometimes with urgency but the urge incontinence is not every day. Sometimes she wears 2 pads a day that are Another days. Does not wear pads. She denies bedwetting   She voids every 3 hours and gets up twice a night with a reasonable flow   Grade 3 cystocele with moderate central defect. Her vaginal cuff did not descend a lot and only a few centimeters but she could not cough hard. She had no rectocele. She had no stress incontinence. She had mild hypermobility of the bladder neck   The patient has mild mixed incontinence and symptomatic prolapse. She has mild nocturia. The role of urodynamics was discussed. If she ever has surgery she likely would best benefit from a transvaginal vault suspension with cystocele repairing graft and possible sling. Based upon the size of cystocele likely the cuff descends further.   Today the patient has stable prolapse and incontinence.  On urodynamics she did not void was catheterized for 100 mL. Her initial residual was 0 mL. Her bladder capacity was 380 mL. Her bladder was unstable reaching pressures of 10 cm water but she did not leak. She did not leak with a Valsalva pressure of 75 cm of water. This was with and without the prolapse reduced. During voluntary voiding she voided 300 mL with a maximum flow of 7 mils per second and maximum voiding pressure 22 cm water. Residual was 80 mL. EMG activity was increased during the voiding phase. Bladder neck descended 2 or 3 cm and she had a cystocele noted. Her bladder was a bit hypersensitive. The details of the urodynamics are signed and dictated   I discussed the prolapse surgery noted above. Watchful waiting and a pessary were described.   I discussed  physical therapy and watchful waiting and a sling for stress incontinence. I urged her to consider nonsurgical treatment for her mild incontinence subjectively and objectively. Mesh issues discussed   The patient would like to have the sling with the prolapse surgery. She is sexually active. She did not want a pessary. She would like to have a running fixed     ALLERGIES: No Allergies    MEDICATIONS: Amlodipine Besylate 5 mg tablet  Aspirin Ec 81 mg tablet, delayed release  Triamterene-Hydrochlorothiazid 75 mg-50 mg tablet  Vitamin D3     GU PSH: Catheterize For Residual - 09/19/2017 Complex cystometrogram, w/ void pressure and urethral pressure profile studies, any technique - 09/28/2017 Complex Uroflow - 09/28/2017 Emg surf Electrd - 09/28/2017 Hysterectomy Inject For cystogram - 09/28/2017 Intrabd voidng Press - 09/28/2017      PSH Notes: Breast cancer surgery   NON-GU PSH: Appendectomy    GU PMH: Mixed incontinence - 08/17/2017 Nocturia - 08/17/2017 Urinary Frequency - 08/17/2017    NON-GU PMH: Breast Cancer, History Hypertension    FAMILY HISTORY: No Family History    SOCIAL HISTORY: Marital Status: Single Current Smoking Status: Patient has never smoked.   Tobacco Use Assessment Completed: Used Tobacco in last 30 days? Has never drank.  Drinks 1 caffeinated drink per day.    REVIEW OF SYSTEMS:    GU Review Female:   Patient denies frequent urination, hard to postpone urination, burning /pain with urination, get up  at night to urinate, leakage of urine, stream starts and stops, trouble starting your stream, have to strain to urinate, and being pregnant.  Gastrointestinal (Upper):   Patient denies nausea, vomiting, and indigestion/ heartburn.  Gastrointestinal (Lower):   Patient denies diarrhea and constipation.  Constitutional:   Patient denies fever, night sweats, weight loss, and fatigue.  Skin:   Patient denies itching and skin rash/ lesion.  Eyes:   Patient denies  blurred vision and double vision.  Ears/ Nose/ Throat:   Patient denies sore throat and sinus problems.  Hematologic/Lymphatic:   Patient denies swollen glands and easy bruising.  Cardiovascular:   Patient denies leg swelling and chest pains.  Respiratory:   Patient denies cough and shortness of breath.  Endocrine:   Patient denies excessive thirst.  Musculoskeletal:   Patient denies back pain and joint pain.  Neurological:   Patient denies headaches and dizziness.  Psychologic:   Patient denies depression and anxiety.   VITAL SIGNS: None   PAST DATA REVIEWED:  Source Of History:  Patient   PROCEDURES:          Urinalysis w/Scope Dipstick Dipstick Cont'd Micro  Color: Yellow Bilirubin: Neg WBC/hpf: 0 - 5/hpf  Appearance: Clear Ketones: Trace RBC/hpf: NS (Not Seen)  Specific Gravity: 1.025 Blood: Neg Bacteria: Few (10-25/hpf)  pH: 6.0 Protein: Neg Cystals: Ca Oxalate  Glucose: Neg Urobilinogen: 0.2 Casts: NS (Not Seen)    Nitrites: Neg Trichomonas: Not Present    Leukocyte Esterase: 1+ Mucous: Not Present      Epithelial Cells: 0 - 5/hpf      Yeast: NS (Not Seen)      Sperm: Not Present    ASSESSMENT:      ICD-10 Details  1 GU:   Mixed incontinence - N39.46   2   Urinary Frequency - R35.0               Notes:   I drew her a picture and we talked about prolapse surgery in detail. Pros, cons, general surgical and anesthetic risks, and other options including behavioral therapy, pessaries, and watchful waiting were discussed. She understands that prolapse repairs are successful in 80-85% of cases for prolapse symptoms and can recur anteriorly, posteriorly, and/or apically. She understands that in most cases I use a graft and general risks were discussed. Surgical risks were described but not limited to the discussion of injury to neighboring structures including the bowel (with possible life-threatening sepsis and colostomy), bladder, urethra, vagina (all resulting in further  surgery), and ureter (resulting in re-implantation). We talked about injury to nerves/soft tissue leading to debilitating and intractable pelvic, abdominal, and lower extremity pain syndromes and neuropathies. The risks of buttock pain, intractable dyspareunia, and vaginal narrowing and shortening with sequelae were discussed. Bleeding risks, transfusion rates, and infection were discussed. The risk of persistent, de novo, or worsening bladder and/or bowel incontinence/dysfunction was discussed. The need for CIC was described as well the usual post-operative course. The patient understands that she might not reach her treatment goal and that she might be worse following surgery. MEsh discussed   We talked about a sling in detail. Pros, cons, general surgical and anesthetic risks, and other options including behavioral therapy and watchful waiting were discussed. She understands that slings are generally successful in 90% of cases for stress incontinence, 50% for urge incontinence, and that in a small % of cases the incontinence can worsen. The risk of persistent, de novo, or worsening incontinence/dysfunction was discussed. Risks were  described but not limited to the discussion of injury to neighboring structures including the bowel (with possible life-threatening sepsis and colostomy), bladder, urethra, vagina (all resulting in further surgery), and ureter (resulting in re-implantation). We also talked about the risk of retention requiring urethrolysis, extrusion requiring revision, and erosion resulting in further surgery. Bleeding risks and transfusion rates and the risk of infection were discussed. The risk of pelvic and abdominal pain syndromes, dyspareunia, and neuropathies were discussed. The need for CIC was described as well the usual post-operative course. The patient understands that she might not reach her treatment goal and that she might be worse following surgery.    After a thorough review of the  management options for the patient's condition the patient  elected to proceed with surgical therapy as noted above. We have discussed the potential benefits and risks of the procedure, side effects of the proposed treatment, the likelihood of the patient achieving the goals of the procedure, and any potential problems that might occur during the procedure or recuperation. Informed consent has been obtained.

## 2017-12-12 NOTE — Anesthesia Preprocedure Evaluation (Addendum)
Anesthesia Evaluation  Patient identified by MRN, date of birth, ID band Patient awake    Reviewed: Allergy & Precautions, NPO status , Patient's Chart, lab work & pertinent test results  Airway Mallampati: I       Dental  (+) Edentulous Upper, Edentulous Lower   Pulmonary neg pulmonary ROS,    Pulmonary exam normal breath sounds clear to auscultation       Cardiovascular hypertension, Pt. on medications Normal cardiovascular exam Rhythm:Regular Rate:Normal     Neuro/Psych negative psych ROS   GI/Hepatic negative GI ROS, Neg liver ROS,   Endo/Other  negative endocrine ROS  Renal/GU negative Renal ROS  Female GU complaint     Musculoskeletal   Abdominal Normal abdominal exam  (+)   Peds  Hematology negative hematology ROS (+)   Anesthesia Other Findings   Reproductive/Obstetrics                           Anesthesia Physical Anesthesia Plan  ASA: II  Anesthesia Plan: General   Post-op Pain Management:    Induction: Intravenous  PONV Risk Score and Plan: 4 or greater and Ondansetron, Dexamethasone, Scopolamine patch - Pre-op and Midazolam  Airway Management Planned: Oral ETT  Additional Equipment:   Intra-op Plan:   Post-operative Plan: Extubation in OR  Informed Consent: I have reviewed the patients History and Physical, chart, labs and discussed the procedure including the risks, benefits and alternatives for the proposed anesthesia with the patient or authorized representative who has indicated his/her understanding and acceptance.   Dental advisory given  Plan Discussed with: Surgeon  Anesthesia Plan Comments:       Anesthesia Quick Evaluation

## 2017-12-13 ENCOUNTER — Encounter (HOSPITAL_COMMUNITY): Payer: Self-pay | Admitting: *Deleted

## 2017-12-13 ENCOUNTER — Other Ambulatory Visit: Payer: Self-pay

## 2017-12-13 ENCOUNTER — Ambulatory Visit (HOSPITAL_COMMUNITY): Payer: BLUE CROSS/BLUE SHIELD | Admitting: Anesthesiology

## 2017-12-13 ENCOUNTER — Encounter (HOSPITAL_COMMUNITY)
Admission: RE | Disposition: A | Discharge status: Home / Self Care | Payer: Self-pay | Source: Ambulatory Visit | Attending: Urology

## 2017-12-13 ENCOUNTER — Observation Stay (HOSPITAL_COMMUNITY)
Admission: RE | Admit: 2017-12-13 | Discharge: 2017-12-14 | Disposition: A | Discharge status: Home / Self Care | Payer: BLUE CROSS/BLUE SHIELD | Source: Ambulatory Visit | Attending: Urology | Admitting: Urology

## 2017-12-13 DIAGNOSIS — Z7982 Long term (current) use of aspirin: Secondary | ICD-10-CM | POA: Insufficient documentation

## 2017-12-13 DIAGNOSIS — N3946 Mixed incontinence: Secondary | ICD-10-CM | POA: Insufficient documentation

## 2017-12-13 DIAGNOSIS — N8110 Cystocele, unspecified: Principal | ICD-10-CM | POA: Insufficient documentation

## 2017-12-13 DIAGNOSIS — E559 Vitamin D deficiency, unspecified: Secondary | ICD-10-CM | POA: Diagnosis not present

## 2017-12-13 DIAGNOSIS — Z853 Personal history of malignant neoplasm of breast: Secondary | ICD-10-CM | POA: Insufficient documentation

## 2017-12-13 DIAGNOSIS — I10 Essential (primary) hypertension: Secondary | ICD-10-CM | POA: Insufficient documentation

## 2017-12-13 DIAGNOSIS — Z23 Encounter for immunization: Secondary | ICD-10-CM | POA: Insufficient documentation

## 2017-12-13 DIAGNOSIS — Z79899 Other long term (current) drug therapy: Secondary | ICD-10-CM | POA: Insufficient documentation

## 2017-12-13 DIAGNOSIS — N819 Female genital prolapse, unspecified: Secondary | ICD-10-CM | POA: Diagnosis not present

## 2017-12-13 DIAGNOSIS — N8111 Cystocele, midline: Secondary | ICD-10-CM | POA: Diagnosis not present

## 2017-12-13 DIAGNOSIS — N393 Stress incontinence (female) (male): Secondary | ICD-10-CM | POA: Diagnosis not present

## 2017-12-13 HISTORY — PX: PUBOVAGINAL SLING: SHX1035

## 2017-12-13 HISTORY — PX: VAGINAL PROLAPSE REPAIR: SHX830

## 2017-12-13 HISTORY — PX: CYSTOCELE REPAIR: SHX163

## 2017-12-13 LAB — TYPE AND SCREEN
ABO/RH(D): O POS
ANTIBODY SCREEN: NEGATIVE

## 2017-12-13 LAB — HEMOGLOBIN AND HEMATOCRIT, BLOOD
HCT: 33.4 % — ABNORMAL LOW (ref 36.0–46.0)
HEMOGLOBIN: 10.3 g/dL — AB (ref 12.0–15.0)

## 2017-12-13 SURGERY — COLPORRHAPHY, ANTERIOR, FOR CYSTOCELE REPAIR
Anesthesia: General

## 2017-12-13 MED ORDER — CIPROFLOXACIN HCL 250 MG PO TABS: 250.0000 mg | ORAL_TABLET | Freq: Two times a day (BID) | ORAL | 0 refills | Status: DC

## 2017-12-13 MED ORDER — EPHEDRINE SULFATE-NACL 50-0.9 MG/10ML-% IV SOSY
PREFILLED_SYRINGE | INTRAVENOUS | Status: DC | PRN
  Administered 2017-12-13 (×4): 5 mg via INTRAVENOUS

## 2017-12-13 MED ORDER — DEXTROSE-NACL 5-0.45 % IV SOLN
INTRAVENOUS | Status: DC
  Administered 2017-12-13 – 2017-12-14 (×2): via INTRAVENOUS

## 2017-12-13 MED ORDER — HYDROCODONE-ACETAMINOPHEN 5-325 MG PO TABS: 1.0000 | ORAL_TABLET | Freq: Four times a day (QID) | ORAL | 0 refills | Status: DC | PRN

## 2017-12-13 MED ORDER — HYDROCODONE-ACETAMINOPHEN 5-325 MG PO TABS
1.0000 | ORAL_TABLET | ORAL | Status: DC | PRN
  Administered 2017-12-13: 2 via ORAL
  Filled 2017-12-13: qty 2

## 2017-12-13 MED ORDER — FENTANYL CITRATE (PF) 250 MCG/5ML IJ SOLN
INTRAMUSCULAR | Status: AC
  Filled 2017-12-13: qty 5

## 2017-12-13 MED ORDER — EPHEDRINE 5 MG/ML INJ
INTRAVENOUS | Status: AC
  Filled 2017-12-13: qty 10

## 2017-12-13 MED ORDER — MEPERIDINE HCL 50 MG/ML IJ SOLN: 6.2500 mg | INTRAMUSCULAR | Status: DC | PRN

## 2017-12-13 MED ORDER — ROCURONIUM BROMIDE 50 MG/5ML IV SOSY
PREFILLED_SYRINGE | INTRAVENOUS | Status: AC
  Filled 2017-12-13: qty 5

## 2017-12-13 MED ORDER — KETOROLAC TROMETHAMINE 30 MG/ML IJ SOLN
30.0000 mg | Freq: Once | INTRAMUSCULAR | Status: DC | PRN
  Administered 2017-12-13: 30 mg via INTRAVENOUS

## 2017-12-13 MED ORDER — DEXAMETHASONE SODIUM PHOSPHATE 10 MG/ML IJ SOLN
INTRAMUSCULAR | Status: DC | PRN
Start: 2017-12-13 — End: 2017-12-13
  Administered 2017-12-13: 10 mg via INTRAVENOUS

## 2017-12-13 MED ORDER — HYDROMORPHONE HCL 1 MG/ML IJ SOLN
0.2500 mg | INTRAMUSCULAR | Status: DC | PRN
  Administered 2017-12-13: 0.5 mg via INTRAVENOUS

## 2017-12-13 MED ORDER — INFLUENZA VAC SPLIT QUAD 0.5 ML IM SUSY
0.5000 mL | PREFILLED_SYRINGE | INTRAMUSCULAR | Status: AC
  Administered 2017-12-14: 0.5 mL via INTRAMUSCULAR
  Filled 2017-12-13: qty 0.5

## 2017-12-13 MED ORDER — KETOROLAC TROMETHAMINE 30 MG/ML IJ SOLN
INTRAMUSCULAR | Status: AC
  Filled 2017-12-13: qty 1

## 2017-12-13 MED ORDER — LIDOCAINE-EPINEPHRINE (PF) 1 %-1:200000 IJ SOLN
INTRAMUSCULAR | Status: AC
  Filled 2017-12-13: qty 30

## 2017-12-13 MED ORDER — LACTATED RINGERS IV SOLN
INTRAVENOUS | Status: DC
  Administered 2017-12-13: 12:00:00 via INTRAVENOUS

## 2017-12-13 MED ORDER — MIDAZOLAM HCL 2 MG/2ML IJ SOLN
INTRAMUSCULAR | Status: AC
  Filled 2017-12-13: qty 2

## 2017-12-13 MED ORDER — CEFAZOLIN SODIUM-DEXTROSE 2-4 GM/100ML-% IV SOLN
INTRAVENOUS | Status: AC
  Filled 2017-12-13: qty 100

## 2017-12-13 MED ORDER — DIPHENHYDRAMINE HCL 12.5 MG/5ML PO ELIX: 12.5000 mg | ORAL_SOLUTION | Freq: Four times a day (QID) | ORAL | Status: DC | PRN

## 2017-12-13 MED ORDER — ONDANSETRON HCL 4 MG/2ML IJ SOLN: 4.0000 mg | INTRAMUSCULAR | Status: DC | PRN

## 2017-12-13 MED ORDER — DEXAMETHASONE SODIUM PHOSPHATE 10 MG/ML IJ SOLN
INTRAMUSCULAR | Status: AC
  Filled 2017-12-13: qty 1

## 2017-12-13 MED ORDER — TRIAMTERENE-HCTZ 75-50 MG PO TABS
1.0000 | ORAL_TABLET | Freq: Every day | ORAL | Status: DC
  Administered 2017-12-14: 1 via ORAL
  Filled 2017-12-13: qty 1

## 2017-12-13 MED ORDER — ROCURONIUM BROMIDE 10 MG/ML (PF) SYRINGE
PREFILLED_SYRINGE | INTRAVENOUS | Status: DC | PRN
  Administered 2017-12-13 (×3): 10 mg via INTRAVENOUS
  Administered 2017-12-13: 40 mg via INTRAVENOUS

## 2017-12-13 MED ORDER — AMLODIPINE BESYLATE 5 MG PO TABS
5.0000 mg | ORAL_TABLET | Freq: Every day | ORAL | Status: DC
  Administered 2017-12-14: 5 mg via ORAL
  Filled 2017-12-13: qty 1

## 2017-12-13 MED ORDER — FENTANYL CITRATE (PF) 100 MCG/2ML IJ SOLN
INTRAMUSCULAR | Status: DC | PRN
  Administered 2017-12-13 (×3): 50 ug via INTRAVENOUS
  Administered 2017-12-13: 100 ug via INTRAVENOUS

## 2017-12-13 MED ORDER — SUGAMMADEX SODIUM 200 MG/2ML IV SOLN
INTRAVENOUS | Status: AC
  Filled 2017-12-13: qty 2

## 2017-12-13 MED ORDER — SCOPOLAMINE 1 MG/3DAYS TD PT72
MEDICATED_PATCH | TRANSDERMAL | Status: DC | PRN
  Administered 2017-12-13: 1 via TRANSDERMAL

## 2017-12-13 MED ORDER — ESTRADIOL 0.1 MG/GM VA CREA
TOPICAL_CREAM | VAGINAL | Status: AC
  Filled 2017-12-13: qty 85

## 2017-12-13 MED ORDER — PROPOFOL 10 MG/ML IV BOLUS
INTRAVENOUS | Status: DC | PRN
  Administered 2017-12-13: 160 mg via INTRAVENOUS

## 2017-12-13 MED ORDER — CEFAZOLIN SODIUM-DEXTROSE 2-4 GM/100ML-% IV SOLN
2.0000 g | INTRAVENOUS | Status: AC
  Administered 2017-12-13: 2 g via INTRAVENOUS

## 2017-12-13 MED ORDER — LACTATED RINGERS IV SOLN
INTRAVENOUS | Status: DC | PRN
  Administered 2017-12-13: 07:00:00 via INTRAVENOUS

## 2017-12-13 MED ORDER — SCOPOLAMINE 1 MG/3DAYS TD PT72
MEDICATED_PATCH | TRANSDERMAL | Status: AC
  Filled 2017-12-13: qty 1

## 2017-12-13 MED ORDER — PROMETHAZINE HCL 25 MG/ML IJ SOLN: 6.2500 mg | INTRAMUSCULAR | Status: DC | PRN

## 2017-12-13 MED ORDER — PHENAZOPYRIDINE HCL 200 MG PO TABS
200.0000 mg | ORAL_TABLET | ORAL | Status: AC
  Administered 2017-12-13: 200 mg via ORAL
  Filled 2017-12-13: qty 1

## 2017-12-13 MED ORDER — ACETAMINOPHEN 325 MG PO TABS: 650.0000 mg | ORAL_TABLET | ORAL | Status: DC | PRN

## 2017-12-13 MED ORDER — PROPOFOL 10 MG/ML IV BOLUS
INTRAVENOUS | Status: AC
  Filled 2017-12-13: qty 20

## 2017-12-13 MED ORDER — ONDANSETRON HCL 4 MG/2ML IJ SOLN
INTRAMUSCULAR | Status: DC | PRN
  Administered 2017-12-13: 4 mg via INTRAVENOUS

## 2017-12-13 MED ORDER — LIDOCAINE 2% (20 MG/ML) 5 ML SYRINGE
INTRAMUSCULAR | Status: AC
  Filled 2017-12-13: qty 5

## 2017-12-13 MED ORDER — FLUORESCEIN SODIUM 10 % IV SOLN
INTRAVENOUS | Status: AC
  Filled 2017-12-13: qty 5

## 2017-12-13 MED ORDER — LIDOCAINE-EPINEPHRINE (PF) 1 %-1:200000 IJ SOLN
INTRAMUSCULAR | Status: DC | PRN
  Administered 2017-12-13: 25 mL

## 2017-12-13 MED ORDER — MORPHINE SULFATE (PF) 4 MG/ML IV SOLN: 2.0000 mg | INTRAVENOUS | Status: DC | PRN

## 2017-12-13 MED ORDER — SUGAMMADEX SODIUM 200 MG/2ML IV SOLN
INTRAVENOUS | Status: DC | PRN
  Administered 2017-12-13: 125 mg via INTRAVENOUS

## 2017-12-13 MED ORDER — ONDANSETRON HCL 4 MG/2ML IJ SOLN
INTRAMUSCULAR | Status: AC
  Filled 2017-12-13: qty 2

## 2017-12-13 MED ORDER — MIDAZOLAM HCL 5 MG/5ML IJ SOLN
INTRAMUSCULAR | Status: DC | PRN
Start: 2017-12-13 — End: 2017-12-13
  Administered 2017-12-13: 2 mg via INTRAVENOUS

## 2017-12-13 MED ORDER — SODIUM CHLORIDE 0.9 % IV SOLN
INTRAVENOUS | Status: AC
  Filled 2017-12-13: qty 500000

## 2017-12-13 MED ORDER — LIDOCAINE 2% (20 MG/ML) 5 ML SYRINGE
INTRAMUSCULAR | Status: DC | PRN
  Administered 2017-12-13: 100 mg via INTRAVENOUS

## 2017-12-13 MED ORDER — FLUORESCEIN SODIUM 10 % IV SOLN
INTRAVENOUS | Status: DC | PRN
  Administered 2017-12-13: .5 mL via INTRAVENOUS

## 2017-12-13 MED ORDER — ESTRADIOL 0.1 MG/GM VA CREA
TOPICAL_CREAM | VAGINAL | Status: DC | PRN
  Administered 2017-12-13: 2 via VAGINAL

## 2017-12-13 MED ORDER — STERILE WATER FOR IRRIGATION IR SOLN
Status: DC | PRN
  Administered 2017-12-13: 3000 mL

## 2017-12-13 MED ORDER — HYDROMORPHONE HCL 1 MG/ML IJ SOLN
INTRAMUSCULAR | Status: AC
  Filled 2017-12-13: qty 1

## 2017-12-13 MED ORDER — DIPHENHYDRAMINE HCL 50 MG/ML IJ SOLN: 12.5000 mg | Freq: Four times a day (QID) | INTRAMUSCULAR | Status: DC | PRN

## 2017-12-13 SURGICAL SUPPLY — 56 items
ALLOGRAFT TUTOPLAST AXIS 6X12 (Tissue) ×1 IMPLANT
BAG URINE DRAINAGE (UROLOGICAL SUPPLIES) ×2 IMPLANT
BLADE HEX COATED 2.75 (ELECTRODE) ×2 IMPLANT
BLADE SURG 15 STRL LF DISP TIS (BLADE) ×1 IMPLANT
BLADE SURG 15 STRL SS (BLADE) ×1
CATH FOLEY 2WAY SLVR  5CC 14FR (CATHETERS) ×1
CATH FOLEY 2WAY SLVR 5CC 14FR (CATHETERS) ×1 IMPLANT
COVER MAYO STAND STRL (DRAPES) IMPLANT
DECANTER SPIKE VIAL GLASS SM (MISCELLANEOUS) ×2 IMPLANT
DERMABOND ADVANCED (GAUZE/BANDAGES/DRESSINGS) ×1
DERMABOND ADVANCED .7 DNX12 (GAUZE/BANDAGES/DRESSINGS) ×1 IMPLANT
DEVICE CAPIO SLIM SINGLE (INSTRUMENTS) ×2 IMPLANT
DRAIN PENROSE 18X1/4 LTX STRL (WOUND CARE) ×2 IMPLANT
DRAPE SHEET LG 3/4 BI-LAMINATE (DRAPES) ×2 IMPLANT
ELECT PENCIL ROCKER SW 15FT (MISCELLANEOUS) ×2 IMPLANT
GAUZE PACKING 2X5 YD STRL (GAUZE/BANDAGES/DRESSINGS) ×4 IMPLANT
GAUZE SPONGE 4X4 16PLY XRAY LF (GAUZE/BANDAGES/DRESSINGS) ×8 IMPLANT
GLOVE BIO SURGEON STRL SZ 6.5 (GLOVE) ×2 IMPLANT
GLOVE BIOGEL M STRL SZ7.5 (GLOVE) ×6 IMPLANT
GLOVE ECLIPSE 8.0 STRL XLNG CF (GLOVE) ×4 IMPLANT
GLOVE ECLIPSE 8.5 STRL (GLOVE) ×2 IMPLANT
GOWN STRL REUS W/TWL XL LVL3 (GOWN DISPOSABLE) ×2 IMPLANT
HOLDER FOLEY CATH W/STRAP (MISCELLANEOUS) ×2 IMPLANT
IV NS 1000ML (IV SOLUTION) ×1
IV NS 1000ML BAXH (IV SOLUTION) ×1 IMPLANT
KIT BASIN OR (CUSTOM PROCEDURE TRAY) ×2 IMPLANT
NEEDLE HYPO 22GX1.5 SAFETY (NEEDLE) ×2 IMPLANT
NEEDLE MAYO 6 CRC TAPER PT (NEEDLE) ×2 IMPLANT
NS IRRIG 1000ML POUR BTL (IV SOLUTION) ×2 IMPLANT
PACK CYSTO (CUSTOM PROCEDURE TRAY) ×2 IMPLANT
PLUG CATH AND CAP STER (CATHETERS) ×2 IMPLANT
RETRACTOR STAY HOOK 5MM (MISCELLANEOUS) ×2 IMPLANT
SHEET LAVH (DRAPES) ×2 IMPLANT
SLING SUPRIS RETROPUBIC KIT (Miscellaneous) ×2 IMPLANT
SUT CAPIO ETHIBPND (SUTURE) ×4 IMPLANT
SUT CAPIO POLYGLYCOLIC (SUTURE) IMPLANT
SUT ETHIBOND 0 (SUTURE) IMPLANT
SUT SILK 2 0 SH (SUTURE) IMPLANT
SUT VIC AB 0 CT1 27 (SUTURE) ×1
SUT VIC AB 0 CT1 27XBRD ANTBC (SUTURE) ×1 IMPLANT
SUT VIC AB 2-0 CT1 27 (SUTURE) ×4
SUT VIC AB 2-0 CT1 27XBRD (SUTURE) ×4 IMPLANT
SUT VIC AB 2-0 SH 27 (SUTURE) ×5
SUT VIC AB 2-0 SH 27X BRD (SUTURE) ×5 IMPLANT
SUT VIC AB 3-0 SH 27 (SUTURE) ×2
SUT VIC AB 3-0 SH 27XBRD (SUTURE) ×2 IMPLANT
SUT VIC AB 4-0 PS2 27 (SUTURE) ×2 IMPLANT
SUT VICRYL 0 UR6 27IN ABS (SUTURE) ×4 IMPLANT
SYR 10ML LL (SYRINGE) ×2 IMPLANT
TOWEL NATURAL 10PK STERILE (DISPOSABLE) ×2 IMPLANT
TOWEL OR 17X26 10 PK STRL BLUE (TOWEL DISPOSABLE) ×2 IMPLANT
TOWEL OR NON WOVEN STRL DISP B (DISPOSABLE) ×2 IMPLANT
TUBING CONNECTING 10 (TUBING) ×2 IMPLANT
TUTOPLAST AXIS 6X12 (Tissue) ×2 IMPLANT
WATER STERILE IRR 1000ML POUR (IV SOLUTION) ×2 IMPLANT
YANKAUER SUCT BULB TIP 10FT TU (MISCELLANEOUS) ×2 IMPLANT

## 2017-12-13 NOTE — Op Note (Signed)
Preoperative diagnosis: Cystocele and vault prolapse and mild mixed incontinence Postoperative diagnosis: Cystocele and vault prolapse and mild mixed urinary incontinence Surgery: Cystocele repair and graft; vault suspension; sling cystourethropexy and cystoscopy Surgeon: Dr. Nicki Reaper Mcdiarmid Assistant: Debbrah Alar  The assistant was present and necessary for all steps of the operation described. The assistant played a critical role assisting during the operation  The patient has the above diagnoses and consented to the above procedure.  Extra care was taken with leg positioning to minimize the risk of compartment syndrome and neuropathy and deep vein thrombosis.  She had a moderate size grade 3 cystocele that reached the introitus associated with a moderate central defect.  Her vaginal cuff descended approximately 5 cm from 8 or 9 cm.  She did not of her rectocele.  She had a mildly shortened urethra  I placed a 3-0 Vicryl at the vaginal cuff after carefully marking the cuff.  I instilled 20 cc of lidocaine epinephrine mixture.  With my Allis clamp technique I made a T-shaped anterior vaginal wall incision and mobilized the underlying pubocervical fascia to the white line bilaterally.  I mobilized very well at the apex.  In spite of being in  what I felt was an excellent plane throughout the case multiple times I would have to fulgurate a small bleeder more so on the bladder side or pubocervical fascial side.  I was a little bit surprised that in spite of going to the white line bilaterally she really did not have a large central defect and mainly had a trapdoor deformity.  Keeping the anatomy intact I did a 2 layer anterior repair and not imbricating the bladder neck.  I kept very good length.  I was happy with the anterior repair  I then cystoscoped the patient after taking down retraction.  She had excellent efflux bilaterally at the ureteral orifices.  She cystoscopically had good reduction of the  cystocele.  There was no stitch in the bladder  I was careful to then finger dissect along the appropriate plane to the ischial spine bilaterally.  I did a lot of finger dissection at the level of the spine to sweep tissues medially.  I was very happy feeling the sacrospinous ligament bilaterally.  She had a little bit of a narrow entry point but it was adequate.  Utilizing a Capio device I placed 0 Ethibond 1 full fingerbreadth medial to the spine in a straight line between the spines bilaterally.  I triple check this.  I did a rectal examination and there is no rectal injury.  No suture in the rectum.  I was very pleased with the placement of the sutures.  Utilizing my usual technique I placed a 0 Vicryl suture at the level of the urethrovesical angle into the pelvic sidewall recognizing the sling would be later introduced  I shaped a 10 x 6 cm fascial graft in the shape of the trapezoid.  It was sewn in tension free between the 4 sutures placed.  An appropriate amount of anterior vaginal wall was removed.  I closed the anterior vaginal wall with running 2-0 Vicryl on a CT1 needle.  There was excellent reduction of the prolapse.  There was excellent length.  There is no vaginal narrowing.  Blood loss was less than 100 mL for the prolapse repair  I carefully marked out approximately 2 cm suburethral incision which was most of the length of the urethra but not extending to the meatus or to my cystocele incision.  I instilled approximately 3 cc of lidocaine epinephrine mixture and made an appropriate depth incision.  I did my dissection exposing the urethrovesical angle bilaterally in the usual fashion with scissors and with my finger.  I made 2 1 cm incisions 1.5 cm lateral to the midline just cephalad to the symphysis pubis.  With the bladder emptied I passed the trocar on top level on the back the symphysis pubis keepong lateral with my box technique.  It was delivered to the pulp of the index finger  bilaterally.  I cystoscoped the patient.  With movement of the trocar there was no trocar in the bladder.  There was no movement of the bladder with movement of the trocar.  There was excellent bilateral efflux.There was no sling in the urethra  I tensioned the sling over the fat part of a moderate size Kelly clamp.  I was happy with the hypermobility of the sling.  There was no spring back effect.  I was very pleased with its position.  I closed the anterior vaginal wall with running 2-0 Vicryl on a CT1 needle followed by 2 interrupted sutures.  I cut the mesh below the skin level at the symphysis pubis level  I closed the 2 incisions with one interrupted 4-0 Vicryl and Dermabond  2 vaginal packs with estrogen cream was applied.  Excellent leg position.  Blood loss totally less than 100 mL.  Hopefully the operation will reach the patient's treatment goal.  If anything the sling was tied a few millimeters on the looser side but within my usual standards.

## 2017-12-13 NOTE — Interval H&P Note (Signed)
History and Physical Interval Note:  12/13/2017 7:08 AM  Paula Randolph  has presented today for surgery, with the diagnosis of CYSTOCELE VAULT PROLAPSE STRESS INCONTINENCE  The various methods of treatment have been discussed with the patient and family. After consideration of risks, benefits and other options for treatment, the patient has consented to  Procedure(s): ANTERIOR REPAIR (CYSTOCELE) (N/A) VAGINAL VAULT SUSPENSION GRAFT (N/A) CYSTOSCOPY PUBO-VAGINAL SLING (N/A) as a surgical intervention .  The patient's history has been reviewed, patient examined, no change in status, stable for surgery.  I have reviewed the patient's chart and labs.  Questions were answered to the patient's satisfaction.     Sharone Picchi A

## 2017-12-13 NOTE — Progress Notes (Signed)
,  Post-op note  Subjective: The patient is doing well.  No complaints. Tolerating clears  Minimal pain   Objective: Vital signs in last 24 hours: Temp:  [97.7 F (36.5 C)-98.3 F (36.8 C)] 98.1 F (36.7 C) (12/18 1259) Pulse Rate:  [57-94] 58 (12/18 1245) Resp:  [12-16] 14 (12/18 1259) BP: (118-176)/(70-89) 136/72 (12/18 1259) SpO2:  [100 %] 100 % (12/18 1259) Weight:  [66.7 kg (147 lb)] 66.7 kg (147 lb) (12/18 0542)  Intake/Output from previous day: No intake/output data recorded. Intake/Output this shift: Total I/O In: 1883.8 [P.O.:120; I.V.:1763.8] Out: 75 [Blood:75]  Physical Exam:  General: Alert and oriented. Abdomen: Soft, Nondistended Incisions: Clean and dry. GU: foley catheter to drainage, draining clear  Lab Results: No results for input(s): HGB, HCT in the last 72 hours.  Assessment/Plan: POD#0 s/p anterior repair, vault suspension and sling   1) Continue to monitor   Alla Feeling, MD, MD   LOS: 0 days   Alla Feeling, MD 12/13/2017, 4:00 PM

## 2017-12-13 NOTE — Anesthesia Postprocedure Evaluation (Signed)
Anesthesia Post Note  Patient: Paula Randolph  Procedure(s) Performed: ANTERIOR REPAIR (CYSTOCELE) (N/A ) VAGINAL VAULT SUSPENSION GRAFT (N/A ) CYSTOSCOPY PUBO-VAGINAL SLING (N/A )     Patient location during evaluation: PACU Anesthesia Type: General Level of consciousness: awake Pain management: satisfactory to patient Vital Signs Assessment: post-procedure vital signs reviewed and stable Respiratory status: spontaneous breathing Cardiovascular status: stable Postop Assessment: no apparent nausea or vomiting Anesthetic complications: no    Last Vitals:  Vitals:   12/13/17 1145 12/13/17 1200  BP: 132/70 129/73  Pulse: 63 69  Resp: 14 12  Temp:    SpO2: 100% 100%    Last Pain:  Vitals:   12/13/17 1145  TempSrc:   PainSc: 4    Pain Goal: Patients Stated Pain Goal: 3 (12/13/17 0542)               Giannis Corpuz JR,JOHN Mateo Flow

## 2017-12-13 NOTE — Anesthesia Procedure Notes (Signed)
Procedure Name: Intubation Date/Time: 12/13/2017 7:39 AM Performed by: D'Arcy Abraha D, CRNA Pre-anesthesia Checklist: Patient identified, Emergency Drugs available, Suction available and Patient being monitored Patient Re-evaluated:Patient Re-evaluated prior to induction Oxygen Delivery Method: Circle system utilized Preoxygenation: Pre-oxygenation with 100% oxygen Induction Type: IV induction Ventilation: Mask ventilation without difficulty Laryngoscope Size: Mac and 3 Grade View: Grade I Tube type: Oral Tube size: 7.5 mm Number of attempts: 1 Airway Equipment and Method: Stylet Placement Confirmation: ETT inserted through vocal cords under direct vision,  positive ETCO2 and breath sounds checked- equal and bilateral Secured at: 21 cm Tube secured with: Tape Dental Injury: Teeth and Oropharynx as per pre-operative assessment

## 2017-12-13 NOTE — Transfer of Care (Signed)
Immediate Anesthesia Transfer of Care Note  Patient: Paula Randolph  Procedure(s) Performed: ANTERIOR REPAIR (CYSTOCELE) (N/A ) VAGINAL VAULT SUSPENSION GRAFT (N/A ) CYSTOSCOPY PUBO-VAGINAL SLING (N/A )  Patient Location: PACU  Anesthesia Type:General  Level of Consciousness: awake, alert  and oriented  Airway & Oxygen Therapy: Patient Spontanous Breathing and Patient connected to face mask oxygen  Post-op Assessment: Report given to RN and Post -op Vital signs reviewed and stable  Post vital signs: Reviewed and stable  Last Vitals:  Vitals:   12/13/17 0525 12/13/17 0526  BP:  (!) 176/89  Pulse: 68   Resp: 16   Temp: 36.5 C   SpO2: 100%     Last Pain:  Vitals:   12/13/17 0525  TempSrc: Oral      Patients Stated Pain Goal: 3 (92/78/00 4471)  Complications: No apparent anesthesia complications

## 2017-12-14 ENCOUNTER — Encounter (HOSPITAL_COMMUNITY): Payer: Self-pay | Admitting: Urology

## 2017-12-14 DIAGNOSIS — Z23 Encounter for immunization: Secondary | ICD-10-CM | POA: Diagnosis not present

## 2017-12-14 DIAGNOSIS — Z853 Personal history of malignant neoplasm of breast: Secondary | ICD-10-CM | POA: Diagnosis not present

## 2017-12-14 DIAGNOSIS — N8110 Cystocele, unspecified: Secondary | ICD-10-CM | POA: Diagnosis not present

## 2017-12-14 DIAGNOSIS — Z79899 Other long term (current) drug therapy: Secondary | ICD-10-CM | POA: Diagnosis not present

## 2017-12-14 DIAGNOSIS — I10 Essential (primary) hypertension: Secondary | ICD-10-CM | POA: Diagnosis not present

## 2017-12-14 DIAGNOSIS — Z7982 Long term (current) use of aspirin: Secondary | ICD-10-CM | POA: Diagnosis not present

## 2017-12-14 DIAGNOSIS — N3946 Mixed incontinence: Secondary | ICD-10-CM | POA: Diagnosis not present

## 2017-12-14 LAB — BASIC METABOLIC PANEL
Anion gap: 5 (ref 5–15)
BUN: 14 mg/dL (ref 6–20)
CALCIUM: 8.6 mg/dL — AB (ref 8.9–10.3)
CHLORIDE: 104 mmol/L (ref 101–111)
CO2: 28 mmol/L (ref 22–32)
CREATININE: 0.68 mg/dL (ref 0.44–1.00)
GFR calc Af Amer: >60 mL/min (ref >60)
GFR calc non Af Amer: >60 mL/min (ref >60)
Glucose, Bld: 114 mg/dL — ABNORMAL HIGH (ref 65–99)
Potassium: 4.4 mmol/L (ref 3.5–5.1)
SODIUM: 137 mmol/L (ref 135–145)

## 2017-12-14 LAB — HEMOGLOBIN AND HEMATOCRIT, BLOOD
HCT: 30.4 % — ABNORMAL LOW (ref 36.0–46.0)
HEMOGLOBIN: 9.5 g/dL — AB (ref 12.0–15.0)

## 2017-12-14 NOTE — Progress Notes (Signed)
No pain Vitals OK Labs OK Detailed surgery and post op

## 2017-12-14 NOTE — Discharge Summary (Signed)
Date of admission: 12/13/2017  Date of discharge: 12/14/2017  Admission diagnosis: cystocele  Discharge diagnosis: Cystocele  Secondary diagnoses: vault prolapse and stress incontinence  History and Physical: For full details, please see admission history and physical. Briefly, Paula Randolph is a 56 y.o. year old patient with the above diagnosis.   Hospital Course: Prolapse surgery and ost op course exxcellent  Laboratory values:  Recent Labs    12/13/17 1554 12/14/17 0413  HGB 10.3* 9.5*  HCT 33.4* 30.4*   Recent Labs    12/14/17 0413  CREATININE 0.68    Disposition: Home  Discharge instruction: The patient was instructed to be ambulatory but told to refrain from heavy lifting, strenuous activity, or driving. Detailed post op  Discharge medications:  Allergies as of 12/14/2017   No Known Allergies     Medication List    STOP taking these medications   aspirin EC 81 MG tablet   calcium gluconate 500 MG tablet   cholecalciferol 1000 units tablet Commonly known as:  VITAMIN D   multivitamin with minerals tablet     TAKE these medications   amLODipine 5 MG tablet Commonly known as:  NORVASC TAKE 1 TABLET BY MOUTH ONCE DAILY   ciprofloxacin 250 MG tablet Commonly known as:  CIPRO Take 1 tablet (250 mg total) by mouth 2 (two) times daily.   HYDROcodone-acetaminophen 5-325 MG tablet Commonly known as:  NORCO Take 1-2 tablets by mouth every 6 (six) hours as needed for moderate pain or severe pain.   triamterene-hydrochlorothiazide 75-50 MG tablet Commonly known as:  MAXZIDE Take 1 tablet by mouth daily.   UNABLE TO FIND Masectomy Bras and prosthesis x 6 months Dx:D05.11       Followup:  Follow-up Information    Teira Arcilla, Nicki Reaper, MD Follow up.   Specialty:  Urology Why:  office will call you with date and time of appt.  Contact information: [place] [place] [address] [phone]

## 2017-12-14 NOTE — Progress Notes (Signed)
Foley removed at 0930. Per MD verbal order check PVR x2. Patient voided 100cc with 250cc PVR then 100cc with 175cc PVR. MD made aware. Confirmed that patient could be discharged with instruction to check PVR at home.

## 2017-12-14 NOTE — Discharge Instructions (Signed)
I have reviewed discharge instructions in detail with the patient. They will follow-up with me or their physician as scheduled. My nurse will also be calling the patients as per protocol. As discussed with Dr. Courtney Fenlon. ° °You may resume aspirin, advil, aleve, vitamins, and supplements 7 days after surgery. °

## 2017-12-15 ENCOUNTER — Telehealth: Payer: Self-pay | Admitting: Family Medicine

## 2017-12-15 NOTE — Telephone Encounter (Signed)
Called patient for transition of care call. Unable to leave message, voicemail full. Call must be completed by 12/21

## 2017-12-15 NOTE — Telephone Encounter (Signed)
Called patient regarding message below. No answer, unable to leave message.  

## 2017-12-16 NOTE — Telephone Encounter (Signed)
Called patient regarding message below. No answer, unable to leave message.  

## 2017-12-26 ENCOUNTER — Ambulatory Visit (INDEPENDENT_AMBULATORY_CARE_PROVIDER_SITE_OTHER): Payer: BLUE CROSS/BLUE SHIELD | Admitting: Family Medicine

## 2017-12-26 ENCOUNTER — Encounter: Payer: Self-pay | Admitting: Family Medicine

## 2017-12-26 VITALS — BP 122/80 | HR 102 | Resp 16 | Ht 64.0 in | Wt 146.0 lb

## 2017-12-26 DIAGNOSIS — D539 Nutritional anemia, unspecified: Secondary | ICD-10-CM

## 2017-12-26 DIAGNOSIS — E559 Vitamin D deficiency, unspecified: Secondary | ICD-10-CM

## 2017-12-26 DIAGNOSIS — I10 Essential (primary) hypertension: Secondary | ICD-10-CM | POA: Diagnosis not present

## 2017-12-26 DIAGNOSIS — N8110 Cystocele, unspecified: Secondary | ICD-10-CM | POA: Diagnosis not present

## 2017-12-26 DIAGNOSIS — E785 Hyperlipidemia, unspecified: Secondary | ICD-10-CM | POA: Diagnosis not present

## 2017-12-26 DIAGNOSIS — Z09 Encounter for follow-up examination after completed treatment for conditions other than malignant neoplasm: Secondary | ICD-10-CM

## 2017-12-26 NOTE — Patient Instructions (Signed)
Physical exam end January, call if you need me before  Start once daily OTC iron 325 mg   CBC and iron  And ferritin, fasting lipid, chem 7 , tTH and vit D 1 weeks before  Next visit please  .Marland KitchenMarland KitchenThank you  for choosing [facility]. We consider it a privelige to serve you.  Delivering excellent health care in a caring and  compassionate way is our goal.  Partnering with you,  so that together we can achieve this goal is our strategy.

## 2017-12-26 NOTE — Progress Notes (Signed)
   Paula Randolph     MRN: [id number]      DOB: 08/17/1961   HPI Paula Randolph is here for follow up and re-evaluation following cystocele repair on 12/13/2017 at [facility] long, states feels much better, denies pelvic pressure and has improved control of urine. No fever or chills and the procedure waas overnight stay at [facility], no new medication F/U with urology is Jan 13, 2018 Denies recent fever or chills. Denies sinus pressure, nasal congestion, ear pain or sore throat. Denies chest congestion, productive cough or wheezing. Denies chest pains, palpitations and leg swelling Denies abdominal pain, nausea, vomiting,diarrhea or constipation.   . Denies joint pain, swelling and limitation in mobility. Denies headaches, seizures, numbness, or tingling. Denies depression, anxiety or insomnia. Denies skin break down or rash.   PE  BP 122/80   Pulse (!) 102   Resp 16   Ht 5\' 4"  (1.626 m)   Wt 146 lb (66.2 kg)   SpO2 98%   BMI 25.06 kg/m   Patient alert and oriented and in no cardiopulmonary distress.  HEENT: No facial asymmetry, EOMI,   oropharynx pink and moist.  Neck supple no JVD, no mass.  Chest: Clear to auscultation bilaterally.  CVS: S1, S2 no murmurs, no S3.Regular rate.  ABD: Soft non tender.   Ext: No edema  MS: Adequate ROM spine, shoulders, hips and knees.  Skin: Intact, no ulcerations or rash noted.  Psych: Good eye contact, normal affect. Memory intact not anxious or depressed appearing.  CNS: CN 2-12 intact, power,  normal throughout.no focal deficits noted.   Assessment & Plan  Essential hypertension Controlled, no change in medication DASH diet and commitment to daily physical activity for a minimum of 30 minutes discussed and encouraged, as a part of hypertension management. The importance of attaining a healthy weight is also discussed.  BP/Weight 12/26/2017 12/14/2017 12/13/2017 12/09/2017 06/01/2017 03/30/2017 08/24/5620  Systolic BP 308 657 - 846  962 952 841  Diastolic BP 80 58 - 76 78 92 92  Wt. (Lbs) 146 - 147 147 153 157 157.9  BMI 25.06 - 24.46 24.46 26.26 26.95 27.1       Hospital discharge follow-up Hospital course reviewed with patient and this was uncomplicated , following successful repair of chylocele. She is currently on new medications and feels symptomatically much improved following surgery. Anticipates 8 to 12 weeks away from her job She is to f/u with her surgeon as planned

## 2017-12-27 ENCOUNTER — Encounter: Payer: Self-pay | Admitting: Family Medicine

## 2017-12-27 DIAGNOSIS — Z09 Encounter for follow-up examination after completed treatment for conditions other than malignant neoplasm: Secondary | ICD-10-CM | POA: Insufficient documentation

## 2017-12-27 NOTE — Assessment & Plan Note (Signed)
Controlled, no change in medication DASH diet and commitment to daily physical activity for a minimum of 30 minutes discussed and encouraged, as a part of hypertension management. The importance of attaining a healthy weight is also discussed.  BP/Weight 12/26/2017 12/14/2017 12/13/2017 12/09/2017 06/01/2017 03/30/2017 2/44/9753  Systolic BP 005 110 - 211 173 567 014  Diastolic BP 80 58 - 76 78 92 92  Wt. (Lbs) 146 - 147 147 153 157 157.9  BMI 25.06 - 24.46 24.46 26.26 26.95 27.1

## 2017-12-27 NOTE — Assessment & Plan Note (Signed)
Hospital course reviewed with patient and this was uncomplicated , following successful repair of chylocele. She is currently on new medications and feels symptomatically much improved following surgery. Anticipates 8 to 12 weeks away from her job She is to f/u with her surgeon as planned

## 2018-01-09 ENCOUNTER — Other Ambulatory Visit: Payer: Self-pay | Admitting: Family Medicine

## 2018-01-13 DIAGNOSIS — R350 Frequency of micturition: Secondary | ICD-10-CM | POA: Diagnosis not present

## 2018-01-18 DIAGNOSIS — D539 Nutritional anemia, unspecified: Secondary | ICD-10-CM | POA: Diagnosis not present

## 2018-01-18 DIAGNOSIS — R7301 Impaired fasting glucose: Secondary | ICD-10-CM | POA: Diagnosis not present

## 2018-01-18 DIAGNOSIS — E785 Hyperlipidemia, unspecified: Secondary | ICD-10-CM | POA: Diagnosis not present

## 2018-01-18 DIAGNOSIS — I10 Essential (primary) hypertension: Secondary | ICD-10-CM | POA: Diagnosis not present

## 2018-01-20 LAB — FERRITIN: Ferritin: 71 ng/mL (ref 10–232)

## 2018-01-20 LAB — CBC
HCT: 36.5 % (ref 35.0–45.0)
HEMOGLOBIN: 11.5 g/dL — AB (ref 11.7–15.5)
MCH: 26.3 pg — ABNORMAL LOW (ref 27.0–33.0)
MCHC: 31.5 g/dL — AB (ref 32.0–36.0)
MCV: 83.3 fL (ref 80.0–100.0)
MPV: 10.9 fL (ref 7.5–12.5)
Platelets: 381 10*3/uL (ref 140–400)
RBC: 4.38 10*6/uL (ref 3.80–5.10)
RDW: 11.2 % (ref 11.0–15.0)
WBC: 4.9 10*3/uL (ref 3.8–10.8)

## 2018-01-20 LAB — BASIC METABOLIC PANEL
BUN: 14 mg/dL (ref 7–25)
CALCIUM: 9.9 mg/dL (ref 8.6–10.4)
CO2: 30 mmol/L (ref 20–32)
Chloride: 104 mmol/L (ref 98–110)
Creat: 0.61 mg/dL (ref 0.50–1.05)
Glucose, Bld: 87 mg/dL (ref 65–99)
POTASSIUM: 4.2 mmol/L (ref 3.5–5.3)
SODIUM: 141 mmol/L (ref 135–146)

## 2018-01-20 LAB — IRON: IRON: 113 ug/dL (ref 45–160)

## 2018-01-20 LAB — VITAMIN D 25 HYDROXY (VIT D DEFICIENCY, FRACTURES): Vit D, 25-Hydroxy: 28 ng/mL — ABNORMAL LOW (ref 30–100)

## 2018-01-20 LAB — LIPID PANEL
CHOLESTEROL: 225 mg/dL — AB (ref <200)
HDL: 93 mg/dL (ref >50)
LDL CHOLESTEROL (CALC): 117 mg/dL — AB
Non-HDL Cholesterol (Calc): 132 mg/dL (calc) — ABNORMAL HIGH (ref <130)
TRIGLYCERIDES: 60 mg/dL (ref <150)
Total CHOL/HDL Ratio: 2.4 (calc) (ref <5.0)

## 2018-01-20 LAB — HEMOGLOBIN A1C W/OUT EAG: HEMOGLOBIN A1C: 4.7 %{Hb} (ref <5.7)

## 2018-01-20 LAB — TSH: TSH: 0.86 m[IU]/L (ref 0.40–4.50)

## 2018-01-25 ENCOUNTER — Telehealth: Payer: Self-pay | Admitting: Family Medicine

## 2018-01-25 NOTE — Telephone Encounter (Signed)
Patient left message on nurse line regarding a CPE she has coming up. States she just had surgery and is still sore, wants to know if she is needing to come in.

## 2018-01-25 NOTE — Telephone Encounter (Signed)
I spoke with the pt earlier and schd her appt for 03-06-18 @ 2:20 for a CPE, that  Should allow time for healing, her last CPE was 08-16-16.. thanks

## 2018-01-25 NOTE — Telephone Encounter (Signed)
Can be rescheduled to 8/24 or after, needs a pap this  and that  Is when it is due, so please reschedule

## 2018-01-26 ENCOUNTER — Encounter: Payer: BLUE CROSS/BLUE SHIELD | Admitting: Family Medicine

## 2018-03-06 ENCOUNTER — Encounter: Payer: BLUE CROSS/BLUE SHIELD | Admitting: Family Medicine

## 2018-03-08 DIAGNOSIS — N3946 Mixed incontinence: Secondary | ICD-10-CM | POA: Diagnosis not present

## 2018-03-08 DIAGNOSIS — N8111 Cystocele, midline: Secondary | ICD-10-CM | POA: Diagnosis not present

## 2018-03-12 NOTE — Progress Notes (Deleted)
[place]  [phone] Fax:(336) [phone]  ID: Paula Randolph OB: March 24, 1961  MR#: [id number]  NID#:[id number]  Patient Care Team: Fayrene Helper, MD as PCP - General Grayland Ormond Kathlene November, MD as Consulting Physician (Oncology)  CHIEF COMPLAINT: Right breast DCIS.  INTERVAL HISTORY: Patient returns to clinic today for routine yearly followup. She continues to feel well and remains asymptomatic. She denies any neurologic complaints. She has no bony pain. She denies any fevers, chills, night sweats or weight loss. She denies any chest pain or shortness of breath. She has a good appetite and denies any nausea, vomiting, constipation, or diarrhea. She has no urinary complaint. Patient states she feels at her baseline today and offers no specific complaints today.  REVIEW OF SYSTEMS:   Review of Systems  Constitutional: Negative.  Negative for fever, malaise/fatigue and weight loss.  Respiratory: Negative.  Negative for cough and sputum production.   Cardiovascular: Negative.  Negative for chest pain and leg swelling.  Gastrointestinal: Negative.  Negative for abdominal pain.  Genitourinary: Negative.   Musculoskeletal: Negative.   Neurological: Negative.  Negative for sensory change and weakness.  Psychiatric/Behavioral: Negative.  The patient is not nervous/anxious.     As per HPI. Otherwise, a complete review of systems is negative.  PAST MEDICAL HISTORY: Past Medical History:  Diagnosis Date  . Allergy   . Breast cancer ([place]) 2008   RT LUMPECTOMY  . Cancer ([place])    right breast   . Dyslipidemia   . Hypertension   . Overweight(278.02)   . Primary osteoarthritis of both knees   . Radiation 2008   RT BREAST CA  . Right arm pain   . Strain of lumbar spine   . Trochanteric bursitis   . Vitamin D deficiency     PAST SURGICAL HISTORY: Past Surgical History:  Procedure Laterality Date  . ABDOMINAL HYSTERECTOMY     fibroids  .  APPENDECTOMY    . BREAST SURGERY Right 2008   partial  . COLONOSCOPY N/A 05/10/2014   Procedure: COLONOSCOPY;  Surgeon: Daneil Dolin, MD;  [facility];  Service: Endoscopy;  Laterality: N/A;  8:30 AM  . CYSTOCELE REPAIR N/A 12/13/2017   Procedure: ANTERIOR REPAIR (CYSTOCELE);  Surgeon: Bjorn Loser, MD;  Location: [facility] ORS;  Service: Urology;  Laterality: N/A;  . MASTECTOMY    . PUBOVAGINAL SLING N/A 12/13/2017   Procedure: CYSTOSCOPY Gaynelle Arabian;  Surgeon: Bjorn Loser, MD;  Location: [facility];  Service: Urology;  Laterality: N/A;  . right breast     . VAGINAL PROLAPSE REPAIR N/A 12/13/2017   Procedure: VAGINAL VAULT SUSPENSION GRAFT;  Surgeon: Bjorn Loser, MD;  Location: [facility];  Service: Urology;  Laterality: N/A;    FAMILY HISTORY Family History  Problem Relation Age of Onset  . Cancer Mother        breast   . Breast cancer Mother 6  . Diabetes Father   . Diabetes Brother   . Diabetes Sister        ADVANCED DIRECTIVES:    HEALTH MAINTENANCE: Social History   Tobacco Use  . Smoking status: Never Smoker  . Smokeless tobacco: Never Used  Substance Use Topics  . Alcohol use: No  . Drug use: No     Colonoscopy:  PAP:  Bone density:  Lipid panel:  No Known Allergies  Current Outpatient Medications  Medication Sig Dispense Refill  . amLODipine (NORVASC) 5 MG tablet TAKE 1 TABLET BY MOUTH ONCE DAILY 30  tablet 3  . triamterene-hydrochlorothiazide (MAXZIDE) 75-50 MG tablet TAKE ONE TABLET BY MOUTH ONCE DAILY (  DOSE  INCREASE) 30 tablet 3  . UNABLE TO FIND Masectomy Bras and prosthesis x 6 months Dx:D05.11 1 each prn   No current facility-administered medications for this visit.     OBJECTIVE: There were no vitals filed for this visit.   There is no height or weight on file to calculate BMI.    ECOG FS:0 - Asymptomatic  General: Well-developed, well-nourished, no acute distress. Eyes: Pink conjunctiva, anicteric  sclera. Breasts: Bilateral breast and axilla without lumps or masses. Lungs: Clear to auscultation bilaterally. Heart: Regular rate and rhythm. No rubs, murmurs, or gallops. Abdomen: Soft, nontender, nondistended. No organomegaly noted, normoactive bowel sounds. Musculoskeletal: No edema, cyanosis, or clubbing. Neuro: Alert, answering all questions appropriately. Cranial nerves grossly intact. Skin: No rashes or petechiae noted. Psych: Normal affect.   LAB RESULTS:  Lab Results  Component Value Date   NA 141 01/18/2018   K 4.2 01/18/2018   CL 104 01/18/2018   CO2 30 01/18/2018   GLUCOSE 87 01/18/2018   BUN 14 01/18/2018   CREATININE 0.61 01/18/2018   CALCIUM 9.9 01/18/2018   PROT 6.5 06/05/2008   ALBUMIN 3.6 06/05/2008   AST 17 06/05/2008   ALT 10 06/05/2008   ALKPHOS 39 06/05/2008   BILITOT 0.6 06/05/2008   GFRNONAA >60 12/14/2017   GFRAA >60 12/14/2017    Lab Results  Component Value Date   WBC 4.9 01/18/2018   NEUTROABS 4.6 03/15/2014   HGB 11.5 (L) 01/18/2018   HCT 36.5 01/18/2018   MCV 83.3 01/18/2018   PLT 381 01/18/2018     STUDIES: No results found.  ASSESSMENT: Right breast DCIS.  PLAN:    1. Right breast DCIS: No evidence of disease.  Patient has now completed 5 years of adjuvant hormonal therapy.  No further intervention is needed at this time.  Mammogram on March 11, 2017 reviewed independently and was reported as BI-RADS 1. Repeat in March 2018. Discussed again with patient possible discharged from clinic, but she would prefer extended follow-up at this time.  Return to clinic in one year for routine evaluation, although patient agreed this will likely be her last appointment.   2.  Anemia:  Continue with Tandem supplementation.  Monitor. 3. Genetic testing:  Previously, BCRA testing revealed a negative BCRA 1 mutation, BCRA 2 sequencing did reveal a genetic variant but the significance of this is unknown.   Patient expressed understanding and was  in agreement with this plan. She also understands that She can call clinic at any time with any questions, concerns, or complaints.    Lloyd Huger, MD   03/12/2018 10:11 AM

## 2018-03-13 ENCOUNTER — Ambulatory Visit
Admission: RE | Admit: 2018-03-13 | Discharge: 2018-03-13 | Disposition: A | Discharge status: Home / Self Care | Payer: BLUE CROSS/BLUE SHIELD | Source: Ambulatory Visit | Attending: Oncology | Admitting: Oncology

## 2018-03-13 DIAGNOSIS — Z1231 Encounter for screening mammogram for malignant neoplasm of breast: Secondary | ICD-10-CM | POA: Insufficient documentation

## 2018-03-13 DIAGNOSIS — D0511 Intraductal carcinoma in situ of right breast: Secondary | ICD-10-CM

## 2018-03-13 HISTORY — DX: Personal history of irradiation: Z92.3

## 2018-03-15 ENCOUNTER — Ambulatory Visit: Payer: BLUE CROSS/BLUE SHIELD | Admitting: Oncology

## 2018-03-19 NOTE — Progress Notes (Signed)
Paula Randolph  Telephone:(336) [phone] Fax:(336) [phone]  ID: Josph Macho OB: [birth year]  MR#: [id number]  ZJI#:[id number]  Patient Care Team: Fayrene Helper, MD as PCP - General Grayland Ormond Kathlene November, MD as Consulting Physician (Oncology)  CHIEF COMPLAINT: Right breast DCIS.  INTERVAL HISTORY: Patient returns to clinic today for routine yearly followup. She continues to feel well and remains asymptomatic. She denies any neurologic complaints. She has no bony pain. She denies any fevers, chills, night sweats or weight loss. She denies any chest pain or shortness of breath. She has a good appetite and denies any nausea, vomiting, constipation, or diarrhea. She has no urinary complaint. Patient offers no specific complaints today.  REVIEW OF SYSTEMS:   Review of Systems  Constitutional: Negative.  Negative for fever, malaise/fatigue and weight loss.  Respiratory: Negative.  Negative for cough and sputum production.   Cardiovascular: Negative.  Negative for chest pain and leg swelling.  Gastrointestinal: Negative.  Negative for abdominal pain, blood in stool and melena.  Genitourinary: Negative.   Musculoskeletal: Negative.  Negative for back pain.  Skin: Negative.  Negative for rash.  Neurological: Negative.  Negative for sensory change, focal weakness and weakness.  Psychiatric/Behavioral: Negative.  The patient is not nervous/anxious.     As per HPI. Otherwise, a complete review of systems is negative.  PAST MEDICAL HISTORY: Past Medical History:  Diagnosis Date  . Allergy   . Breast cancer ([place]) 2008   RT LUMPECTOMY and rad tx for DCIS  . Cancer ([place])    right breast   . Dyslipidemia   . Hypertension   . Overweight(278.02)   . Personal history of radiation therapy 2008   right breast DCIS  . Primary osteoarthritis of both knees   . Radiation 2008   RT BREAST CA  . Right arm pain   . Strain of lumbar spine   . Trochanteric bursitis   . Vitamin D  deficiency     PAST SURGICAL HISTORY: Past Surgical History:  Procedure Laterality Date  . ABDOMINAL HYSTERECTOMY     fibroids  . APPENDECTOMY    . BREAST LUMPECTOMY Right 2008   lumpectomy of calcifications in UOQ. DCIS  . BREAST SURGERY Right 2008   partial  . COLONOSCOPY N/A 05/10/2014   Procedure: COLONOSCOPY;  Surgeon: Daneil Dolin, MD;  [facility];  Service: Endoscopy;  Laterality: N/A;  8:30 AM  . CYSTOCELE REPAIR N/A 12/13/2017   Procedure: ANTERIOR REPAIR (CYSTOCELE);  Surgeon: Bjorn Loser, MD;  [facility];  Service: Urology;  Laterality: N/A;  . PUBOVAGINAL SLING N/A 12/13/2017   Procedure: CYSTOSCOPY Gaynelle Arabian;  Surgeon: Bjorn Loser, MD;  Location: [facility];  Service: Urology;  Laterality: N/A;  . right breast     . VAGINAL PROLAPSE REPAIR N/A 12/13/2017   Procedure: VAGINAL VAULT SUSPENSION GRAFT;  Surgeon: Bjorn Loser, MD;  [facility];  Service: Urology;  Laterality: N/A;    FAMILY HISTORY Family History  Problem Relation Age of Onset  . Cancer Mother        breast   . Breast cancer Mother 50  . Diabetes Father   . Diabetes Brother   . Diabetes Sister        ADVANCED DIRECTIVES:    HEALTH MAINTENANCE: Social History   Tobacco Use  . Smoking status: Never Smoker  . Smokeless tobacco: Never Used  Substance Use Topics  . Alcohol use: No  . Drug use: No  Colonoscopy:  PAP:  Bone density:  Lipid panel:  No Known Allergies  Current Outpatient Medications  Medication Sig Dispense Refill  . amLODipine (NORVASC) 5 MG tablet TAKE 1 TABLET BY MOUTH ONCE DAILY 30 tablet 3  . triamterene-hydrochlorothiazide (MAXZIDE) 75-50 MG tablet TAKE ONE TABLET BY MOUTH ONCE DAILY (  DOSE  INCREASE) 30 tablet 3  . UNABLE TO FIND Masectomy Bras and prosthesis x 6 months Dx:D05.11 1 each prn   No current facility-administered medications for this visit.     OBJECTIVE: Vitals:   03/23/18 0848  BP: 133/84    Pulse: 81  Temp: (!) 96.7 F (35.9 C)     Body mass index is 27.24 kg/m.    ECOG FS:0 - Asymptomatic  General: Well-developed, well-nourished, no acute distress. Eyes: Pink conjunctiva, anicteric sclera. Breasts: Bilateral breast and axilla without lumps or masses.  Patient requested exam be deferred today. Lungs: Clear to auscultation bilaterally. Heart: Regular rate and rhythm. No rubs, murmurs, or gallops. Abdomen: Soft, nontender, nondistended. No organomegaly noted, normoactive bowel sounds. Musculoskeletal: No edema, cyanosis, or clubbing. Neuro: Alert, answering all questions appropriately. Cranial nerves grossly intact. Skin: No rashes or petechiae noted. Psych: Normal affect.   LAB RESULTS:  Lab Results  Component Value Date   NA 141 01/18/2018   K 4.2 01/18/2018   CL 104 01/18/2018   CO2 30 01/18/2018   GLUCOSE 87 01/18/2018   BUN 14 01/18/2018   CREATININE 0.61 01/18/2018   CALCIUM 9.9 01/18/2018   PROT 6.5 06/05/2008   ALBUMIN 3.6 06/05/2008   AST 17 06/05/2008   ALT 10 06/05/2008   ALKPHOS 39 06/05/2008   BILITOT 0.6 06/05/2008   GFRNONAA >60 12/14/2017   GFRAA >60 12/14/2017    Lab Results  Component Value Date   WBC 4.9 01/18/2018   NEUTROABS 4.6 03/15/2014   HGB 11.5 (L) 01/18/2018   HCT 36.5 01/18/2018   MCV 83.3 01/18/2018   PLT 381 01/18/2018     STUDIES: Mm Digital Screening Bilateral  Result Date: 03/13/2018 CLINICAL DATA:  Screening. EXAM: DIGITAL SCREENING BILATERAL MAMMOGRAM WITH CAD COMPARISON:  Previous exam(s). ACR Breast Density Category d: The breast tissue is extremely dense, which lowers the sensitivity of mammography. FINDINGS: There are no findings suspicious for malignancy. Images were processed with CAD. IMPRESSION: No mammographic evidence of malignancy. A result letter of this screening mammogram will be mailed directly to the patient. RECOMMENDATION: Screening mammogram in one year. (Code:SM-B-01Y) BI-RADS CATEGORY  1:  Negative. Electronically Signed   By: Nolon Nations M.D.   On: 03/13/2018 12:02    ASSESSMENT: Right breast DCIS.  PLAN:    1. Right breast DCIS: No evidence of disease.  Patient has now completed 5 years of adjuvant hormonal therapy.  No further intervention is needed at this time.  Mammogram on March 13, 2018 reviewed independently and was reported as BI-RADS 1. Repeat in March 2020.  After discussion with the patient, it was agreed upon that no further follow-up is necessary.  Continue yearly mammograms which can be ordered by her PCP.  Please refer patient back if there are any questions or concerns.     2.  Anemia: Mild. Continue with Tandem supplementation.  Monitor. 3. Genetic testing:  Previously, BCRA testing revealed a negative BCRA 1 mutation, BCRA 2 sequencing did reveal a genetic variant but the significance of this is unknown.  Approximately 20 minutes was spent in discussion of which greater than 50% was consultation.   Patient expressed  understanding and was in agreement with this plan. She also understands that She can call clinic at any time with any questions, concerns, or complaints.    Lloyd Huger, MD   03/26/2018 8:49 PM

## 2018-03-23 ENCOUNTER — Other Ambulatory Visit: Payer: Self-pay

## 2018-03-23 ENCOUNTER — Encounter: Payer: Self-pay | Admitting: Oncology

## 2018-03-23 ENCOUNTER — Inpatient Hospital Stay: Payer: BLUE CROSS/BLUE SHIELD | Attending: Oncology | Admitting: Oncology

## 2018-03-23 VITALS — BP 133/84 | HR 81 | Temp 96.7°F | Wt 158.7 lb

## 2018-03-23 DIAGNOSIS — Z79899 Other long term (current) drug therapy: Secondary | ICD-10-CM | POA: Diagnosis not present

## 2018-03-23 DIAGNOSIS — Z833 Family history of diabetes mellitus: Secondary | ICD-10-CM | POA: Insufficient documentation

## 2018-03-23 DIAGNOSIS — D649 Anemia, unspecified: Secondary | ICD-10-CM | POA: Diagnosis not present

## 2018-03-23 DIAGNOSIS — Z923 Personal history of irradiation: Secondary | ICD-10-CM | POA: Insufficient documentation

## 2018-03-23 DIAGNOSIS — I10 Essential (primary) hypertension: Secondary | ICD-10-CM | POA: Insufficient documentation

## 2018-03-23 DIAGNOSIS — D0511 Intraductal carcinoma in situ of right breast: Secondary | ICD-10-CM

## 2018-03-23 DIAGNOSIS — Z08 Encounter for follow-up examination after completed treatment for malignant neoplasm: Secondary | ICD-10-CM | POA: Diagnosis not present

## 2018-03-23 DIAGNOSIS — Z9889 Other specified postprocedural states: Secondary | ICD-10-CM | POA: Insufficient documentation

## 2018-03-23 DIAGNOSIS — Z86000 Personal history of in-situ neoplasm of breast: Secondary | ICD-10-CM

## 2018-03-23 DIAGNOSIS — Z803 Family history of malignant neoplasm of breast: Secondary | ICD-10-CM | POA: Diagnosis not present

## 2018-03-23 DIAGNOSIS — Z853 Personal history of malignant neoplasm of breast: Secondary | ICD-10-CM | POA: Insufficient documentation

## 2018-03-23 DIAGNOSIS — Z90710 Acquired absence of both cervix and uterus: Secondary | ICD-10-CM | POA: Insufficient documentation

## 2018-05-02 ENCOUNTER — Telehealth: Payer: Self-pay | Admitting: Family Medicine

## 2018-05-02 NOTE — Telephone Encounter (Signed)
Called patient to r/s appt, no answer, no voicemail. Mailed letter

## 2018-05-18 ENCOUNTER — Encounter: Payer: BLUE CROSS/BLUE SHIELD | Admitting: Family Medicine

## 2018-05-25 ENCOUNTER — Encounter: Payer: Self-pay | Admitting: Family Medicine

## 2018-05-25 ENCOUNTER — Ambulatory Visit (INDEPENDENT_AMBULATORY_CARE_PROVIDER_SITE_OTHER): Payer: BLUE CROSS/BLUE SHIELD | Admitting: Family Medicine

## 2018-05-25 ENCOUNTER — Other Ambulatory Visit (HOSPITAL_COMMUNITY)
Admission: RE | Admit: 2018-05-25 | Discharge: 2018-05-25 | Disposition: A | Discharge status: Home / Self Care | Payer: BLUE CROSS/BLUE SHIELD | Source: Ambulatory Visit | Attending: Family Medicine | Admitting: Family Medicine

## 2018-05-25 VITALS — BP 122/80 | HR 77 | Resp 16 | Ht 64.0 in | Wt 161.0 lb

## 2018-05-25 DIAGNOSIS — Z124 Encounter for screening for malignant neoplasm of cervix: Secondary | ICD-10-CM | POA: Diagnosis not present

## 2018-05-25 DIAGNOSIS — N3000 Acute cystitis without hematuria: Secondary | ICD-10-CM

## 2018-05-25 DIAGNOSIS — N760 Acute vaginitis: Secondary | ICD-10-CM | POA: Diagnosis not present

## 2018-05-25 DIAGNOSIS — Z0000 Encounter for general adult medical examination without abnormal findings: Secondary | ICD-10-CM | POA: Diagnosis not present

## 2018-05-25 DIAGNOSIS — B9689 Other specified bacterial agents as the cause of diseases classified elsewhere: Secondary | ICD-10-CM | POA: Insufficient documentation

## 2018-05-25 NOTE — Progress Notes (Signed)
    Paula Randolph     MRN: [id number]      DOB: 07-08-1961  HPI: Patient is in for annual physical exam. Malodorous urine x 2 weeks , also c/o increased vaginal d/c with odor , so this is being tested also Recently laid off from her job, so increased stress and anxiety as looking for work  Recent labs, are reviewed. Immunization is reviewed , and  Is up to date   PE: BP 122/80   Pulse 77   Resp 16   Ht 5\' 4"  (1.626 m)   Wt 161 lb (73 kg)   SpO2 99%   BMI 27.64 kg/m   Pleasant  female, alert and oriented x 3, in no cardio-pulmonary distress. Afebrile. HEENT No facial trauma or asymetry. Sinuses non tender.  Extra occullar muscles intact, pupils equally reactive to light. External ears normal, tympanic membranes clear. Oropharynx moist, no exudate. Neck: supple, no adenopathy,JVD or thyromegaly.No bruits.  Chest: Clear to ascultation bilaterally.No crackles or wheezes. Non tender to palpation  Breast:  asymetry, s/p right breast lumpectomy,,no masses or lumps. No tenderness. No nipple discharge or inversion. No axillary or supraclavicular adenopathy  Cardiovascular system; Heart sounds normal,  S1 and  S2 ,no S3.  No murmur, or thrill. Apical beat not displaced Peripheral pulses normal.  Abdomen: Soft, non tender, no organomegaly or masses. No bruits. Bowel sounds normal. No guarding, tenderness or rebound.  Rectal:  Deferred .  GU: External genitalia normal female genitalia , normal female distribution of hair. No lesions. Urethral meatus normal in size, no  Prolapse, no lesions visibly  Present. Bladder non tender. Vagina pink and moist , with no visible lesions ,white  discharge present . Adequate pelvic support no  cystocele or rectocele noted  Uterus absent, no adnexal masses, no  adnexal tenderness.   Musculoskeletal exam: Full ROM of spine, hips , shoulders and knees. No deformity ,swelling or crepitus noted. No muscle wasting or atrophy.    Neurologic: Cranial nerves 2 to 12 intact. Power, tone ,sensation and reflexes normal throughout. No disturbance in gait. No tremor.  Skin: Intact, no ulceration, erythema , scaling or rash noted. Pigmentation normal throughout  Psych; Normal mood and affect. Judgement and concentration normal   Assessment & Plan:  Annual physical exam Annual exam as documented. Counseling done  re healthy lifestyle involving commitment to 150 minutes exercise per week, heart healthy diet, and attaining healthy weight.The importance of adequate sleep also discussed.  Immunization and cancer screening needs are specifically addressed at this visit.   Acute cystitis without hematuria Abnormal UA however since main symptom is , malodor with no fever and not very symptomatic , no antibiotic until c/s available. Is positive for E coli, pt aware and 3 day course of cipro is prescribed  Vaginitis and vulvovaginitis Symptomatic, specimens sent for testing

## 2018-05-25 NOTE — Patient Instructions (Signed)
F/U in 6 month, call if you need  Me sooner  Fasting lipid and chem 7 in 5.5 months  Pleas reduce fried and fatty foods  Please work on good  health habits so that your health will improve. 1. Commitment to daily physical activity for 30 to 60  minutes, if you are able to do this.  2. Commitment to wise food choices. Aim for half of your  food intake to be vegetable and fruit, one quarter starchy foods, and one quarter protein. Try to eat on a regular schedule  3 meals per day, snacking between meals should be limited to vegetables or fruits or small portions of nuts. 64 ounces of water per day is generally recommended, unless you have specific health conditions, like heart failure or kidney failure where you will need to limit fluid intake.  3. Commitment to sufficient and a  good quality of physical and mental rest daily, generally between 6 to 8 hours per day.  WITH PERSISTANCE AND PERSEVERANCE, THE IMPOSSIBLE , BECOMES THE NORM!  Please redice fried and fatty foods  OPap sent , also specimens fior tsting for infection

## 2018-05-27 LAB — URINE CULTURE
MICRO NUMBER:: 90655001
SPECIMEN QUALITY:: ADEQUATE

## 2018-05-28 ENCOUNTER — Encounter: Payer: Self-pay | Admitting: Family Medicine

## 2018-05-28 DIAGNOSIS — N760 Acute vaginitis: Secondary | ICD-10-CM | POA: Insufficient documentation

## 2018-05-28 MED ORDER — CIPROFLOXACIN HCL 500 MG PO TABS: 500.0000 mg | ORAL_TABLET | Freq: Two times a day (BID) | ORAL | 0 refills | Status: DC

## 2018-05-28 NOTE — Assessment & Plan Note (Signed)
Abnormal UA however since main symptom is , malodor with no fever and not very symptomatic , no antibiotic until c/s available. Is positive for E coli, pt aware and 3 day course of cipro is prescribed

## 2018-05-28 NOTE — Assessment & Plan Note (Signed)
Symptomatic, specimens sent for testing

## 2018-05-28 NOTE — Assessment & Plan Note (Signed)
Annual exam as documented. Counseling done  re healthy lifestyle involving commitment to 150 minutes exercise per week, heart healthy diet, and attaining healthy weight.The importance of adequate sleep also discussed.  Immunization and cancer screening needs are specifically addressed at this visit.  

## 2018-05-30 LAB — CYTOLOGY - PAP
Bacterial vaginitis: POSITIVE — AB
CHLAMYDIA, DNA PROBE: NEGATIVE
Candida vaginitis: NEGATIVE
Diagnosis: NEGATIVE
HPV (WINDOPATH): NOT DETECTED
Neisseria Gonorrhea: NEGATIVE
TRICH (WINDOWPATH): NEGATIVE

## 2018-06-05 ENCOUNTER — Other Ambulatory Visit: Payer: Self-pay | Admitting: Family Medicine

## 2018-07-10 ENCOUNTER — Other Ambulatory Visit: Payer: Self-pay | Admitting: Family Medicine

## 2018-10-19 ENCOUNTER — Other Ambulatory Visit: Payer: Self-pay

## 2018-10-20 ENCOUNTER — Other Ambulatory Visit: Payer: Self-pay

## 2018-10-20 MED ORDER — AMLODIPINE BESYLATE 5 MG PO TABS: 5.0000 mg | ORAL_TABLET | Freq: Every day | ORAL | 3 refills | Status: DC

## 2018-10-25 ENCOUNTER — Ambulatory Visit (INDEPENDENT_AMBULATORY_CARE_PROVIDER_SITE_OTHER): Payer: BLUE CROSS/BLUE SHIELD | Admitting: Family Medicine

## 2018-10-25 ENCOUNTER — Encounter: Payer: Self-pay | Admitting: Family Medicine

## 2018-10-25 VITALS — BP 118/80 | HR 71 | Resp 16 | Ht 64.0 in | Wt 166.4 lb

## 2018-10-25 DIAGNOSIS — I10 Essential (primary) hypertension: Secondary | ICD-10-CM | POA: Diagnosis not present

## 2018-10-25 DIAGNOSIS — E663 Overweight: Secondary | ICD-10-CM

## 2018-10-25 DIAGNOSIS — Z1231 Encounter for screening mammogram for malignant neoplasm of breast: Secondary | ICD-10-CM

## 2018-10-25 DIAGNOSIS — E785 Hyperlipidemia, unspecified: Secondary | ICD-10-CM | POA: Diagnosis not present

## 2018-10-25 DIAGNOSIS — Z23 Encounter for immunization: Secondary | ICD-10-CM

## 2018-10-25 DIAGNOSIS — Z1211 Encounter for screening for malignant neoplasm of colon: Secondary | ICD-10-CM

## 2018-10-25 DIAGNOSIS — E559 Vitamin D deficiency, unspecified: Secondary | ICD-10-CM

## 2018-10-25 MED ORDER — AMLODIPINE BESYLATE 5 MG PO TABS: 5.0000 mg | ORAL_TABLET | Freq: Every day | ORAL | 3 refills | Status: DC

## 2018-10-25 NOTE — Progress Notes (Signed)
   Paula Randolph     MRN: [id number]      DOB: 1961/08/04   HPI Paula Randolph is here for follow up and re-evaluation of chronic medical conditions, medication management and review of any available recent lab and radiology data.  Preventive health is updated, specifically  Cancer screening and Immunization.   Questions or concerns regarding consultations or procedures which the PT has had in the interim are  addressed. The PT denies any adverse reactions to current medications since the last visit.  There are no new concerns.  There are no specific complaints   ROS Denies recent fever or chills. Denies sinus pressure, nasal congestion, ear pain or sore throat. Denies chest congestion, productive cough or wheezing. Denies chest pains, palpitations and leg swelling Denies abdominal pain, nausea, vomiting,diarrhea or constipation.   Denies dysuria, frequency, hesitancy or incontinence. Denies joint pain, swelling and limitation in mobility. Denies headaches, seizures, numbness, or tingling. Denies depression, anxiety or insomnia. Denies skin break down or rash.   PE  BP 118/80   Pulse 71   Resp 16   Ht 5\' 4"  (1.626 m)   Wt 166 lb 6.4 oz (75.5 kg)   SpO2 99%   BMI 28.56 kg/m   Patient alert and oriented and in no cardiopulmonary distress.  HEENT: No facial asymmetry, EOMI,   oropharynx pink and moist.  Neck supple no JVD, no mass.  Chest: Clear to auscultation bilaterally.  CVS: S1, S2 no murmurs, no S3.Regular rate.  ABD: Soft non tender.   Ext: No edema  MS: Adequate ROM spine, shoulders, hips and knees.  Skin: Intact, no ulcerations or rash noted.  Psych: Good eye contact, normal affect. Memory intact not anxious or depressed appearing.  CNS: CN 2-12 intact, power,  normal throughout.no focal deficits noted.   Assessment & Plan  Essential hypertension Controlled, no change in medication DASH diet and commitment to daily physical activity for a minimum of 30  minutes discussed and encouraged, as a part of hypertension management. The importance of attaining a healthy weight is also discussed.  BP/Weight 10/25/2018 05/25/2018 03/23/2018 12/26/2017 12/14/2017 12/13/2017 52/77/8242  Systolic BP 353 614 431 540 086 - 761  Diastolic BP 80 80 84 80 58 - 76  Wt. (Lbs) 166.4 161 158.7 146 - 147 147  BMI 28.56 27.64 27.24 25.06 - 24.46 24.46       Overweight (BMI 25.0-29.9) Deteriorated. Patient re-educated about  the importance of commitment to a  minimum of 150 minutes of exercise per week.  The importance of healthy food choices with portion control discussed. Encouraged to start a food diary, count calories and to consider  joining a support group. Sample diet sheets offered. Goals set by the patient for the next several months.   Weight /BMI 10/25/2018 05/25/2018 03/23/2018  WEIGHT 166 lb 6.4 oz 161 lb 158 lb 11.2 oz  HEIGHT 5\' 4"  5\' 4"  -  BMI 28.56 kg/m2 27.64 kg/m2 27.24 kg/m2      Dyslipidemia Hyperlipidemia:Low fat diet discussed and encouraged.   Lipid Panel  Lab Results  Component Value Date   CHOL 225 (H) 01/18/2018   HDL 93 01/18/2018   LDLCALC 117 (H) 01/18/2018   TRIG 60 01/18/2018   CHOLHDL 2.4 01/18/2018   Updated lab needed at/ before next visit.     Need for immunization against influenza After obtaining informed consent, the vaccine is  administered by LPN.

## 2018-10-25 NOTE — Assessment & Plan Note (Signed)
Hyperlipidemia:Low fat diet discussed and encouraged.   Lipid Panel  Lab Results  Component Value Date   CHOL 225 (H) 01/18/2018   HDL 93 01/18/2018   LDLCALC 117 (H) 01/18/2018   TRIG 60 01/18/2018   CHOLHDL 2.4 01/18/2018   Updated lab needed at/ before next visit.

## 2018-10-25 NOTE — Assessment & Plan Note (Signed)
Deteriorated. Patient re-educated about  the importance of commitment to a  minimum of 150 minutes of exercise per week.  The importance of healthy food choices with portion control discussed. Encouraged to start a food diary, count calories and to consider  joining a support group. Sample diet sheets offered. Goals set by the patient for the next several months.   Weight /BMI 10/25/2018 05/25/2018 03/23/2018  WEIGHT 166 lb 6.4 oz 161 lb 158 lb 11.2 oz  HEIGHT 5\' 4"  5\' 4"  -  BMI 28.56 kg/m2 27.64 kg/m2 27.24 kg/m2

## 2018-10-25 NOTE — Patient Instructions (Addendum)
F/U  First week in April, call if you need me before  Pls schedule mammogram at [facility] center in March 20 or after, per pt request at checkout  Cologuard test for colon screening to be arranged today  Collect lab order for fasting lipid, cmp and EGFr, TSH , vit D, CBC, Jan 30 or shortly after ([facility])  Flu vaccine today  Check on Shingles vaccine with your insurance ,  This is recommended and we have it here Medication is to be  refilled

## 2018-10-25 NOTE — Assessment & Plan Note (Signed)
Controlled, no change in medication DASH diet and commitment to daily physical activity for a minimum of 30 minutes discussed and encouraged, as a part of hypertension management. The importance of attaining a healthy weight is also discussed.  BP/Weight 10/25/2018 05/25/2018 03/23/2018 12/26/2017 12/14/2017 12/13/2017 26/37/8588  Systolic BP 502 774 128 786 767 - 209  Diastolic BP 80 80 84 80 58 - 76  Wt. (Lbs) 166.4 161 158.7 146 - 147 147  BMI 28.56 27.64 27.24 25.06 - 24.46 24.46

## 2018-10-25 NOTE — Assessment & Plan Note (Signed)
After obtaining informed consent, the vaccine is  administered by LPN.  

## 2018-11-21 ENCOUNTER — Other Ambulatory Visit: Payer: Self-pay | Admitting: Family Medicine

## 2019-02-09 ENCOUNTER — Encounter (HOSPITAL_COMMUNITY): Payer: Self-pay

## 2019-02-09 ENCOUNTER — Emergency Department (HOSPITAL_COMMUNITY)
Admission: EM | Admit: 2019-02-09 | Discharge: 2019-02-10 | Disposition: A | Discharge status: Home / Self Care | Payer: BLUE CROSS/BLUE SHIELD | Attending: Emergency Medicine | Admitting: Emergency Medicine

## 2019-02-09 DIAGNOSIS — N3001 Acute cystitis with hematuria: Secondary | ICD-10-CM | POA: Insufficient documentation

## 2019-02-09 DIAGNOSIS — Z853 Personal history of malignant neoplasm of breast: Secondary | ICD-10-CM | POA: Diagnosis not present

## 2019-02-09 DIAGNOSIS — Z79899 Other long term (current) drug therapy: Secondary | ICD-10-CM | POA: Diagnosis not present

## 2019-02-09 DIAGNOSIS — R1030 Lower abdominal pain, unspecified: Secondary | ICD-10-CM | POA: Diagnosis not present

## 2019-02-09 DIAGNOSIS — R102 Pelvic and perineal pain: Secondary | ICD-10-CM

## 2019-02-09 DIAGNOSIS — I10 Essential (primary) hypertension: Secondary | ICD-10-CM | POA: Diagnosis not present

## 2019-02-09 LAB — URINALYSIS, ROUTINE W REFLEX MICROSCOPIC
Bacteria, UA: NONE SEEN
Bilirubin Urine: NEGATIVE
GLUCOSE, UA: NEGATIVE mg/dL
Ketones, ur: NEGATIVE mg/dL
Nitrite: NEGATIVE
PROTEIN: NEGATIVE mg/dL
SPECIFIC GRAVITY, URINE: 1.016 (ref 1.005–1.030)
pH: 7 (ref 5.0–8.0)

## 2019-02-09 NOTE — ED Provider Notes (Signed)
[facility] EMERGENCY DEPARTMENT Provider Note   CSN: [id number] Arrival date & time: 02/09/19  2314     History   Chief Complaint Chief Complaint  Patient presents with  . Abdominal Pain    HPI Paula Randolph is a 58 y.o. female.  Patient with history of breast cancer and hypertension presenting with suprapubic and lower abdominal pain and pressure for the past day.  States she is had pain with urination, urgency and frequency.  Small amount of blood in her urine.  Denies any fevers, chills, nausea, vomiting.  No diarrhea or constipation.  No vaginal bleeding or discharge.  Previous hysterectomy and appendectomy.  History of cystocele and vaginal prolapse repair.  Denies any history of urinary tract infection.  Denies any back pain or flank pain.  No chest pain or shortness of breath. History of breast cancer, not currently receiving treatment,.   Abdominal Pain  Associated symptoms: dysuria and hematuria   Associated symptoms: no fever, no nausea, no vaginal discharge and no vomiting     Past Medical History:  Diagnosis Date  . Allergy   . Breast cancer ([place]) 2008   RT LUMPECTOMY and rad tx for DCIS  . Cancer ([place])    right breast   . Dyslipidemia   . Hypertension   . Overweight(278.02)   . Personal history of radiation therapy 2008   right breast DCIS  . Primary osteoarthritis of both knees   . Radiation 2008   RT BREAST CA  . Right arm pain   . Strain of lumbar spine   . Trochanteric bursitis   . Vitamin D deficiency     Patient Active Problem List   Diagnosis Date Noted  . Cystocele, midline 12/13/2017  . Need for immunization against influenza 09/03/2014  . Vitamin D deficiency 02/02/2010  . Overweight (BMI 25.0-29.9) 04/20/2009  . Dyslipidemia 01/30/2009  . Allergic rhinitis 01/30/2009  . Ductal carcinoma in situ (DCIS) of right breast 04/12/2008  . Essential hypertension 01/05/2008    Past Surgical History:  Procedure Laterality Date  . ABDOMINAL  HYSTERECTOMY     fibroids  . APPENDECTOMY    . BREAST LUMPECTOMY Right 2008   lumpectomy of calcifications in UOQ. DCIS  . BREAST SURGERY Right 2008   partial  . COLONOSCOPY N/A 05/10/2014   Procedure: COLONOSCOPY;  Surgeon: Daneil Dolin, MD;  Location: [facility];  Service: Endoscopy;  Laterality: N/A;  8:30 AM  . CYSTOCELE REPAIR N/A 12/13/2017   Procedure: ANTERIOR REPAIR (CYSTOCELE);  Surgeon: Bjorn Loser, MD;  Location: [facility] ORS;  Service: Urology;  Laterality: N/A;  . PUBOVAGINAL SLING N/A 12/13/2017   Procedure: CYSTOSCOPY Gaynelle Arabian;  Surgeon: Bjorn Loser, MD;  Location: [facility] ORS;  Service: Urology;  Laterality: N/A;  . right breast     . VAGINAL PROLAPSE REPAIR N/A 12/13/2017   Procedure: VAGINAL VAULT SUSPENSION GRAFT;  Surgeon: Bjorn Loser, MD;  Location: [facility] ORS;  Service: Urology;  Laterality: N/A;     OB History   No obstetric history on file.      Home Medications    Prior to Admission medications   Medication Sig Start Date End Date Taking? Authorizing Provider  amLODipine (NORVASC) 5 MG tablet Take 1 tablet (5 mg total) by mouth daily. 10/25/18   Fayrene Helper, MD  triamterene-hydrochlorothiazide (MAXZIDE) 75-50 MG tablet TAKE 1 TABLET BY MOUTH ONCE DAILY 11/22/18   Fayrene Helper, MD  UNABLE TO FIND Masectomy [place] and prosthesis x 6 months  Dx:D05.11 06/01/17   Fayrene Helper, MD    Family History Family History  Problem Relation Age of Onset  . Cancer Mother        breast   . Breast cancer Mother 54  . Diabetes Father   . Diabetes Brother   . Diabetes Sister     Social History Social History   Tobacco Use  . Smoking status: Never Smoker  . Smokeless tobacco: Never Used  Substance Use Topics  . Alcohol use: No  . Drug use: No     Allergies   Patient has no known allergies.   Review of Systems Review of Systems  Constitutional: Negative for activity change, appetite change and fever.  Eyes: Negative  for visual disturbance.  Gastrointestinal: Positive for abdominal pain. Negative for nausea and vomiting.  Genitourinary: Positive for dysuria, frequency, hematuria, urgency and vaginal pain. Negative for vaginal discharge.  Musculoskeletal: Negative for arthralgias, back pain and myalgias.  Skin: Negative for rash.  Neurological: Negative for dizziness, weakness and headaches.    all other systems are negative except as noted in the HPI and PMH.    Physical Exam Updated Vital Signs BP (!) 148/86 (BP Location: Left Arm)   Pulse 91   Temp 98.1 F (36.7 C) (Oral)   Resp 15   SpO2 100%   Physical Exam Vitals signs and nursing note reviewed.  Constitutional:      General: She is not in acute distress.    Appearance: She is well-developed.  HENT:     Head: Normocephalic and atraumatic.     Mouth/Throat:     Pharynx: No oropharyngeal exudate.  Eyes:     Conjunctiva/sclera: Conjunctivae normal.     Pupils: Pupils are equal, round, and reactive to light.  Neck:     Musculoskeletal: Normal range of motion and neck supple.     Comments: No meningismus. Cardiovascular:     Rate and Rhythm: Normal rate and regular rhythm.     Heart sounds: Normal heart sounds. No murmur.  Pulmonary:     Effort: Pulmonary effort is normal. No respiratory distress.     Breath sounds: Normal breath sounds.  Abdominal:     Palpations: Abdomen is soft.     Tenderness: There is abdominal tenderness. There is no guarding or rebound.     Comments: Suprapubic tenderness No RLQ tenderness  Musculoskeletal: Normal range of motion.        General: No tenderness.     Comments: No CVAT  Skin:    General: Skin is warm.     Capillary Refill: Capillary refill takes less than 2 seconds.  Neurological:     General: No focal deficit present.     Mental Status: She is alert and oriented to person, place, and time. Mental status is at baseline.     Cranial Nerves: No cranial nerve deficit.     Motor: No abnormal  muscle tone.     Coordination: Coordination normal.     Comments: No ataxia on finger to nose bilaterally. No pronator drift. 5/5 strength throughout. CN 2-12 intact.Equal grip strength. Sensation intact.   Psychiatric:        Behavior: Behavior normal.      ED Treatments / Results  Labs (all labs ordered are listed, but only abnormal results are displayed) Labs Reviewed  URINALYSIS, ROUTINE W REFLEX MICROSCOPIC - Abnormal; Notable for the following components:      Result Value   APPearance CLOUDY (*)  Hgb urine dipstick MODERATE (*)    Leukocytes,Ua MODERATE (*)    All other components within normal limits  CBC WITH DIFFERENTIAL/PLATELET - Abnormal; Notable for the following components:   Hemoglobin 11.3 (*)    MCH 25.6 (*)    MCHC 29.8 (*)    Platelets 405 (*)    All other components within normal limits  COMPREHENSIVE METABOLIC PANEL - Abnormal; Notable for the following components:   Potassium 3.4 (*)    Glucose, Bld 106 (*)    BUN 24 (*)    All other components within normal limits  URINE CULTURE  LIPASE, BLOOD    EKG None  Radiology No results found.  Procedures Procedures (including critical care time)  Medications Ordered in ED Medications - No data to display   Initial Impression / Assessment and Plan / ED Course  I have reviewed the triage vital signs and the nursing notes.  Pertinent labs & imaging results that were available during my care of the patient were reviewed by me and considered in my medical decision making (see chart for details).    Lower abdominal pain and pressure with urinary frequency and urgency.  No fevers, chills, nausea or vomiting.  Abdomen soft without peritoneal signs.  Urinalysis shows hematuria and leukocyte esterase without obvious infection.  Culture sent.  Patient declines pelvic exam.  She has had a previous hysterectomy.  Labs are reassuring.  Abdomen soft without peritoneal signs.  No flank pain or abdominal  pain. Low suspicion for infected ureteral stone. No indication of emergent abdominal CT.  Patient denies any vaginal rash or discharge.  She declines pelvic exam and declined CT scan.  We will treat for possible UTI.  Culture sent.  Return precautions discussed including worsening pain, fever, vomiting, any other concerns. Final Clinical Impressions(s) / ED Diagnoses   Final diagnoses:  Acute cystitis with hematuria  Suprapubic pain    ED Discharge Orders    None       Sigifredo Pignato, Annie Main, MD 02/10/19 [phone]

## 2019-02-09 NOTE — ED Notes (Signed)
Urine sent to lab 

## 2019-02-09 NOTE — ED Triage Notes (Signed)
Pt reports lower abd and suprapubic pain, states she feels like it is urinary problems.

## 2019-02-10 LAB — CBC WITH DIFFERENTIAL/PLATELET
Abs Immature Granulocytes: 0.02 10*3/uL (ref 0.00–0.07)
BASOS ABS: 0.1 10*3/uL (ref 0.0–0.1)
Basophils Relative: 1 %
EOS ABS: 0.2 10*3/uL (ref 0.0–0.5)
EOS PCT: 3 %
HCT: 37.9 % (ref 36.0–46.0)
HEMOGLOBIN: 11.3 g/dL — AB (ref 12.0–15.0)
Immature Granulocytes: 0 %
LYMPHS ABS: 2 10*3/uL (ref 0.7–4.0)
LYMPHS PCT: 24 %
MCH: 25.6 pg — AB (ref 26.0–34.0)
MCHC: 29.8 g/dL — ABNORMAL LOW (ref 30.0–36.0)
MCV: 85.7 fL (ref 80.0–100.0)
Monocytes Absolute: 0.9 10*3/uL (ref 0.1–1.0)
Monocytes Relative: 11 %
NRBC: 0 % (ref 0.0–0.2)
Neutro Abs: 5 10*3/uL (ref 1.7–7.7)
Neutrophils Relative %: 61 %
PLATELETS: 405 10*3/uL — AB (ref 150–400)
RBC: 4.42 MIL/uL (ref 3.87–5.11)
RDW: 12.8 % (ref 11.5–15.5)
WBC: 8.2 10*3/uL (ref 4.0–10.5)

## 2019-02-10 LAB — COMPREHENSIVE METABOLIC PANEL
ALBUMIN: 4.1 g/dL (ref 3.5–5.0)
ALK PHOS: 91 U/L (ref 38–126)
ALT: 16 U/L (ref 0–44)
AST: 23 U/L (ref 15–41)
Anion gap: 9 (ref 5–15)
BUN: 24 mg/dL — ABNORMAL HIGH (ref 6–20)
CALCIUM: 9.7 mg/dL (ref 8.9–10.3)
CHLORIDE: 100 mmol/L (ref 98–111)
CO2: 26 mmol/L (ref 22–32)
Creatinine, Ser: 0.92 mg/dL (ref 0.44–1.00)
GFR calc non Af Amer: >60 mL/min (ref >60)
GLUCOSE: 106 mg/dL — AB (ref 70–99)
POTASSIUM: 3.4 mmol/L — AB (ref 3.5–5.1)
SODIUM: 135 mmol/L (ref 135–145)
Total Bilirubin: 0.5 mg/dL (ref 0.3–1.2)
Total Protein: 7.9 g/dL (ref 6.5–8.1)

## 2019-02-10 LAB — LIPASE, BLOOD: Lipase: 22 U/L (ref 11–51)

## 2019-02-10 MED ORDER — CEPHALEXIN 500 MG PO CAPS
500.0000 mg | ORAL_CAPSULE | Freq: Once | ORAL | Status: AC
  Administered 2019-02-10: 500 mg via ORAL
  Filled 2019-02-10: qty 1

## 2019-02-10 MED ORDER — CEPHALEXIN 500 MG PO CAPS: 500.0000 mg | ORAL_CAPSULE | Freq: Two times a day (BID) | ORAL | 0 refills | Status: DC

## 2019-02-10 NOTE — Discharge Instructions (Addendum)
Take the antibiotics as prescribed and follow-up with your doctor.  You will be called if urine culture grows anything that requires a change in your antibiotics.  Return to the ED if you have worsening pain, fever, vomiting or other concerns.

## 2019-02-12 LAB — URINE CULTURE

## 2019-02-13 ENCOUNTER — Telehealth: Payer: Self-pay

## 2019-02-13 NOTE — Telephone Encounter (Signed)
Post ED Visit - Positive Culture Follow-up  Culture report reviewed by antimicrobial stewardship pharmacist:  [x]  Elenor Quinones, Pharm.D. []  Heide Guile, Pharm.D., BCPS AQ-ID []  Parks Neptune, Pharm.D., BCPS []  Alycia Rossetti, Pharm.D., BCPS []  [place], Pharm.D., BCPS, AAHIVP []  Legrand Como, Pharm.D., BCPS, AAHIVP []  Salome Arnt, PharmD, BCPS []  Johnnette Gourd, PharmD, BCPS []  Hughes Better, PharmD, BCPS []  Leeroy Cha, PharmD  Positive urine culture Treated with Cephalexin, organism sensitive to the same and no further patient follow-up is required at this time.  Genia Del 02/13/2019, 8:33 AM

## 2019-02-19 DIAGNOSIS — Z1211 Encounter for screening for malignant neoplasm of colon: Secondary | ICD-10-CM | POA: Diagnosis not present

## 2019-02-20 LAB — COLOGUARD: COLOGUARD: NEGATIVE

## 2019-03-05 ENCOUNTER — Ambulatory Visit (HOSPITAL_COMMUNITY)
Admission: RE | Admit: 2019-03-05 | Discharge: 2019-03-05 | Disposition: A | Discharge status: Home / Self Care | Payer: BLUE CROSS/BLUE SHIELD | Source: Ambulatory Visit | Attending: Family Medicine | Admitting: Family Medicine

## 2019-03-05 ENCOUNTER — Other Ambulatory Visit: Payer: Self-pay | Admitting: Family Medicine

## 2019-03-05 DIAGNOSIS — Z1231 Encounter for screening mammogram for malignant neoplasm of breast: Secondary | ICD-10-CM

## 2019-03-07 ENCOUNTER — Telehealth: Payer: Self-pay | Admitting: *Deleted

## 2019-03-07 NOTE — Telephone Encounter (Signed)
LVM on patient cell phone to return call to schedule appt

## 2019-04-17 ENCOUNTER — Other Ambulatory Visit: Payer: Self-pay | Admitting: Family Medicine

## 2019-05-01 ENCOUNTER — Telehealth: Payer: Self-pay | Admitting: Family Medicine

## 2019-05-01 DIAGNOSIS — N3000 Acute cystitis without hematuria: Secondary | ICD-10-CM

## 2019-05-01 NOTE — Telephone Encounter (Signed)
Do you want me to order a urine with culture reflex for her to have done today?

## 2019-05-01 NOTE — Telephone Encounter (Signed)
Pt aware.

## 2019-05-01 NOTE — Telephone Encounter (Signed)
Pt is calling in with UTI symptoms, burning, frequency. I put her on the schedule for 05-02-19 @ 1pm,  Advised pt that we would possibly send her to the lab ([facility]) to have a urine specimen.

## 2019-05-01 NOTE — Telephone Encounter (Signed)
Yes let her submit urine today if possible pls

## 2019-05-02 ENCOUNTER — Other Ambulatory Visit: Payer: Self-pay

## 2019-05-02 ENCOUNTER — Ambulatory Visit (INDEPENDENT_AMBULATORY_CARE_PROVIDER_SITE_OTHER): Payer: BLUE CROSS/BLUE SHIELD | Admitting: Family Medicine

## 2019-05-02 ENCOUNTER — Encounter: Payer: Self-pay | Admitting: Family Medicine

## 2019-05-02 VITALS — BP 118/80 | Ht 65.0 in | Wt 169.0 lb

## 2019-05-02 DIAGNOSIS — N3001 Acute cystitis with hematuria: Secondary | ICD-10-CM | POA: Diagnosis not present

## 2019-05-02 MED ORDER — SULFAMETHOXAZOLE-TRIMETHOPRIM 800-160 MG PO TABS: 1.0000 | ORAL_TABLET | Freq: Two times a day (BID) | ORAL | 0 refills | Status: DC

## 2019-05-02 NOTE — Patient Instructions (Addendum)
Thank you for completing your visit via telephone. I appreciate the opportunity to provide you with the care for your health and wellness. Today we discussed: UTI  I have sent in an antibiotic for your UTI.  Please complete this in full.  If there is any adjustment or needs to the medication pending urine culture I will notify you of this findings.  Otherwise complete the medication and its full course make sure that you are drinking water and staying hydrated.  Make sure that you are practicing safe intercourse practices along with good hygiene wiping front to back.  I have attached some information for review regarding UTIs  Please continue to practice social distancing to keep you in our community safe.   [place] YOUR HANDS WELL AND FREQUENTLY. AVOID TOUCHING YOUR FACE, UNLESS YOUR HANDS ARE FRESHLY WASHED.  GET FRESH AIR DAILY. STAY HYDRATED WITH WATER.   It was a pleasure to see you and I look forward to continuing to work together on your health and well-being. Please do not hesitate to call the office if you need care or have questions about your care.  Have a wonderful day and week. With Gratitude, Cherly Beach, DNP, AGNP-BC       Urinary Tract Infection, Adult A urinary tract infection (UTI) is an infection of any part of the urinary tract. The urinary tract includes:  The kidneys.  The ureters.  The bladder.  The urethra. These organs make, store, and get rid of pee (urine) in the body. What are the causes? This is caused by germs (bacteria) in your genital area. These germs grow and cause swelling (inflammation) of your urinary tract. What increases the risk? You are more likely to develop this condition if:  You have a small, thin tube (catheter) to drain pee.  You cannot control when you pee or poop (incontinence).  You are female, and: ? You use these methods to prevent pregnancy: ? A medicine that kills sperm (spermicide). ? A device that blocks sperm  (diaphragm). ? You have low levels of a female hormone (estrogen). ? You are pregnant.  You have genes that add to your risk.  You are sexually active.  You take antibiotic medicines.  You have trouble peeing because of: ? A prostate that is bigger than normal, if you are female. ? A blockage in the part of your body that drains pee from the bladder (urethra). ? A kidney stone. ? A nerve condition that affects your bladder (neurogenic bladder). ? Not getting enough to drink. ? Not peeing often enough.  You have other conditions, such as: ? Diabetes. ? A weak disease-fighting system (immune system). ? Sickle cell disease. ? Gout. ? Injury of the spine. What are the signs or symptoms? Symptoms of this condition include:  Needing to pee right away (urgently).  Peeing often.  Peeing small amounts often.  Pain or burning when peeing.  Blood in the pee.  Pee that smells bad or not like normal.  Trouble peeing.  Pee that is cloudy.  Fluid coming from the vagina, if you are female.  Pain in the belly or lower back. Other symptoms include:  Throwing up (vomiting).  No urge to eat.  Feeling mixed up (confused).  Being tired and grouchy (irritable).  A fever.  Watery poop (diarrhea). How is this treated? This condition may be treated with:  Antibiotic medicine.  Other medicines.  Drinking enough water. Follow these instructions at home:  Medicines  Take  over-the-counter and prescription medicines only as told by your doctor.  If you were prescribed an antibiotic medicine, take it as told by your doctor. Do not stop taking it even if you start to feel better. General instructions  Make sure you: ? Pee until your bladder is empty. ? Do not hold pee for a long time. ? Empty your bladder after sex. ? Wipe from front to back after pooping if you are a female. Use each tissue one time when you wipe.  Drink enough fluid to keep your pee pale yellow.   Keep all follow-up visits as told by your doctor. This is important. Contact a doctor if:  You do not get better after 1-2 days.  Your symptoms go away and then come back. Get help right away if:  You have very bad back pain.  You have very bad pain in your lower belly.  You have a fever.  You are sick to your stomach (nauseous).  You are throwing up. Summary  A urinary tract infection (UTI) is an infection of any part of the urinary tract.  This condition is caused by germs in your genital area.  There are many risk factors for a UTI. These include having a small, thin tube to drain pee and not being able to control when you pee or poop.  Treatment includes antibiotic medicines for germs.  Drink enough fluid to keep your pee pale yellow. This information is not intended to replace advice given to you by your health care provider. Make sure you discuss any questions you have with your health care provider. Document Released: 05/31/2008 Document Revised: 06/22/2018 Document Reviewed: 06/22/2018 Elsevier Interactive Patient Education  2019 [employer].

## 2019-05-02 NOTE — Progress Notes (Signed)
Location of Patient: Home Location of Provider: Telehealth Consent was obtain for visit to be over via telehealth. I verified that I am speaking with the correct person using two identifiers.  [id number], Norwood Levo, MD Chief Complaint  Patient presents with  . Urinary Tract Infection    frequency,painful with urination, sometimes burns    Current Issues:  Presents with 5-6 days of dysuria, urinary urgency and urinary frequency Associated symptoms include:  cloudy urine, dysuria and hematuria Denies fever, chills, flank pain, nausea, vomiting.  There is a previous history of of similar symptoms. No concern for STI.  Prior to Admission medications   Medication Sig Start Date End Date Taking? Authorizing Provider  amLODipine (NORVASC) 5 MG tablet Take 1 tablet by mouth once daily 04/18/19  Yes Fayrene Helper, MD  triamterene-hydrochlorothiazide (MAXZIDE) 75-50 MG tablet TAKE 1 TABLET BY MOUTH ONCE DAILY 11/22/18  Yes Fayrene Helper, MD  UNABLE TO FIND Masectomy [place] and prosthesis x 6 months Dx:D05.11 06/01/17  Yes Fayrene Helper, MD  sulfamethoxazole-trimethoprim (BACTRIM DS) 800-160 MG tablet Take 1 tablet by mouth 2 (two) times daily. 05/02/19   Perlie Mayo, NP    Review of Systems:Review of Systems  Constitutional: Negative.  Negative for chills and fever.  HENT: Negative.   Eyes: Negative.   Respiratory: Negative.   Cardiovascular: Negative.   Gastrointestinal: Negative.   Genitourinary: Positive for dysuria, frequency, hematuria and urgency. Negative for flank pain.  Musculoskeletal: Negative.   Skin: Negative.   Neurological: Negative.   Endo/Heme/Allergies: Negative.   Psychiatric/Behavioral: Negative.   All other systems reviewed and are negative.    PE:  BP 118/80   Ht 5\' 5"  (1.651 m)   Wt 169 lb (76.7 kg)   BMI 28.12 kg/m  Physical Exam Pulmonary:     Effort: Pulmonary effort is normal.  Neurological:     Mental Status: She is alert  and oriented to person, place, and time.  Psychiatric:        Mood and Affect: Mood normal.        Behavior: Behavior normal.        Thought Content: Thought content normal.        Judgment: Judgment normal.     Results for orders placed or performed in visit on 05/01/19  Urinalysis with Culture Reflex  Result Value Ref Range   Color, Urine DARK YELLOW YELLOW   APPearance CLOUDY (A) CLEAR   Specific Gravity, Urine 1.020 1.001 - 1.03   pH > OR = 8.5 (A) 5.0 - 8.0   Glucose, UA NEGATIVE NEGATIVE   Bilirubin Urine NEGATIVE NEGATIVE   Ketones, ur NEGATIVE NEGATIVE   Hgb urine dipstick TRACE (A) NEGATIVE   Protein, ur 1+ (A) NEGATIVE   Nitrites, Initial NEGATIVE NEGATIVE   Leukocyte Esterase 2+ (A) NEGATIVE   WBC, UA > OR = 60 (A) 0 - 5 /HPF   RBC / HPF 3-10 (A) 0 - 2 /HPF   Squamous Epithelial / LPF 0-5 < OR = 5 /HPF   Bacteria, UA MANY (A) NONE SEEN /HPF   Hyaline Cast NONE SEEN NONE SEEN /LPF  REFLEXIVE URINE CULTURE  Result Value Ref Range   REFLEXIVE URINE CULTURE CULTURE INDICATED - RESULTS TO FOLLOW     Assessment and Plan:  1. Acute cystitis with hematuria Urinalysis demonstrated urinary tract infection. She denies having any signs and symptoms of pyelonephritis at this time.  Provided with education about prevention of urinary tract  infection in the future.  As she continues to have some of these issues ongoing.  No concern for STI at this time.  Pending reflex culture if medication needs to be changed will change medication at this time can do 3 days treatment of Bactrim.  She is advised that if her symptoms abate but then come back after treatment she is to contact the office back.  - sulfamethoxazole-trimethoprim (BACTRIM DS) 800-160 MG tablet; Take 1 tablet by mouth 2 (two) times daily.  Dispense: 6 tablet; Refill: 0  I provided 5 minutes of non-face-to-face time during this encounter.  Follow-up: As needed  Perlie Mayo, DNP, AGNP-BC [address] [place], [place] [address] Office Hours: Mon-Thurs 8 am-5 pm; Fri 8 am-12 pm Office Phone:  [phone]  Office Fax: [phone]

## 2019-05-04 LAB — URINALYSIS W MICROSCOPIC + REFLEX CULTURE
Bilirubin Urine: NEGATIVE
Glucose, UA: NEGATIVE
Hyaline Cast: NONE SEEN /LPF
Ketones, ur: NEGATIVE
Nitrites, Initial: NEGATIVE
Specific Gravity, Urine: 1.02 (ref 1.001–1.03)
WBC, UA: >=60 /HPF — AB (ref 0–5)
pH: >=8.5 — AB (ref 5.0–8.0)

## 2019-05-04 LAB — URINE CULTURE
MICRO NUMBER:: 449874
SPECIMEN QUALITY:: ADEQUATE

## 2019-05-04 LAB — CULTURE INDICATED

## 2019-05-31 ENCOUNTER — Encounter: Payer: BLUE CROSS/BLUE SHIELD | Admitting: Family Medicine

## 2019-07-04 ENCOUNTER — Other Ambulatory Visit: Payer: Self-pay | Admitting: Family Medicine

## 2019-07-16 ENCOUNTER — Other Ambulatory Visit: Payer: Self-pay

## 2019-07-16 ENCOUNTER — Ambulatory Visit (INDEPENDENT_AMBULATORY_CARE_PROVIDER_SITE_OTHER): Payer: BLUE CROSS/BLUE SHIELD | Admitting: Family Medicine

## 2019-07-16 ENCOUNTER — Encounter: Payer: Self-pay | Admitting: Family Medicine

## 2019-07-16 VITALS — BP 126/76 | HR 82 | Resp 12 | Ht 65.0 in | Wt 153.0 lb

## 2019-07-16 DIAGNOSIS — I10 Essential (primary) hypertension: Secondary | ICD-10-CM

## 2019-07-16 DIAGNOSIS — E663 Overweight: Secondary | ICD-10-CM | POA: Diagnosis not present

## 2019-07-16 DIAGNOSIS — Z0000 Encounter for general adult medical examination without abnormal findings: Secondary | ICD-10-CM | POA: Diagnosis not present

## 2019-07-16 NOTE — Patient Instructions (Addendum)
F/U in 6 months, call if you need me before  Congrats on weight loss because of lifestyle change  Call and come in for flu vaccine early Fall   TSH., and chem 7 and eGFR  First week in January  Three stool cards today from  Nurse to be returned as soon as possible  No med changes  Thanks for choosing [facility], we consider it a privelige to serve you. Social distancing. Frequent hand washing with soap and water Keeping your hands off of your face. These 3 practices will help to keep both you and your community healthy during this time. Please practice them faithfully!

## 2019-07-16 NOTE — Assessment & Plan Note (Signed)

## 2019-07-16 NOTE — Progress Notes (Signed)
    Paula Randolph     MRN: [id number]      DOB: 1961/04/18  HPI: Patient is in for annual physical exam. No other health concerns are expressed or addressed at the visit. Recent labs, if available are reviewed. Immunization is reviewed , and  updated if needed.   PE: BP 126/76   Pulse 82   Resp 12   Ht 5\' 5"  (1.651 m)   Wt 153 lb (69.4 kg)   SpO2 98%   BMI 25.46 kg/m   Pleasant  female, alert and oriented x 3, in no cardio-pulmonary distress. Afebrile. HEENT No facial trauma or asymetry. Sinuses non tender.  Extra occullar muscles intact External ears normal,Oropharynx moist, no exudate. Neck: supple, no adenopathy,JVD or thyromegaly.No bruits.  Chest: Clear to ascultation bilaterally.No crackles or wheezes. Non tender to palpation  Breast: No asymetry,no masses or lumps. No tenderness. No nipple discharge or inversion. No axillary or supraclavicular adenopathy  Cardiovascular system; Heart sounds normal,  S1 and  S2 ,no S3.  No murmur, or thrill. Apical beat not displaced Peripheral pulses normal.  Abdomen: Soft, non tender, no organomegaly or masses. No bruits. Bowel sounds normal. No guarding, tenderness or rebound.    Musculoskeletal exam: Full ROM of spine, hips , shoulders and knees. No deformity ,swelling or crepitus noted. No muscle wasting or atrophy.   Neurologic: Cranial nerves 2 to 12 intact. Power, tone ,sensation and reflexes normal throughout. No disturbance in gait. No tremor.  Skin: Intact, no ulceration, erythema , scaling or rash noted. Pigmentation normal throughout  Psych; Normal mood and affect. Judgement and concentration normal   Assessment & Plan:  Annual physical exam Annual exam as documented. Counseling done  re healthy lifestyle involving commitment to 150 minutes exercise per week, heart healthy diet, and attaining healthy weight.The importance of adequate sleep also discussed. Regular seat belt use and home safety,  is also discussed. Changes in health habits are decided on by the patient with goals and time frames  set for achieving them. Immunization and cancer screening needs are specifically addressed at this visit.

## 2019-07-23 IMAGING — MG DIGITAL SCREENING BILATERAL MAMMOGRAM WITH TOMO AND CAD
8 series · 8 of 24 positions shown · non-contrast
Comparison: Previous exam(s).

CLINICAL DATA: Screening.

EXAM:
DIGITAL SCREENING BILATERAL MAMMOGRAM WITH TOMO AND CAD

[R MLO synth-2D]
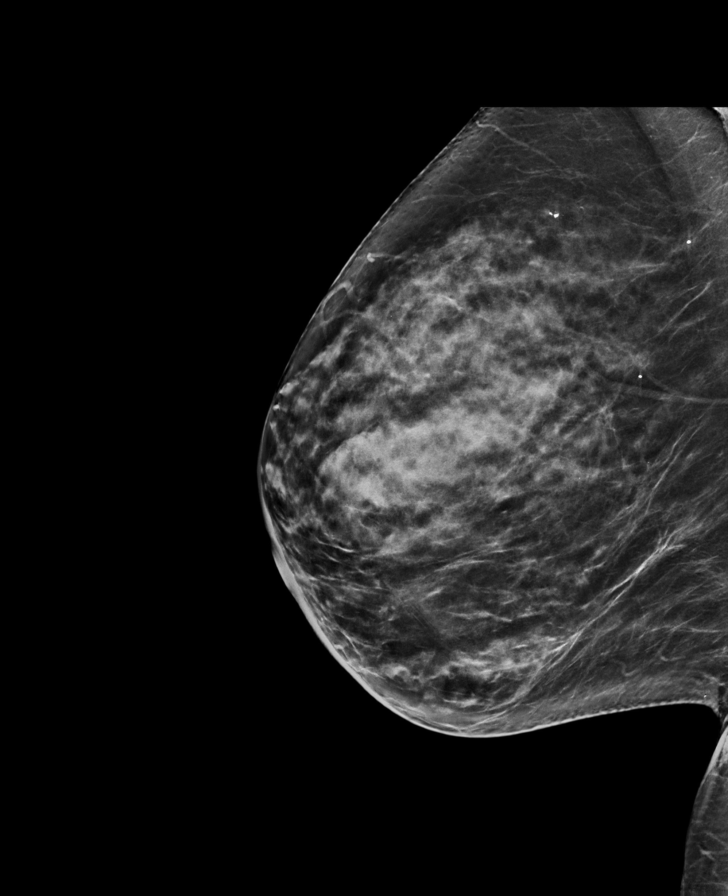

[L CC synth-2D]
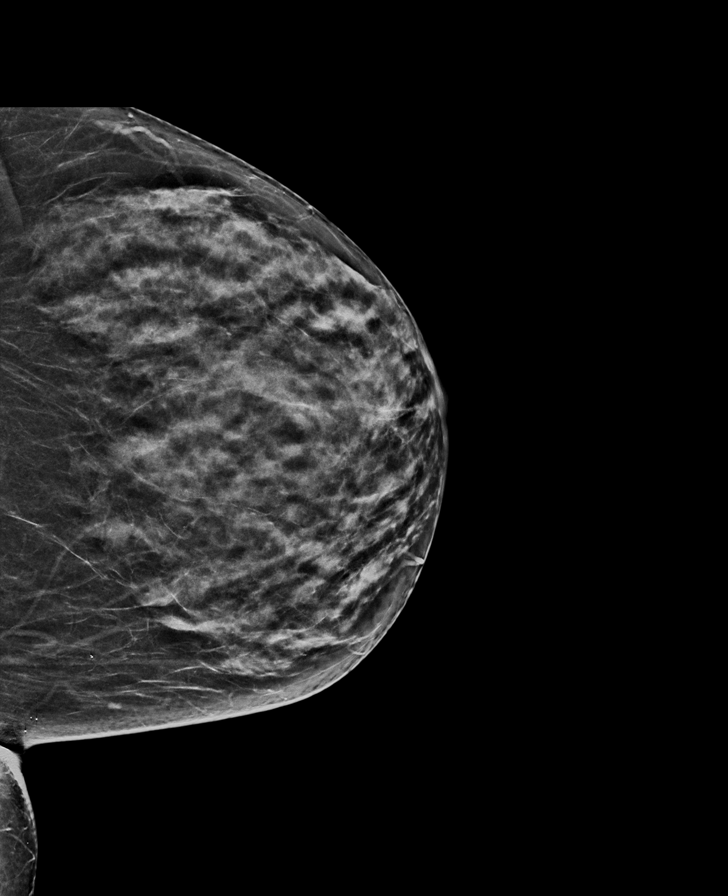

[R CC synth-2D]
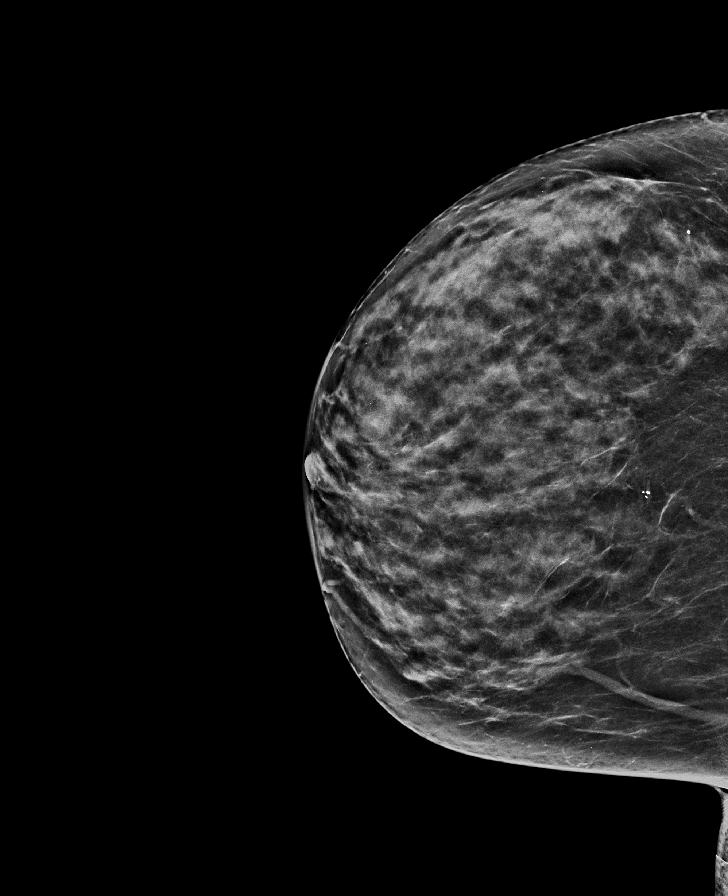

[L MLO synth-2D]
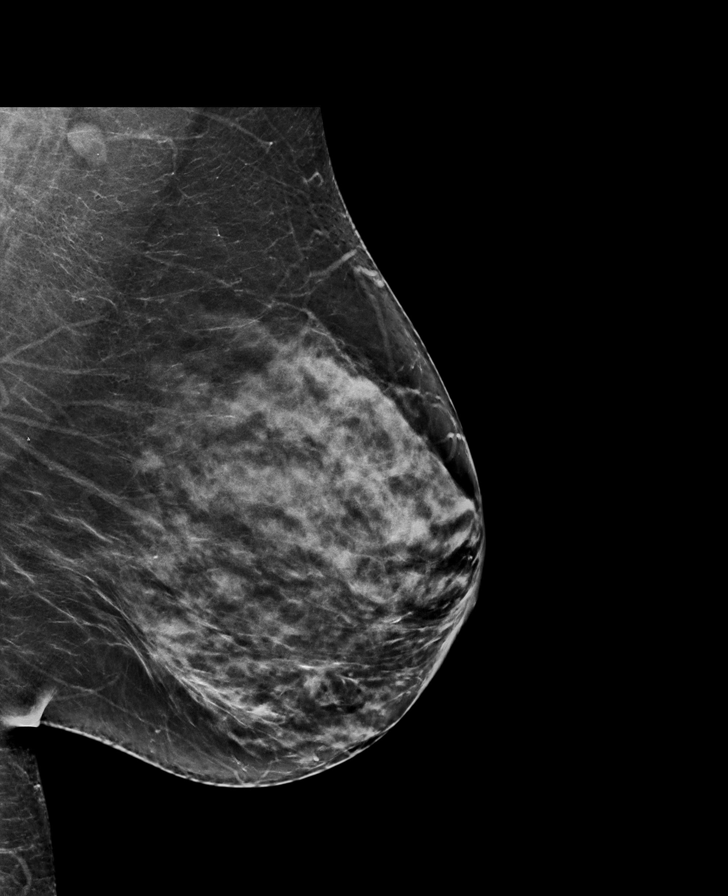

[L MLO tomo · tomo slice 37/74.0]
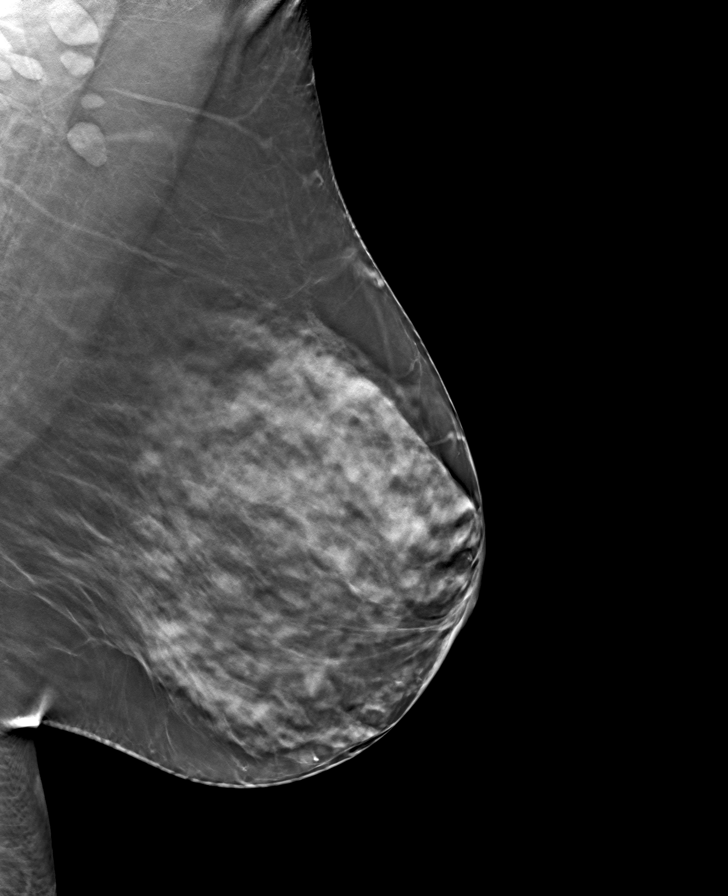

[R MLO tomo · tomo slice 37/73.0]
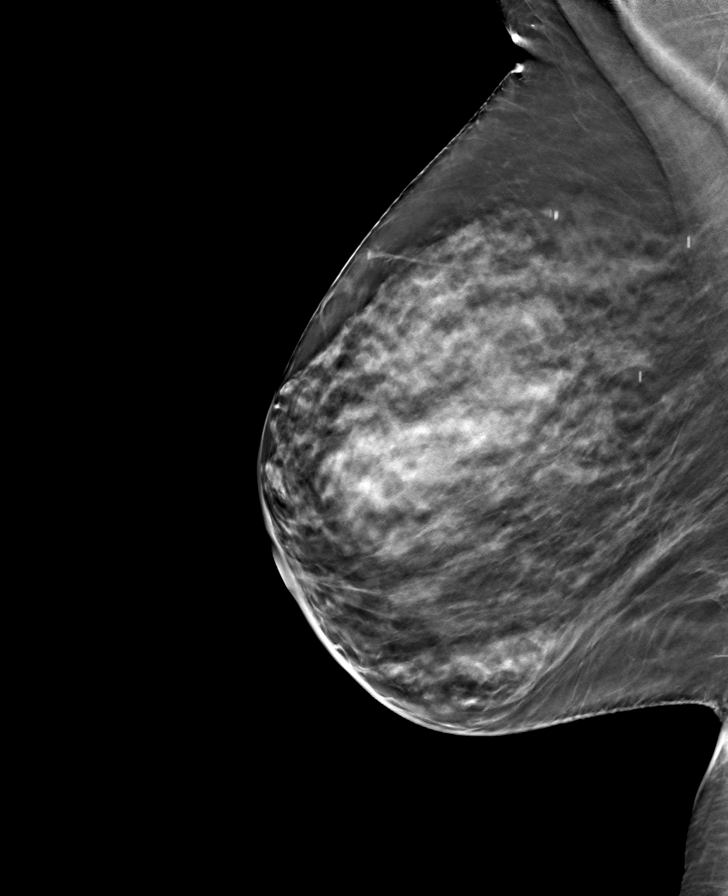

[R CC tomo · tomo slice 33/66.0]
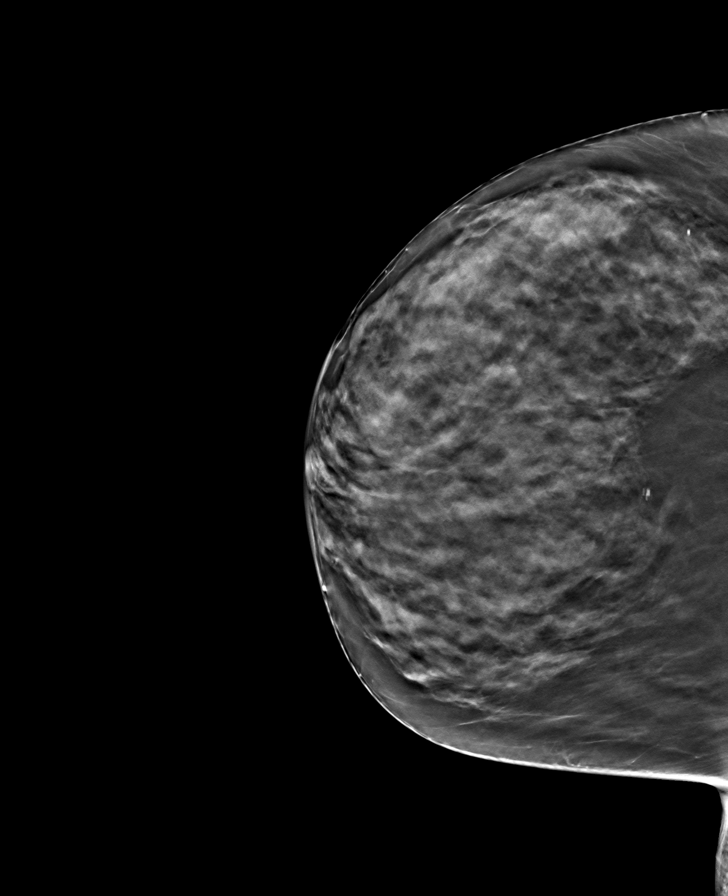

[L CC tomo · tomo slice 32/63.0]
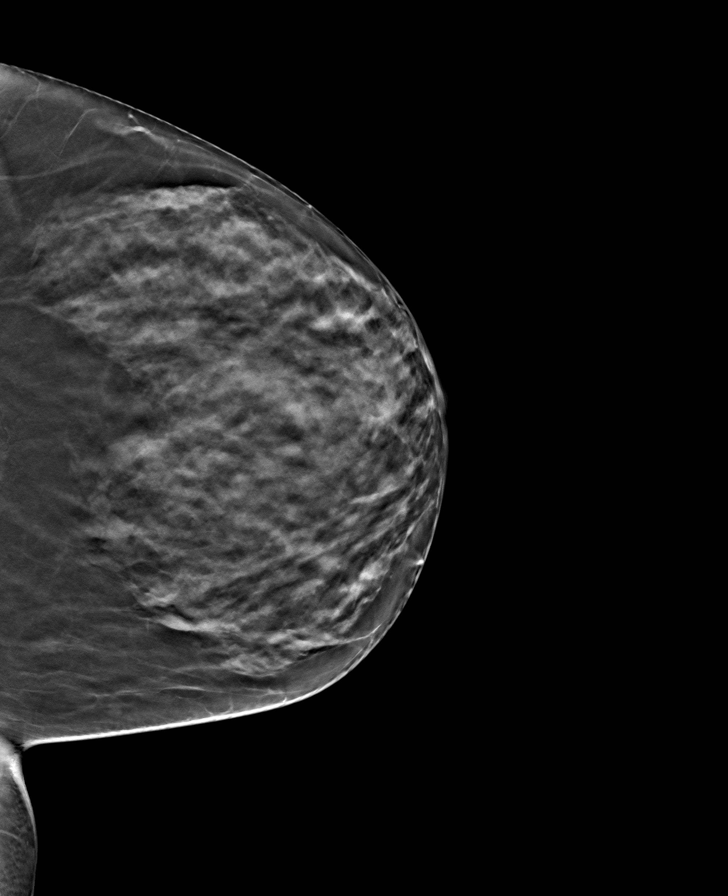

[8 of 24 positions shown; findings below may reference images not displayed]

ACR Breast Density Category c: The breast tissue is heterogeneously
dense, which may obscure small masses.
FINDINGS: There are no findings suspicious for malignancy. Images were
processed with CAD.
IMPRESSION: No mammographic evidence of malignancy. A result letter of this
screening mammogram will be mailed directly to the patient.

RECOMMENDATION:
Screening mammogram in one year. (Code:[id number])

BI-RADS CATEGORY  1: Negative.

## 2019-08-06 ENCOUNTER — Other Ambulatory Visit: Payer: Self-pay | Admitting: Family Medicine

## 2019-08-17 ENCOUNTER — Telehealth: Payer: Self-pay | Admitting: *Deleted

## 2019-08-17 NOTE — Telephone Encounter (Signed)
Pt needs a prescription to go to [facility] in [place] to get her bras

## 2019-08-17 NOTE — Telephone Encounter (Signed)
Called to let patient know I wasn't at [facility] today but I would be back in the office on Monday and would send in the order for her bras then. No answer and her vm couldn't accept messages.

## 2019-08-20 ENCOUNTER — Other Ambulatory Visit: Payer: Self-pay

## 2019-08-20 ENCOUNTER — Ambulatory Visit (INDEPENDENT_AMBULATORY_CARE_PROVIDER_SITE_OTHER): Payer: BLUE CROSS/BLUE SHIELD

## 2019-08-20 DIAGNOSIS — Z23 Encounter for immunization: Secondary | ICD-10-CM | POA: Diagnosis not present

## 2019-08-21 ENCOUNTER — Telehealth: Payer: Self-pay

## 2019-08-21 DIAGNOSIS — D0511 Intraductal carcinoma in situ of right breast: Secondary | ICD-10-CM

## 2019-08-21 MED ORDER — UNABLE TO FIND: 0 refills | Status: AC

## 2019-08-21 NOTE — Telephone Encounter (Signed)
Prescription placed for bras and faxed to [facility] in [place]

## 2019-08-21 NOTE — Telephone Encounter (Signed)
Prescription faxed to [facility]. Called to make patient aware, however vm is full and I'm unable to leave a message at this time.

## 2019-08-22 ENCOUNTER — Other Ambulatory Visit (INDEPENDENT_AMBULATORY_CARE_PROVIDER_SITE_OTHER): Payer: BLUE CROSS/BLUE SHIELD

## 2019-08-22 DIAGNOSIS — Z1211 Encounter for screening for malignant neoplasm of colon: Secondary | ICD-10-CM

## 2019-08-22 LAB — HEMOCCULT GUIAC POC 1CARD (OFFICE)
Card #2 Fecal Occult Blod, POC: NEGATIVE
Card #3 Fecal Occult Blood, POC: NEGATIVE
Fecal Occult Blood, POC: NEGATIVE

## 2019-10-22 ENCOUNTER — Ambulatory Visit (INDEPENDENT_AMBULATORY_CARE_PROVIDER_SITE_OTHER): Payer: BLUE CROSS/BLUE SHIELD | Admitting: Family Medicine

## 2019-10-22 ENCOUNTER — Other Ambulatory Visit: Payer: Self-pay

## 2019-10-22 ENCOUNTER — Encounter (INDEPENDENT_AMBULATORY_CARE_PROVIDER_SITE_OTHER): Payer: Self-pay

## 2019-10-22 ENCOUNTER — Encounter: Payer: Self-pay | Admitting: Family Medicine

## 2019-10-22 ENCOUNTER — Other Ambulatory Visit (HOSPITAL_COMMUNITY)
Admission: RE | Admit: 2019-10-22 | Discharge: 2019-10-22 | Disposition: A | Discharge status: Home / Self Care | Payer: BLUE CROSS/BLUE SHIELD | Source: Ambulatory Visit | Attending: Family Medicine | Admitting: Family Medicine

## 2019-10-22 VITALS — BP 118/80 | HR 80 | Temp 97.0°F | Resp 16 | Ht 65.0 in | Wt 147.0 lb

## 2019-10-22 DIAGNOSIS — E663 Overweight: Secondary | ICD-10-CM

## 2019-10-22 DIAGNOSIS — I10 Essential (primary) hypertension: Secondary | ICD-10-CM | POA: Diagnosis not present

## 2019-10-22 DIAGNOSIS — Z23 Encounter for immunization: Secondary | ICD-10-CM | POA: Insufficient documentation

## 2019-10-22 DIAGNOSIS — N3001 Acute cystitis with hematuria: Secondary | ICD-10-CM

## 2019-10-22 DIAGNOSIS — C50911 Malignant neoplasm of unspecified site of right female breast: Secondary | ICD-10-CM

## 2019-10-22 LAB — POCT URINALYSIS DIP (CLINITEK)
Bilirubin, UA: NEGATIVE
Glucose, UA: NEGATIVE mg/dL
Nitrite, UA: NEGATIVE
POC PROTEIN,UA: 100 — AB
Spec Grav, UA: 1.025 (ref 1.010–1.025)
Urobilinogen, UA: 0.2 E.U./dL
pH, UA: 7.5 (ref 5.0–8.0)

## 2019-10-22 MED ORDER — CIPROFLOXACIN HCL 500 MG PO TABS: 500.0000 mg | ORAL_TABLET | Freq: Two times a day (BID) | ORAL | 0 refills | Status: DC

## 2019-10-22 NOTE — Assessment & Plan Note (Signed)
Controlled, no change in medication DASH diet and commitment to daily physical activity for a minimum of 30 minutes discussed and encouraged, as a part of hypertension management. The importance of attaining a healthy weight is also discussed.  BP/Weight 10/22/2019 07/16/2019 05/02/2019 02/09/2019 10/25/2018 05/25/2018 [id number]  Systolic BP 123456 [id number] 123456 123456 [id number]  Diastolic BP 80 76 80 86 80 80 84  Wt. (Lbs) 147 153 169 - 166.4 161 158.7  BMI 24.46 25.46 28.12 - 28.56 27.64 27.24

## 2019-10-22 NOTE — Patient Instructions (Addendum)
F/U in January as before, shingrix # 2 at visit  Shingrix #1 today  5 day antibiotic course is prescribed, cipro, for your bladdder infection   Urinary Tract Infection, Adult  A urinary tract infection (UTI) is an infection of any part of the urinary tract. The urinary tract includes the kidneys, ureters, bladder, and urethra. These organs make, store, and get rid of urine in the body. Your health care provider may use other names to describe the infection. An upper UTI affects the ureters and kidneys (pyelonephritis). A lower UTI affects the bladder (cystitis) and urethra (urethritis). What are the causes? Most urinary tract infections are caused by bacteria in your genital area, around the entrance to your urinary tract (urethra). These bacteria grow and cause inflammation of your urinary tract. What increases the risk? You are more likely to develop this condition if:  You have a urinary catheter that stays in place (indwelling).  You are not able to control when you urinate or have a bowel movement (you have incontinence).  You are female and you: ? Use a spermicide or diaphragm for birth control. ? Have low estrogen levels. ? Are pregnant.  You have certain genes that increase your risk (genetics).  You are sexually active.  You take antibiotic medicines.  You have a condition that causes your flow of urine to slow down, such as: ? An enlarged prostate, if you are female. ? Blockage in your urethra (stricture). ? A kidney stone. ? A nerve condition that affects your bladder control (neurogenic bladder). ? Not getting enough to drink, or not urinating often.  You have certain medical conditions, such as: ? Diabetes. ? A weak disease-fighting system (immunesystem). ? Sickle cell disease. ? Gout. ? Spinal cord injury. What are the signs or symptoms? Symptoms of this condition include:  Needing to urinate right away (urgently).  Frequent urination or passing small  amounts of urine frequently.  Pain or burning with urination.  Blood in the urine.  Urine that smells bad or unusual.  Trouble urinating.  Cloudy urine.  Vaginal discharge, if you are female.  Pain in the abdomen or the lower back. You may also have:  Vomiting or a decreased appetite.  Confusion.  Irritability or tiredness.  A fever.  Diarrhea. The first symptom in older adults may be confusion. In some cases, they may not have any symptoms until the infection has worsened. How is this diagnosed? This condition is diagnosed based on your medical history and a physical exam. You may also have other tests, including:  Urine tests.  Blood tests.  Tests for sexually transmitted infections (STIs). If you have had more than one UTI, a cystoscopy or imaging studies may be done to determine the cause of the infections. How is this treated? Treatment for this condition includes:  Antibiotic medicine.  Over-the-counter medicines to treat discomfort.  Drinking enough water to stay hydrated. If you have frequent infections or have other conditions such as a kidney stone, you may need to see a health care provider who specializes in the urinary tract (urologist). In rare cases, urinary tract infections can cause sepsis. Sepsis is a life-threatening condition that occurs when the body responds to an infection. Sepsis is treated in the hospital with IV antibiotics, fluids, and other medicines. Follow these instructions at home:  Medicines  Take over-the-counter and prescription medicines only as told by your health care provider.  If you were prescribed an antibiotic medicine, take it as told by your  health care provider. Do not stop using the antibiotic even if you start to feel better. General instructions  Make sure you: ? Empty your bladder often and completely. Do not hold urine for long periods of time. ? Empty your bladder after sex. ? Wipe from front to back after a  bowel movement if you are female. Use each tissue one time when you wipe.  Drink enough fluid to keep your urine pale yellow.  Keep all follow-up visits as told by your health care provider. This is important. Contact a health care provider if:  Your symptoms do not get better after 1-2 days.  Your symptoms go away and then return. Get help right away if you have:  Severe pain in your back or your lower abdomen.  A fever.  Nausea or vomiting. Summary  A urinary tract infection (UTI) is an infection of any part of the urinary tract, which includes the kidneys, ureters, bladder, and urethra.  Most urinary tract infections are caused by bacteria in your genital area, around the entrance to your urinary tract (urethra).  Treatment for this condition often includes antibiotic medicines.  If you were prescribed an antibiotic medicine, take it as told by your health care provider. Do not stop using the antibiotic even if you start to feel better.  Keep all follow-up visits as told by your health care provider. This is important. This information is not intended to replace advice given to you by your health care provider. Make sure you discuss any questions you have with your health care provider. Document Released: 09/22/2005 Document Revised: 11/30/2018 Document Reviewed: 06/22/2018 Elsevier Patient Education  2020 [employer].

## 2019-10-22 NOTE — Assessment & Plan Note (Signed)
  Patient re-educated about  the importance of commitment to a  minimum of 150 minutes of exercise per week as able.  The importance of healthy food choices with portion control discussed, as well as eating regularly and within a 12 hour window most days. The need to choose "clean , green" food 50 to 75% of the time is discussed, as well as to make water the primary drink and set a goal of 64 ounces water daily.    Weight /BMI 10/22/2019 07/16/2019 05/02/2019  WEIGHT 147 lb 153 lb 169 lb  HEIGHT 5\' 5"  5\' 5"  5\' 5"   BMI 24.46 kg/m2 25.46 kg/m2 28.12 kg/m2

## 2019-10-22 NOTE — Progress Notes (Addendum)
   Paula Randolph     MRN: [id number]      DOB: 28-Jul-1961   HPI Paula Randolph is here with a 3 day h/o dysuria, frequency  and lower abdominal pain. Denies fever , chills or flank pain. Has ahd UTI in the past states does not drink sufficient water or empty often enough, but will address both   ROS Denies recent fever or chills. Denies sinus pressure, nasal congestion, ear pain or sore throat. Denies chest congestion, productive cough or wheezing. Denies chest pains, palpitations and leg swelling Denies abdominal pain, nausea, vomiting,diarrhea or constipation.   . Denies joint pain, swelling and limitation in mobility. Denies headaches, seizures, numbness, or tingling. Denies depression, anxiety or insomnia. Denies skin break down or rash.   PE  BP 118/80   Pulse 80   Temp (!) 97 F (36.1 C) (Temporal)   Resp 16   Ht 5\' 5"  (1.651 m)   Wt 147 lb (66.7 kg)   SpO2 99%   BMI 24.46 kg/m   Patient alert and oriented and in no cardiopulmonary distress.  HEENT: No facial asymmetry, EOMI,     Neck supple .  Chest: Clear to auscultation bilaterally.  CVS: S1, S2 no murmurs, no S3.Regular rate.  ABD: Soft non tender.no flank pain or tenderness   Ext: No edema  MS: Adequate ROM spine, shoulders, hips and knees.  Skin: Intact, no ulcerations or rash noted.  Psych: Good eye contact, normal affect. Memory intact not anxious or depressed appearing.  CNS: CN 2-12 intact, power,  normal throughout.no focal deficits noted.   Assessment & Plan  Acute cystitis with hematuria 5 day cipro course prescribed , and urine for c/s  Essential hypertension Controlled, no change in medication DASH diet and commitment to daily physical activity for a minimum of 30 minutes discussed and encouraged, as a part of hypertension management. The importance of attaining a healthy weight is also discussed.  BP/Weight 10/22/2019 07/16/2019 05/02/2019 02/09/2019 10/25/2018 05/25/2018 [id number]   Systolic BP 123456 [id number] 123456 123456 123456 [id number]  Diastolic BP 80 76 80 86 80 80 84  Wt. (Lbs) 147 153 169 - 166.4 161 158.7  BMI 24.46 25.46 28.12 - 28.56 27.64 27.24       Overweight (BMI 25.0-29.9)  Patient re-educated about  the importance of commitment to a  minimum of 150 minutes of exercise per week as able.  The importance of healthy food choices with portion control discussed, as well as eating regularly and within a 12 hour window most days. The need to choose "clean , green" food 50 to 75% of the time is discussed, as well as to make water the primary drink and set a goal of 64 ounces water daily.    Weight /BMI 10/22/2019 07/16/2019 05/02/2019  WEIGHT 147 lb 153 lb 169 lb  HEIGHT 5\' 5"  5\' 5"  5\' 5"   BMI 24.46 kg/m2 25.46 kg/m2 28.12 kg/m2      Breast cancer, right ([facility]) Lifelong need for mastectomy supplies, currently allowed 6 bras every 12 months

## 2019-10-22 NOTE — Assessment & Plan Note (Signed)
5 day cipro course prescribed , and urine for c/s

## 2019-10-25 ENCOUNTER — Other Ambulatory Visit: Payer: Self-pay | Admitting: Family Medicine

## 2019-10-25 LAB — URINE CULTURE: Culture: >=100000 — AB

## 2019-10-25 MED ORDER — AMPICILLIN 500 MG PO CAPS: 500.0000 mg | ORAL_CAPSULE | Freq: Three times a day (TID) | ORAL | 0 refills | Status: DC

## 2019-10-26 DIAGNOSIS — D0511 Intraductal carcinoma in situ of right breast: Secondary | ICD-10-CM | POA: Diagnosis not present

## 2019-10-29 ENCOUNTER — Telehealth: Payer: Self-pay

## 2019-10-29 NOTE — Progress Notes (Signed)
Patient aware.

## 2019-11-06 ENCOUNTER — Other Ambulatory Visit: Payer: Self-pay

## 2019-11-06 ENCOUNTER — Telehealth: Payer: Self-pay | Admitting: *Deleted

## 2019-11-06 MED ORDER — TRIAMTERENE-HCTZ 75-50 MG PO TABS: 1.0000 | ORAL_TABLET | Freq: Every day | ORAL | 1 refills | Status: DC

## 2019-11-06 NOTE — Telephone Encounter (Signed)
Pt called needing a refill on her maxzide sent to [facility] in [place] she is out of the medication

## 2019-11-06 NOTE — Telephone Encounter (Signed)
Med refilled.

## 2019-11-15 ENCOUNTER — Encounter: Payer: Self-pay | Admitting: Family Medicine

## 2019-11-15 DIAGNOSIS — C50911 Malignant neoplasm of unspecified site of right female breast: Secondary | ICD-10-CM | POA: Insufficient documentation

## 2019-11-15 NOTE — Assessment & Plan Note (Signed)
Lifelong need for mastectomy supplies, currently allowed 6 bras every 12 months

## 2019-12-02 ENCOUNTER — Other Ambulatory Visit: Payer: Self-pay | Admitting: Family Medicine

## 2020-01-04 DIAGNOSIS — I10 Essential (primary) hypertension: Secondary | ICD-10-CM | POA: Diagnosis not present

## 2020-01-05 LAB — BASIC METABOLIC PANEL WITH GFR
BUN: 16 mg/dL (ref 7–25)
CO2: 31 mmol/L (ref 20–32)
Calcium: 9.9 mg/dL (ref 8.6–10.4)
Chloride: 103 mmol/L (ref 98–110)
Creat: 0.69 mg/dL (ref 0.50–1.05)
GFR, Est African American: 110 mL/min/{1.73_m2} (ref >=60)
GFR, Est Non African American: 95 mL/min/{1.73_m2} (ref >=60)
Glucose, Bld: 96 mg/dL (ref 65–99)
Potassium: 3.8 mmol/L (ref 3.5–5.3)
Sodium: 142 mmol/L (ref 135–146)

## 2020-01-05 LAB — TSH: TSH: 1.27 mIU/L (ref 0.40–4.50)

## 2020-01-16 ENCOUNTER — Encounter: Payer: Self-pay | Admitting: Family Medicine

## 2020-01-16 ENCOUNTER — Other Ambulatory Visit: Payer: Self-pay

## 2020-01-16 ENCOUNTER — Ambulatory Visit (INDEPENDENT_AMBULATORY_CARE_PROVIDER_SITE_OTHER): Payer: Self-pay | Admitting: Family Medicine

## 2020-01-16 VITALS — BP 120/80 | Ht 65.0 in | Wt 148.0 lb

## 2020-01-16 DIAGNOSIS — Z1231 Encounter for screening mammogram for malignant neoplasm of breast: Secondary | ICD-10-CM

## 2020-01-16 DIAGNOSIS — C50911 Malignant neoplasm of unspecified site of right female breast: Secondary | ICD-10-CM

## 2020-01-16 DIAGNOSIS — E785 Hyperlipidemia, unspecified: Secondary | ICD-10-CM

## 2020-01-16 DIAGNOSIS — E559 Vitamin D deficiency, unspecified: Secondary | ICD-10-CM

## 2020-01-16 DIAGNOSIS — I10 Essential (primary) hypertension: Secondary | ICD-10-CM

## 2020-01-16 NOTE — Patient Instructions (Addendum)
Annual physical exam in office with MD July 21 or after , call if you need me before  Nurse visit for shingrix #2 second or third week in March  Please  Schedule mammogram after 2:30 pm any weekday, when due  Please get CBC, fasting lipid, cmp and EGFr, , vitD  Early July  It is important that you exercise regularly at least 30 minutes 5 times a week. If you develop chest pain, have severe difficulty breathing, or feel very tired, stop exercising immediately and seek medical attention   Think about what you will eat, plan ahead. Choose " clean, green, fresh or frozen" over canned, processed or packaged foods which are more sugary, salty and fatty. 70 to 75% of food eaten should be vegetables and fruit. Three meals at set times with snacks allowed between meals, but they must be fruit or vegetables. Aim to eat over a 12 hour period , example 7 am to 7 pm, and STOP after  your last meal of the day. Drink water,generally about 64 ounces per day, no other drink is as healthy. Fruit juice is best enjoyed in a healthy way, by EATING the fruit.   Thanks for choosing [facility], we consider it a privelige to serve you.

## 2020-01-16 NOTE — Progress Notes (Signed)
.  Virtual Visit via Telephone Note  I connected with Paula Randolph on 01/16/20 at  3:00 PM EST by telephone and verified that I am speaking with the correct person using two identifiers.  Location: Patient: home Provider: office   I discussed the limitations, risks, security and privacy concerns of performing an evaluation and management service by telephone and the availability of in person appointments. I also discussed with the patient that there may be a patient responsible charge related to this service. The patient expressed understanding and agreed to proceed.   History of Present Illness:   F/U chronic problems, medication review, and refill medication when necessary. Review most recent labs and order labs which are due Review preventive health and update with necessary referrals or immunizations as indicated No complaints or concerns, doing well Denies recent fever or chills. Denies sinus pressure, nasal congestion, ear pain or sore throat. Denies chest congestion, productive cough or wheezing. Denies chest pains, palpitations and leg swelling Denies abdominal pain, nausea, vomiting,diarrhea or constipation.   Denies dysuria, frequency, hesitancy or incontinence. Denies joint pain, swelling and limitation in mobility. Denies headaches, seizures, numbness, or tingling. Denies depression, anxiety or insomnia. Denies skin break down or rash.     Observations/Objective:  BP 120/80   Ht 5\' 5"  (1.651 m)   Wt 148 lb (67.1 kg)   BMI 24.63 kg/m  Good communication with no confusion and intact memory. Alert and oriented x 3 No signs of respiratory distress during speech   Assessment and Plan: Essential hypertension Controlled, no change in medication DASH diet and commitment to daily physical activity for a minimum of 30 minutes discussed and encouraged, as a part of hypertension management. The importance of attaining a healthy weight is also discussed.  BP/Weight  01/16/2020 10/22/2019 07/16/2019 05/02/2019 02/09/2019 10/25/2018 [id number]  Systolic BP 123456 123456 [id number] 123456 123456 123456 [id number]  Diastolic BP 80 80 76 80 86 80 80  Wt. (Lbs) 148 147 153 169 - 166.4 161  BMI 24.63 24.46 25.46 28.12 - 28.56 27.64       Dyslipidemia Hyperlipidemia:Low fat diet discussed and encouraged.   Lipid Panel  Lab Results  Component Value Date   CHOL 225 (H) 01/18/2018   HDL 93 01/18/2018   LDLCALC 117 (H) 01/18/2018   TRIG 60 01/18/2018   CHOLHDL 2.4 01/18/2018   Need to reduce fat in diet Updated lab needed at/ before next visit.     Need for shingles vaccine appt in march, recivincovid vaccine this weeek  Breast cancer, right ([place]) Will continue prescribe mastectomy supplies   Follow Up Instructions:    I discussed the assessment and treatment plan with the patient. The patient was provided an opportunity to ask questions and all were answered. The patient agreed with the plan and demonstrated an understanding of the instructions.   The patient was advised to call back or seek an in-person evaluation if the symptoms worsen or if the condition fails to improve as anticipated.  I provided 64minutes of non-face-to-face time during this encounter.   Tula Nakayama, MD

## 2020-01-19 ENCOUNTER — Encounter: Payer: Self-pay | Admitting: Family Medicine

## 2020-01-19 NOTE — Assessment & Plan Note (Signed)
appt in march, recivincovid vaccine this weeek

## 2020-01-19 NOTE — Assessment & Plan Note (Signed)
Hyperlipidemia:Low fat diet discussed and encouraged.   Lipid Panel  Lab Results  Component Value Date   CHOL 225 (H) 01/18/2018   HDL 93 01/18/2018   LDLCALC 117 (H) 01/18/2018   TRIG 60 01/18/2018   CHOLHDL 2.4 01/18/2018   Need to reduce fat in diet Updated lab needed at/ before next visit.

## 2020-01-19 NOTE — Assessment & Plan Note (Signed)
Will continue prescribe mastectomy supplies

## 2020-01-19 NOTE — Assessment & Plan Note (Signed)
Controlled, no change in medication DASH diet and commitment to daily physical activity for a minimum of 30 minutes discussed and encouraged, as a part of hypertension management. The importance of attaining a healthy weight is also discussed.  BP/Weight 01/16/2020 10/22/2019 07/16/2019 05/02/2019 02/09/2019 10/25/2018 [id number]  Systolic BP 123456 123456 [id number] 123456 123456 123456 [id number]  Diastolic BP 80 80 76 80 86 80 80  Wt. (Lbs) 148 147 153 169 - 166.4 161  BMI 24.63 24.46 25.46 28.12 - 28.56 27.64

## 2020-03-04 ENCOUNTER — Ambulatory Visit: Payer: Self-pay

## 2020-03-10 ENCOUNTER — Other Ambulatory Visit: Payer: Self-pay

## 2020-03-10 ENCOUNTER — Ambulatory Visit (HOSPITAL_COMMUNITY)
Admission: RE | Admit: 2020-03-10 | Discharge: 2020-03-10 | Disposition: A | Discharge status: Home / Self Care | Payer: BC Managed Care – PPO | Source: Ambulatory Visit | Attending: Family Medicine | Admitting: Family Medicine

## 2020-03-10 DIAGNOSIS — Z1231 Encounter for screening mammogram for malignant neoplasm of breast: Secondary | ICD-10-CM

## 2020-04-08 ENCOUNTER — Other Ambulatory Visit: Payer: Self-pay | Admitting: Family Medicine

## 2020-05-07 ENCOUNTER — Other Ambulatory Visit: Payer: Self-pay

## 2020-05-07 ENCOUNTER — Encounter: Payer: Self-pay | Admitting: Family Medicine

## 2020-05-07 ENCOUNTER — Ambulatory Visit (INDEPENDENT_AMBULATORY_CARE_PROVIDER_SITE_OTHER): Payer: BC Managed Care – PPO | Admitting: Family Medicine

## 2020-05-07 VITALS — BP 120/80 | Ht 65.0 in | Wt 148.0 lb

## 2020-05-07 DIAGNOSIS — N3000 Acute cystitis without hematuria: Secondary | ICD-10-CM

## 2020-05-07 LAB — POCT URINALYSIS DIP (CLINITEK)
Bilirubin, UA: NEGATIVE
Glucose, UA: NEGATIVE mg/dL
Ketones, POC UA: NEGATIVE mg/dL
Nitrite, UA: POSITIVE — AB
POC PROTEIN,UA: NEGATIVE
Spec Grav, UA: 1.02 (ref 1.010–1.025)
Urobilinogen, UA: 0.2 E.U./dL
pH, UA: 7.5 (ref 5.0–8.0)

## 2020-05-07 MED ORDER — CIPROFLOXACIN HCL 250 MG PO TABS: 250.0000 mg | ORAL_TABLET | Freq: Two times a day (BID) | ORAL | 0 refills | Status: DC

## 2020-05-07 NOTE — Assessment & Plan Note (Addendum)
Treatment for urinary tract infection provided today sent out for culture will adjust antibiotics as needed.  Encourage good hygiene in addition to increasing water.   Reviewed side effects, risks and benefits of medication.   Patient acknowledged agreement and understanding of the plan.

## 2020-05-07 NOTE — Patient Instructions (Signed)
I appreciate the opportunity to provide you with care for your health and wellness. Today we discussed: Urinary tract infection  Follow up: As scheduled  No labs or referrals today  Please take Cipro as directed on the container.  If medication needs to be adjusted we will call you. Increase water intake. Practice good hygiene. Avoid bubble baths and other hygiene products near your private areas.  Please continue to practice social distancing to keep you, your family, and our community safe.  If you must go out, please wear a mask and practice good handwashing.  It was a pleasure to see you and I look forward to continuing to work together on your health and well-being. Please do not hesitate to call the office if you need care or have questions about your care.  Have a wonderful day and week. With Gratitude, Cherly Beach, DNP, AGNP-BC

## 2020-05-07 NOTE — Progress Notes (Signed)
Virtual Visit via Telephone Note   This visit type was conducted due to national recommendations for restrictions regarding the COVID-19 Pandemic (e.g. social distancing) in an effort to limit this patient's exposure and mitigate transmission in our community.  Due to her co-morbid illnesses, this patient is at least at moderate risk for complications without adequate follow up.  This format is felt to be most appropriate for this patient at this time.  The patient did not have access to video technology/had technical difficulties with video requiring transitioning to audio format only (telephone).  All issues noted in this document were discussed and addressed.  No physical exam could be performed with this format.    Evaluation Performed:  Follow-up visit  Date:  05/07/2020   ID:  Paula Randolph, DOB 1961-03-29, MRN [id number]  Patient Location: Home Provider Location: Office  Location of Patient: Home Location of Provider: Telehealth Consent was obtain for visit to be over via telehealth. I verified that I am speaking with the correct person using two identifiers.  PCP:  Fayrene Helper, MD   Chief Complaint: Urinary tract infection  History of Present Illness:    Paula Randolph is a 59 y.o. female with 1-1/2 days of increased burning with urination, increased frequency of urination, increased odor of urine, urine is cloudy.  Denies having any belly pain, pelvic pain, flank pain, nausea, vomiting, fever or chills.    The patient does not have symptoms concerning for COVID-19 infection (fever, chills, cough, or new shortness of breath).   Past Medical, Surgical, Social History, Allergies, and Medications have been Reviewed.  Past Medical History:  Diagnosis Date  . Allergy   . Breast cancer ([place]) 2008   RT LUMPECTOMY and rad tx for DCIS  . Cancer ([place])    right breast   . Dyslipidemia   . Hypertension   . Overweight(278.02)   . Personal history of radiation therapy  2008   right breast DCIS  . Primary osteoarthritis of both knees   . Radiation 2008   RT BREAST CA  . Right arm pain   . Strain of lumbar spine   . Trochanteric bursitis   . Vitamin D deficiency    Past Surgical History:  Procedure Laterality Date  . ABDOMINAL HYSTERECTOMY     fibroids  . APPENDECTOMY    . BREAST LUMPECTOMY Right 2008   lumpectomy of calcifications in UOQ. DCIS  . BREAST SURGERY Right 2008   partial  . COLONOSCOPY N/A 05/10/2014   Procedure: COLONOSCOPY;  Surgeon: Daneil Dolin, MD;  Location: [facility];  Service: Endoscopy;  Laterality: N/A;  8:30 AM  . CYSTOCELE REPAIR N/A 12/13/2017   Procedure: ANTERIOR REPAIR (CYSTOCELE);  Surgeon: Bjorn Loser, MD;  Location: [facility];  Service: Urology;  Laterality: N/A;  . PUBOVAGINAL SLING N/A 12/13/2017   Procedure: CYSTOSCOPY Gaynelle Arabian;  Surgeon: Bjorn Loser, MD;  Location: [facility];  Service: Urology;  Laterality: N/A;  . right breast     . VAGINAL PROLAPSE REPAIR N/A 12/13/2017   Procedure: VAGINAL VAULT SUSPENSION GRAFT;  Surgeon: Bjorn Loser, MD;  Location: [facility];  Service: Urology;  Laterality: N/A;     Current Meds  Medication Sig  . amLODipine (NORVASC) 5 MG tablet Take 1 tablet by mouth once daily  . triamterene-hydrochlorothiazide (MAXZIDE) 75-50 MG tablet Take 1 tablet by mouth daily.  Marland Kitchen UNABLE TO FIND Masectomy Bras and prosthesis x 6 months Dx:D05.11  . UNABLE TO FIND Mastectomy  bras     Allergies:   Patient has no known allergies.   ROS:   Please see the history of present illness.    All other systems reviewed and are negative.   Labs/Other Tests and Data Reviewed:    Recent Labs: 01/04/2020: BUN 16; Creat 0.69; Potassium 3.8; Sodium 142; TSH 1.27   Recent Lipid Panel Lab Results  Component Value Date/Time   CHOL 225 (H) 01/18/2018 11:18 AM   TRIG 60 01/18/2018 11:18 AM   HDL 93 01/18/2018 11:18 AM   CHOLHDL 2.4 01/18/2018 11:18 AM   LDLCALC 117 (H) 01/18/2018  11:18 AM    Wt Readings from Last 3 Encounters:  05/07/20 148 lb (67.1 kg)  01/16/20 148 lb (67.1 kg)  10/22/19 147 lb (66.7 kg)     Objective:    Vital Signs:  BP 120/80   Ht 5\' 5"  (1.651 m)   Wt 148 lb (67.1 kg)   BMI 24.63 kg/m    VITAL SIGNS:  reviewed GEN:  Alert and oriented RESPIRATORY:  No shortness of breath noted in conversation PSYCH:  Normal affect and mood  ASSESSMENT & PLAN:     1. Acute cystitis without hematuria  - POCT URINALYSIS DIP (CLINITEK) - Urine Culture - ciprofloxacin (CIPRO) 250 MG tablet; Take 1 tablet (250 mg total) by mouth 2 (two) times daily.  Dispense: 6 tablet; Refill: 0    Time:   Today, I have spent 7 minutes with the patient with telehealth technology discussing the above problems.     Medication Adjustments/Labs and Tests Ordered: Current medicines are reviewed at length with the patient today.  Concerns regarding medicines are outlined above.   Tests Ordered: Orders Placed This Encounter  Procedures  . Urine Culture  . POCT URINALYSIS DIP (CLINITEK)    Medication Changes: No orders of the defined types were placed in this encounter.   Disposition:  Follow up 07/22/2020  Signed, Perlie Mayo, NP  05/07/2020 3:35 PM     [facility]

## 2020-05-08 ENCOUNTER — Other Ambulatory Visit (HOSPITAL_COMMUNITY)
Admission: RE | Admit: 2020-05-08 | Discharge: 2020-05-08 | Disposition: A | Discharge status: Home / Self Care | Payer: BC Managed Care – PPO | Source: Other Acute Inpatient Hospital | Attending: Family Medicine | Admitting: Family Medicine

## 2020-05-08 DIAGNOSIS — N3000 Acute cystitis without hematuria: Secondary | ICD-10-CM | POA: Insufficient documentation

## 2020-05-10 LAB — URINE CULTURE: Culture: >=100000 — AB

## 2020-05-12 ENCOUNTER — Telehealth: Payer: Self-pay | Admitting: *Deleted

## 2020-05-12 NOTE — Telephone Encounter (Signed)
Pt said the antibiotic that was given to her last wed for uti did not work she has thrown them away and wants to know what you would recommend now?

## 2020-05-13 ENCOUNTER — Other Ambulatory Visit: Payer: Self-pay | Admitting: Family Medicine

## 2020-05-13 DIAGNOSIS — N3000 Acute cystitis without hematuria: Secondary | ICD-10-CM

## 2020-05-13 MED ORDER — SULFAMETHOXAZOLE-TRIMETHOPRIM 800-160 MG PO TABS: 1.0000 | ORAL_TABLET | Freq: Two times a day (BID) | ORAL | 0 refills | Status: AC

## 2020-05-13 NOTE — Telephone Encounter (Signed)
Pt notified with verbal understanding  °

## 2020-05-13 NOTE — Telephone Encounter (Signed)
NA NVM will retry call

## 2020-05-13 NOTE — Telephone Encounter (Signed)
Please let her know I will send in a new medication for her to take. Push fluids and follow up after this med if not better.

## 2020-07-22 ENCOUNTER — Ambulatory Visit (INDEPENDENT_AMBULATORY_CARE_PROVIDER_SITE_OTHER): Payer: BC Managed Care – PPO | Admitting: Family Medicine

## 2020-07-22 ENCOUNTER — Encounter: Payer: Self-pay | Admitting: Family Medicine

## 2020-07-22 ENCOUNTER — Other Ambulatory Visit: Payer: Self-pay

## 2020-07-22 VITALS — BP 130/70 | HR 80 | Resp 16 | Ht 65.0 in | Wt 157.0 lb

## 2020-07-22 DIAGNOSIS — I10 Essential (primary) hypertension: Secondary | ICD-10-CM

## 2020-07-22 DIAGNOSIS — E785 Hyperlipidemia, unspecified: Secondary | ICD-10-CM | POA: Diagnosis not present

## 2020-07-22 DIAGNOSIS — Z0000 Encounter for general adult medical examination without abnormal findings: Secondary | ICD-10-CM

## 2020-07-22 DIAGNOSIS — E559 Vitamin D deficiency, unspecified: Secondary | ICD-10-CM

## 2020-07-22 DIAGNOSIS — Z23 Encounter for immunization: Secondary | ICD-10-CM | POA: Diagnosis not present

## 2020-07-22 DIAGNOSIS — M205X2 Other deformities of toe(s) (acquired), left foot: Secondary | ICD-10-CM | POA: Diagnosis not present

## 2020-07-22 MED ORDER — AMLODIPINE BESYLATE 5 MG PO TABS: 5.0000 mg | ORAL_TABLET | Freq: Every day | ORAL | 2 refills | Status: DC

## 2020-07-22 MED ORDER — TRIAMTERENE-HCTZ 75-50 MG PO TABS: 1.0000 | ORAL_TABLET | Freq: Every day | ORAL | 1 refills | Status: DC

## 2020-07-22 NOTE — Progress Notes (Signed)
    Paula Randolph     MRN: [id number]      DOB: September 29, 1961  HPI: Patient is in for annual physical exam. No other health concerns are expressed or addressed at the visit. Recent labs, if available are reviewed. Immunization is reviewed , and  updated if needed.   PE: BP (!) 130/70   Pulse 80   Resp 16   Ht 5\' 5"  (1.651 m)   Wt 157 lb 0.6 oz (71.2 kg)   SpO2 98%   BMI 26.13 kg/m  Pleasant  female, alert and oriented x 3, in no cardio-pulmonary distress. Afebrile. HEENT No facial trauma or asymetry. Sinuses non tender.  Extra occullar muscles intact.. External ears normal, . Neck: supple, no adenopathy,JVD or thyromegaly.No bruits.  Chest: Clear to ascultation bilaterally.No crackles or wheezes. Non tender to palpation  Breast: right mastectomy, no axillary or supraclavicular adenopathy Left breast: no mass no nipple d/c or inversion, no supraclavicular or axillary adenopathy  Cardiovascular system; Heart sounds normal,  S1 and  S2 ,no S3.  No murmur, or thrill. Apical beat not displaced Peripheral pulses normal.  Abdomen: Soft, non tender, no organomegaly or masses. No bruits. Bowel sounds normal. No guarding, tenderness or rebound.    Musculoskeletal exam: Full ROM of spine, hips , shoulders and knees. No deformity ,swelling or crepitus noted. No muscle wasting or atrophy.  Left claw toe  Neurologic: Cranial nerves 2 to 12 intact. Power, tone ,sensation and reflexes normal throughout. No disturbance in gait. No tremor.  Skin: Intact, no ulceration, erythema , scaling or rash noted. Pigmentation normal throughout  Psych; Normal mood and affect. Judgement and concentration normal   Assessment & Plan:  Annual physical exam Annual exam as documented. Counseling done  re healthy lifestyle involving commitment to 150 minutes exercise per week, heart healthy diet, and attaining healthy weight.The importance of adequate sleep also discussed. Regular seat  belt use and home safety, is also discussed. Changes in health habits are decided on by the patient with goals and time frames  set for achieving them. Immunization and cancer screening needs are specifically addressed at this visit.   Need for shingles vaccine After obtaining informed consent, the vaccine is  administered , with no adverse effect noted at the time of administration.   CT (claw toe), left Left claw toe refer Podiatry

## 2020-07-22 NOTE — Patient Instructions (Addendum)
In office with MD in 6 months, call if you need me before   Please get eye exam vision is not 100 %  Shingrix #2  today in office  Fasting lipid, cmp and EGFr, cBC, and vit D in the next 1 week  You are referred to Podiatry for left claw toe  It is important that you exercise regularly at least 30 minutes 5 times a week. If you develop chest pain, have severe difficulty breathing, or feel very tired, stop exercising immediately and seek medical attention   Think about what you will eat, plan ahead. Choose " clean, green, fresh or frozen" over canned, processed or packaged foods which are more sugary, salty and fatty. 70 to 75% of food eaten should be vegetables and fruit. Three meals at set times with snacks allowed between meals, but they must be fruit or vegetables. Aim to eat over a 12 hour period , example 7 am to 7 pm, and STOP after  your last meal of the day. Drink water,generally about 64 ounces per day, no other drink is as healthy. Fruit juice is best enjoyed in a healthy way, by EATING the fruit. Thanks for choosing [facility], we consider it a privelige to serve you.

## 2020-07-22 NOTE — Assessment & Plan Note (Signed)
After obtaining informed consent, the vaccine is  administered , with no adverse effect noted at the time of administration.  

## 2020-07-22 NOTE — Assessment & Plan Note (Signed)

## 2020-07-26 ENCOUNTER — Encounter: Payer: Self-pay | Admitting: Family Medicine

## 2020-07-26 NOTE — Assessment & Plan Note (Signed)
Left claw toe refer Podiatry

## 2020-07-30 DIAGNOSIS — E559 Vitamin D deficiency, unspecified: Secondary | ICD-10-CM | POA: Diagnosis not present

## 2020-07-30 DIAGNOSIS — E785 Hyperlipidemia, unspecified: Secondary | ICD-10-CM | POA: Diagnosis not present

## 2020-07-30 DIAGNOSIS — I10 Essential (primary) hypertension: Secondary | ICD-10-CM | POA: Diagnosis not present

## 2020-07-31 LAB — CBC
Hematocrit: 37.4 % (ref 34.0–46.6)
Hemoglobin: 11.6 g/dL (ref 11.1–15.9)
MCH: 26.4 pg — ABNORMAL LOW (ref 26.6–33.0)
MCHC: 31 g/dL — ABNORMAL LOW (ref 31.5–35.7)
MCV: 85 fL (ref 79–97)
Platelets: 382 10*3/uL (ref 150–450)
RBC: 4.39 x10E6/uL (ref 3.77–5.28)
RDW: 11.4 % — ABNORMAL LOW (ref 11.7–15.4)
WBC: 6.9 10*3/uL (ref 3.4–10.8)

## 2020-07-31 LAB — CMP14+EGFR
ALT: 15 IU/L (ref 0–32)
AST: 17 IU/L (ref 0–40)
Albumin/Globulin Ratio: 1.5 (ref 1.2–2.2)
Albumin: 4.7 g/dL (ref 3.8–4.9)
Alkaline Phosphatase: 118 IU/L (ref 48–121)
BUN/Creatinine Ratio: 20 (ref 12–28)
BUN: 15 mg/dL (ref 8–27)
Bilirubin Total: 0.5 mg/dL (ref 0.0–1.2)
CO2: 26 mmol/L (ref 20–29)
Calcium: 10.1 mg/dL (ref 8.7–10.3)
Chloride: 98 mmol/L (ref 96–106)
Creatinine, Ser: 0.76 mg/dL (ref 0.57–1.00)
GFR calc Af Amer: 99 mL/min/{1.73_m2} (ref >59)
GFR calc non Af Amer: 86 mL/min/{1.73_m2} (ref >59)
Globulin, Total: 3.1 g/dL (ref 1.5–4.5)
Glucose: 97 mg/dL (ref 65–99)
Potassium: 4 mmol/L (ref 3.5–5.2)
Sodium: 139 mmol/L (ref 134–144)
Total Protein: 7.8 g/dL (ref 6.0–8.5)

## 2020-07-31 LAB — LIPID PANEL
Chol/HDL Ratio: 2.4 ratio (ref 0.0–4.4)
Cholesterol, Total: 241 mg/dL — ABNORMAL HIGH (ref 100–199)
HDL: 99 mg/dL (ref >39)
LDL Chol Calc (NIH): 132 mg/dL — ABNORMAL HIGH (ref 0–99)
Triglycerides: 61 mg/dL (ref 0–149)
VLDL Cholesterol Cal: 10 mg/dL (ref 5–40)

## 2020-07-31 LAB — VITAMIN D 25 HYDROXY (VIT D DEFICIENCY, FRACTURES): Vit D, 25-Hydroxy: 38.1 ng/mL (ref 30.0–100.0)

## 2020-09-12 ENCOUNTER — Ambulatory Visit (INDEPENDENT_AMBULATORY_CARE_PROVIDER_SITE_OTHER): Payer: BC Managed Care – PPO

## 2020-09-12 ENCOUNTER — Other Ambulatory Visit: Payer: Self-pay

## 2020-09-12 DIAGNOSIS — Z23 Encounter for immunization: Secondary | ICD-10-CM

## 2021-01-01 DIAGNOSIS — M542 Cervicalgia: Secondary | ICD-10-CM | POA: Diagnosis not present

## 2021-01-20 ENCOUNTER — Encounter: Payer: Self-pay | Admitting: Family Medicine

## 2021-01-20 ENCOUNTER — Other Ambulatory Visit: Payer: Self-pay

## 2021-01-20 ENCOUNTER — Telehealth (INDEPENDENT_AMBULATORY_CARE_PROVIDER_SITE_OTHER): Payer: BC Managed Care – PPO | Admitting: Family Medicine

## 2021-01-20 VITALS — BP 126/80 | Ht 65.0 in | Wt 156.0 lb

## 2021-01-20 DIAGNOSIS — I10 Essential (primary) hypertension: Secondary | ICD-10-CM | POA: Diagnosis not present

## 2021-01-20 DIAGNOSIS — E785 Hyperlipidemia, unspecified: Secondary | ICD-10-CM

## 2021-01-20 DIAGNOSIS — E663 Overweight: Secondary | ICD-10-CM

## 2021-01-20 DIAGNOSIS — E559 Vitamin D deficiency, unspecified: Secondary | ICD-10-CM | POA: Diagnosis not present

## 2021-01-20 NOTE — Progress Notes (Signed)
Virtual Visit via Telephone Note  I connected with Paula Randolph on 01/20/21 at  2:40 PM EST by telephone and verified that I am speaking with the correct person using two identifiers.  Location: Patient: home Provider: work   I discussed the limitations, risks, security and privacy concerns of performing an evaluation and management service by telephone and the availability of in person appointments. I also discussed with the patient that there may be a patient responsible charge related to this service. The patient expressed understanding and agreed to proceed.   History of Present Illness: F/U chronic problems No complaints or concerns, doing well overall Denies recent fever or chills. Denies sinus pressure, nasal congestion, ear pain or sore throat. Denies chest congestion, productive cough or wheezing. Denies chest pains, palpitations and leg swelling Denies abdominal pain, nausea, vomiting,diarrhea or constipation.   Denies dysuria, frequency, hesitancy or incontinence. Denies joint pain, swelling and limitation in mobility. Denies headaches, seizures, numbness, or tingling. Denies depression, anxiety or insomnia. Denies skin break down or rash.      Observations/Objective:  BP 126/80   Ht 5\' 5"  (1.651 m)   Wt 156 lb (70.8 kg)   BMI 25.96 kg/m  Good communication with no confusion and intact memory. Alert and oriented x 3 No signs of respiratory distress during speech   Assessment and Plan:  Essential hypertension Controlled, no change in medication DASH diet and commitment to daily physical activity for a minimum of 30 minutes discussed and encouraged, as a part of hypertension management. The importance of attaining a healthy weight is also discussed.  BP/Weight 01/20/2021 07/22/2020 05/07/2020 01/16/2020 10/22/2019 01/27/70 01/27/9757  Systolic BP 832 549 826 415 830 940 768  Diastolic BP 80 70 80 80 80 76 80  Wt. (Lbs) 156 157.04 148 148 147 153 169  BMI 25.96  26.13 24.63 24.63 24.46 25.46 28.12       Overweight (BMI 25.0-29.9)  Patient re-educated about  the importance of commitment to a  minimum of 150 minutes of exercise per week as able.  The importance of healthy food choices with portion control discussed, as well as eating regularly and within a 12 hour window most days. The need to choose "clean , green" food 50 to 75% of the time is discussed, as well as to make water the primary drink and set a goal of 64 ounces water daily.    Weight /BMI 01/20/2021 07/22/2020 05/07/2020  WEIGHT 156 lb 157 lb 0.6 oz 148 lb  HEIGHT 5\' 5"  5\' 5"  5\' 5"   BMI 25.96 kg/m2 26.13 kg/m2 24.63 kg/m2       Follow Up Instructions:    I discussed the assessment and treatment plan with the patient. The patient was provided an opportunity to ask questions and all were answered. The patient agreed with the plan and demonstrated an understanding of the instructions.   The patient was advised to call back or seek an in-person evaluation if the symptoms worsen or if the condition fails to improve as anticipated.  I provided 11 minutes of non-face-to-face time during this encounter.   Tula Nakayama, MD

## 2021-01-20 NOTE — Patient Instructions (Addendum)
Annual physical exam with pap July 30 or after, call if you need me sooner  Please keep active and follow a low fat diet, your cholesterol is too high  Medications are refilled for an additional 6 months  Fasting labs for next visit , CBC, lipid, cmp and EGFr, TSH and vit D  Covid vaccine info will be documented once we get it  Think about what you will eat, plan ahead. Choose " clean, green, fresh or frozen" over canned, processed or packaged foods which are more sugary, salty and fatty. 70 to 75% of food eaten should be vegetables and fruit. Three meals at set times with snacks allowed between meals, but they must be fruit or vegetables. Aim to eat over a 12 hour period , example 7 am to 7 pm, and STOP after  your last meal of the day. Drink water,generally about 64 ounces per day, no other drink is as healthy. Fruit juice is best enjoyed in a healthy way, by EATING the fruit. Thanks for choosing [facility], we consider it a privelige to serve you.   Marland Kitchen

## 2021-01-23 ENCOUNTER — Encounter: Payer: Self-pay | Admitting: Family Medicine

## 2021-01-23 MED ORDER — TRIAMTERENE-HCTZ 75-50 MG PO TABS: 1.0000 | ORAL_TABLET | Freq: Every day | ORAL | 1 refills | Status: DC

## 2021-01-23 MED ORDER — AMLODIPINE BESYLATE 5 MG PO TABS: 5.0000 mg | ORAL_TABLET | Freq: Every day | ORAL | 1 refills | Status: DC

## 2021-01-23 NOTE — Assessment & Plan Note (Signed)
Controlled, no change in medication DASH diet and commitment to daily physical activity for a minimum of 30 minutes discussed and encouraged, as a part of hypertension management. The importance of attaining a healthy weight is also discussed.  BP/Weight 01/20/2021 07/22/2020 05/07/2020 01/16/2020 10/22/2019 09/24/2445 02/04/6380  Systolic BP 771 165 790 383 338 329 191  Diastolic BP 80 70 80 80 80 76 80  Wt. (Lbs) 156 157.04 148 148 147 153 169  BMI 25.96 26.13 24.63 24.63 24.46 25.46 28.12

## 2021-01-23 NOTE — Assessment & Plan Note (Signed)
  Patient re-educated about  the importance of commitment to a  minimum of 150 minutes of exercise per week as able.  The importance of healthy food choices with portion control discussed, as well as eating regularly and within a 12 hour window most days. The need to choose "clean , green" food 50 to 75% of the time is discussed, as well as to make water the primary drink and set a goal of 64 ounces water daily.    Weight /BMI 01/20/2021 07/22/2020 05/07/2020  WEIGHT 156 lb 157 lb 0.6 oz 148 lb  HEIGHT 5\' 5"  5\' 5"  5\' 5"   BMI 25.96 kg/m2 26.13 kg/m2 24.63 kg/m2

## 2021-02-10 ENCOUNTER — Other Ambulatory Visit (HOSPITAL_COMMUNITY): Payer: Self-pay | Admitting: Family Medicine

## 2021-02-10 DIAGNOSIS — Z1231 Encounter for screening mammogram for malignant neoplasm of breast: Secondary | ICD-10-CM

## 2021-03-16 ENCOUNTER — Ambulatory Visit (HOSPITAL_COMMUNITY)
Admission: RE | Admit: 2021-03-16 | Discharge: 2021-03-16 | Disposition: A | Discharge status: Home / Self Care | Payer: BC Managed Care – PPO | Source: Ambulatory Visit | Attending: Family Medicine | Admitting: Family Medicine

## 2021-03-16 DIAGNOSIS — Z1231 Encounter for screening mammogram for malignant neoplasm of breast: Secondary | ICD-10-CM | POA: Diagnosis not present

## 2021-07-21 ENCOUNTER — Encounter: Payer: BC Managed Care – PPO | Admitting: Family Medicine

## 2021-07-21 DIAGNOSIS — I10 Essential (primary) hypertension: Secondary | ICD-10-CM | POA: Diagnosis not present

## 2021-07-21 DIAGNOSIS — E559 Vitamin D deficiency, unspecified: Secondary | ICD-10-CM | POA: Diagnosis not present

## 2021-07-21 DIAGNOSIS — E785 Hyperlipidemia, unspecified: Secondary | ICD-10-CM | POA: Diagnosis not present

## 2021-07-22 LAB — TSH: TSH: 1.32 u[IU]/mL (ref 0.450–4.500)

## 2021-07-22 LAB — CBC
Hematocrit: 35.5 % (ref 34.0–46.6)
Hemoglobin: 11.3 g/dL (ref 11.1–15.9)
MCH: 26.8 pg (ref 26.6–33.0)
MCHC: 31.8 g/dL (ref 31.5–35.7)
MCV: 84 fL (ref 79–97)
Platelets: 373 10*3/uL (ref 150–450)
RBC: 4.22 x10E6/uL (ref 3.77–5.28)
RDW: 12.1 % (ref 11.7–15.4)
WBC: 7.2 10*3/uL (ref 3.4–10.8)

## 2021-07-22 LAB — CMP14+EGFR
ALT: 11 IU/L (ref 0–32)
AST: 14 IU/L (ref 0–40)
Albumin/Globulin Ratio: 1.5 (ref 1.2–2.2)
Albumin: 4.3 g/dL (ref 3.8–4.8)
Alkaline Phosphatase: 112 IU/L (ref 44–121)
BUN/Creatinine Ratio: 26 (ref 12–28)
BUN: 17 mg/dL (ref 8–27)
Bilirubin Total: 0.3 mg/dL (ref 0.0–1.2)
CO2: 24 mmol/L (ref 20–29)
Calcium: 9.8 mg/dL (ref 8.7–10.3)
Chloride: 100 mmol/L (ref 96–106)
Creatinine, Ser: 0.66 mg/dL (ref 0.57–1.00)
Globulin, Total: 2.8 g/dL (ref 1.5–4.5)
Glucose: 90 mg/dL (ref 65–99)
Potassium: 3.8 mmol/L (ref 3.5–5.2)
Sodium: 145 mmol/L — ABNORMAL HIGH (ref 134–144)
Total Protein: 7.1 g/dL (ref 6.0–8.5)
eGFR: 100 mL/min/{1.73_m2} (ref >59)

## 2021-07-22 LAB — VITAMIN D 25 HYDROXY (VIT D DEFICIENCY, FRACTURES): Vit D, 25-Hydroxy: 47.2 ng/mL (ref 30.0–100.0)

## 2021-07-22 LAB — LIPID PANEL
Chol/HDL Ratio: 2.8 ratio (ref 0.0–4.4)
Cholesterol, Total: 205 mg/dL — ABNORMAL HIGH (ref 100–199)
HDL: 72 mg/dL (ref >39)
LDL Chol Calc (NIH): 116 mg/dL — ABNORMAL HIGH (ref 0–99)
Triglycerides: 98 mg/dL (ref 0–149)
VLDL Cholesterol Cal: 17 mg/dL (ref 5–40)

## 2021-07-27 ENCOUNTER — Encounter: Payer: BC Managed Care – PPO | Admitting: Family Medicine

## 2021-07-31 ENCOUNTER — Encounter: Payer: Self-pay | Admitting: Family Medicine

## 2021-07-31 ENCOUNTER — Ambulatory Visit (INDEPENDENT_AMBULATORY_CARE_PROVIDER_SITE_OTHER): Payer: BC Managed Care – PPO | Admitting: Family Medicine

## 2021-07-31 ENCOUNTER — Other Ambulatory Visit (HOSPITAL_COMMUNITY)
Admission: RE | Admit: 2021-07-31 | Discharge: 2021-07-31 | Disposition: A | Discharge status: Home / Self Care | Payer: BC Managed Care – PPO | Source: Ambulatory Visit | Attending: Family Medicine | Admitting: Family Medicine

## 2021-07-31 ENCOUNTER — Other Ambulatory Visit: Payer: Self-pay

## 2021-07-31 VITALS — BP 138/80 | HR 77 | Ht 63.0 in | Wt 159.8 lb

## 2021-07-31 DIAGNOSIS — I10 Essential (primary) hypertension: Secondary | ICD-10-CM | POA: Diagnosis not present

## 2021-07-31 DIAGNOSIS — Z0001 Encounter for general adult medical examination with abnormal findings: Secondary | ICD-10-CM

## 2021-07-31 DIAGNOSIS — Z1151 Encounter for screening for human papillomavirus (HPV): Secondary | ICD-10-CM

## 2021-07-31 DIAGNOSIS — E785 Hyperlipidemia, unspecified: Secondary | ICD-10-CM

## 2021-07-31 DIAGNOSIS — Z0000 Encounter for general adult medical examination without abnormal findings: Secondary | ICD-10-CM

## 2021-07-31 MED ORDER — AMLODIPINE BESYLATE 5 MG PO TABS: 5.0000 mg | ORAL_TABLET | Freq: Every day | ORAL | 1 refills | Status: DC

## 2021-07-31 MED ORDER — TRIAMTERENE-HCTZ 75-50 MG PO TABS: 1.0000 | ORAL_TABLET | Freq: Every day | ORAL | 1 refills | Status: DC

## 2021-07-31 NOTE — Assessment & Plan Note (Signed)

## 2021-07-31 NOTE — Patient Instructions (Signed)
F./U in 5.5  months, call if you need me before  Congrats on good health habits, continue to reduce fried and fatty foods, cholesterol is getting better  Flu vaccine between September and October  Fasting lipid, chem 7 and EGFr 1 week before follow up  It is important that you exercise regularly at least 30 minutes 5 times a week. If you develop chest pain, have severe difficulty breathing, or feel very tired, stop exercising immediately and seek medical attention    Thanks for choosing [facility], we consider it a privelige to serve you.

## 2021-07-31 NOTE — Progress Notes (Signed)
    Paula Randolph     MRN: [id number]      DOB: 12-13-1961  HPI: Patient is in for annual physical exam. No other health concerns are expressed or addressed at the visit. Recent labs,  are reviewed. Immunization is reviewed , and  updated if needed.   PE: BP 138/80   Pulse 77   Ht '5\' 3"'$  (1.6 m)   Wt 159 lb 12 oz (72.5 kg)   SpO2 98%   BMI 28.30 kg/m   Pleasant  female, alert and oriented x 3, in no cardio-pulmonary distress. Afebrile. HEENT No facial trauma or asymetry. Sinuses non tender.  Extra occullar muscles intact.. External ears normal, . Neck: supple, no adenopathy,JVD or thyromegaly.No bruits.  Chest: Clear to ascultation bilaterally.No crackles or wheezes. Non tender to palpation  Breast: asymmetry,no masses or lumps. No tenderness. No nipple discharge or inversion. No axillary or supraclavicular adenopathy  Cardiovascular system; Heart sounds normal,  S1 and  S2 ,no S3.  No murmur, or thrill. Apical beat not displaced Peripheral pulses normal.  Abdomen: Soft, non tender, no organomegaly or masses. No bruits. Bowel sounds normal. No guarding, tenderness or rebound.   GU: External genitalia normal female genitalia , normal female distribution of hair. No lesions. Urethral meatus normal in size, bladder   Prolapse,   Present. Bladder non tender. Vagina pink , uterus absent no adnexal masses, no  adnexal tenderness.   Musculoskeletal exam: Full ROM of spine, hips , shoulders and knees. No deformity ,swelling or crepitus noted. No muscle wasting or atrophy.   Neurologic: Cranial nerves 2 to 12 intact. Power, tone ,sensation and reflexes normal throughout. No disturbance in gait. No tremor.  Skin: Intact, no ulceration, erythema , scaling or rash noted. Pigmentation normal throughout  Psych; Normal mood and affect. Judgement and concentration normal   Assessment & Plan:  Annual physical exam Annual exam as documented. Counseling done  re  healthy lifestyle involving commitment to 150 minutes exercise per week, heart healthy diet, and attaining healthy weight.The importance of adequate sleep also discussed. Regular seat belt use and home safety, is also discussed. Changes in health habits are decided on by the patient with goals and time frames  set for achieving them. Immunization and cancer screening needs are specifically addressed at this visit.

## 2021-08-04 LAB — CYTOLOGY - PAP
Comment: NEGATIVE
Diagnosis: NEGATIVE
High risk HPV: NEGATIVE

## 2021-10-19 ENCOUNTER — Ambulatory Visit (INDEPENDENT_AMBULATORY_CARE_PROVIDER_SITE_OTHER): Payer: BC Managed Care – PPO

## 2021-10-19 ENCOUNTER — Other Ambulatory Visit: Payer: Self-pay

## 2021-10-19 DIAGNOSIS — Z23 Encounter for immunization: Secondary | ICD-10-CM

## 2022-01-07 DIAGNOSIS — E785 Hyperlipidemia, unspecified: Secondary | ICD-10-CM | POA: Diagnosis not present

## 2022-01-07 DIAGNOSIS — I10 Essential (primary) hypertension: Secondary | ICD-10-CM | POA: Diagnosis not present

## 2022-01-08 LAB — BASIC METABOLIC PANEL
BUN/Creatinine Ratio: 25 (ref 12–28)
BUN: 18 mg/dL (ref 8–27)
CO2: 25 mmol/L (ref 20–29)
Calcium: 9.8 mg/dL (ref 8.7–10.3)
Chloride: 104 mmol/L (ref 96–106)
Creatinine, Ser: 0.71 mg/dL (ref 0.57–1.00)
Glucose: 89 mg/dL (ref 70–99)
Potassium: 3.8 mmol/L (ref 3.5–5.2)
Sodium: 143 mmol/L (ref 134–144)
eGFR: 97 mL/min/{1.73_m2} (ref >59)

## 2022-01-08 LAB — LIPID PANEL
Chol/HDL Ratio: 2.7 ratio (ref 0.0–4.4)
Cholesterol, Total: 228 mg/dL — ABNORMAL HIGH (ref 100–199)
HDL: 85 mg/dL (ref >39)
LDL Chol Calc (NIH): 131 mg/dL — ABNORMAL HIGH (ref 0–99)
Triglycerides: 70 mg/dL (ref 0–149)
VLDL Cholesterol Cal: 12 mg/dL (ref 5–40)

## 2022-01-14 ENCOUNTER — Ambulatory Visit: Payer: BC Managed Care – PPO | Admitting: Family Medicine

## 2022-01-14 ENCOUNTER — Other Ambulatory Visit: Payer: Self-pay

## 2022-01-14 ENCOUNTER — Encounter: Payer: Self-pay | Admitting: Family Medicine

## 2022-01-14 VITALS — BP 131/85 | HR 75 | Ht 65.0 in | Wt 164.1 lb

## 2022-01-14 DIAGNOSIS — I10 Essential (primary) hypertension: Secondary | ICD-10-CM | POA: Diagnosis not present

## 2022-01-14 DIAGNOSIS — R8290 Unspecified abnormal findings in urine: Secondary | ICD-10-CM | POA: Insufficient documentation

## 2022-01-14 DIAGNOSIS — E663 Overweight: Secondary | ICD-10-CM

## 2022-01-14 DIAGNOSIS — Z1231 Encounter for screening mammogram for malignant neoplasm of breast: Secondary | ICD-10-CM

## 2022-01-14 DIAGNOSIS — N3000 Acute cystitis without hematuria: Secondary | ICD-10-CM

## 2022-01-14 DIAGNOSIS — E559 Vitamin D deficiency, unspecified: Secondary | ICD-10-CM

## 2022-01-14 DIAGNOSIS — E785 Hyperlipidemia, unspecified: Secondary | ICD-10-CM

## 2022-01-14 LAB — POCT URINALYSIS DIP (CLINITEK)
Bilirubin, UA: NEGATIVE
Blood, UA: NEGATIVE
Glucose, UA: NEGATIVE mg/dL
Ketones, POC UA: NEGATIVE mg/dL
Nitrite, UA: POSITIVE — AB
POC PROTEIN,UA: NEGATIVE
Spec Grav, UA: 1.02 (ref 1.010–1.025)
Urobilinogen, UA: 0.2 E.U./dL
pH, UA: 6.5 (ref 5.0–8.0)

## 2022-01-14 NOTE — Patient Instructions (Signed)
Annual exam end August , call if you need me sooner ° °Increase water intake ° °Commit to exercise 5 days per week for 30 mins per session ° °CCUA today  ° °Reduce fried and fatty foods ° °Please schedule mammogram at checkout ° °CBC, fasting lipid, cmp and EGFr, tSH and vit D 5 days before August appointment ° °HAPPY BIRTHDAY!! ° °Thanks for choosing [facility], we consider it a privelige to serve you. ° °

## 2022-01-14 NOTE — Assessment & Plan Note (Signed)
Controlled, no change in medication DASH diet and commitment to daily physical activity for a minimum of 30 minutes discussed and encouraged, as a part of hypertension management. The importance of attaining a healthy weight is also discussed.  BP/Weight 01/14/2022 07/31/2021 01/20/2021 07/22/2020 05/07/2020 01/16/2020 01/17/2410  Systolic BP 464 314 276 701 100 349 611  Diastolic BP 85 80 80 70 80 80 80  Wt. (Lbs) 164.08 159.75 156 157.04 148 148 147  BMI 27.3 28.3 25.96 26.13 24.63 24.63 24.46

## 2022-01-14 NOTE — Assessment & Plan Note (Signed)
°  Patient re-educated about  the importance of commitment to a  minimum of 150 minutes of exercise per week as able.  The importance of healthy food choices with portion control discussed, as well as eating regularly and within a 12 hour window most days. The need to choose "clean , green" food 50 to 75% of the time is discussed, as well as to make water the primary drink and set a goal of 64 ounces water daily.    Weight /BMI 01/14/2022 07/31/2021 01/20/2021  WEIGHT 164 lb 1.3 oz 159 lb 12 oz 156 lb  HEIGHT 5\' 5"  5\' 3"  5\' 5"   BMI 27.3 kg/m2 28.3 kg/m2 25.96 kg/m2

## 2022-01-14 NOTE — Assessment & Plan Note (Signed)
Updated lab needed at/ before next visit.   

## 2022-01-14 NOTE — Progress Notes (Signed)
Paula Randolph     MRN: [id number]      DOB: 1961/06/28   HPI Paula Randolph is here for follow up and re-evaluation of chronic medical conditions, medication management and review of any available recent lab and radiology data.  Preventive health is updated, specifically  Cancer screening and Immunization.   Questions or concerns regarding consultations or procedures which the PT has had in the interim are  addressed. The PT denies any adverse reactions to current medications since the last visit.  C/o malodorous urine and decreased water intake   ROS Denies recent fever or chills. Denies sinus pressure, nasal congestion, ear pain or sore throat. Denies chest congestion, productive cough or wheezing. Denies chest pains, palpitations and leg swelling Denies abdominal pain, nausea, vomiting,diarrhea or constipation.   Denies dysuria, frequency, hesitancy or incontinence. Denies joint pain, swelling and limitation in mobility. Denies headaches, seizures, numbness, or tingling. Denies depression, anxiety or insomnia. Denies skin break down or rash.   PE  BP 131/85    Pulse 75    Ht 5\' 5"  (1.651 m)    Wt 164 lb 1.3 oz (74.4 kg)    BMI 27.30 kg/m   Patient alert and oriented and in no cardiopulmonary distress.  HEENT: No facial asymmetry, EOMI,     Neck supple .  Chest: Clear to auscultation bilaterally.  CVS: S1, S2 no murmurs, no S3.Regular rate.  ABD: Soft non tender.   Ext: No edema  MS: Adequate ROM spine, shoulders, hips and knees.  Skin: Intact, no ulcerations or rash noted.  Psych: Good eye contact, normal affect. Memory intact not anxious or depressed appearing.  CNS: CN 2-12 intact, power,  normal throughout.no focal deficits noted.   Assessment & Plan  Essential hypertension Controlled, no change in medication DASH diet and commitment to daily physical activity for a minimum of 30 minutes discussed and encouraged, as a part of hypertension management. The  importance of attaining a healthy weight is also discussed.  BP/Weight 01/14/2022 07/31/2021 01/20/2021 07/22/2020 05/07/2020 01/16/2020 37/03/8888  Systolic BP 169 450 388 828 003 491 791  Diastolic BP 85 80 80 70 80 80 80  Wt. (Lbs) 164.08 159.75 156 157.04 148 148 147  BMI 27.3 28.3 25.96 26.13 24.63 24.63 24.46       Dyslipidemia Hyperlipidemia:Low fat diet discussed and encouraged.   Lipid Panel  Lab Results  Component Value Date   CHOL 228 (H) 01/07/2022   HDL 85 01/07/2022   LDLCALC 131 (H) 01/07/2022   TRIG 70 01/07/2022   CHOLHDL 2.7 01/07/2022     Need to reduce fried and fatty foods  Overweight (BMI 25.0-29.9)  Patient re-educated about  the importance of commitment to a  minimum of 150 minutes of exercise per week as able.  The importance of healthy food choices with portion control discussed, as well as eating regularly and within a 12 hour window most days. The need to choose "clean , green" food 50 to 75% of the time is discussed, as well as to make water the primary drink and set a goal of 64 ounces water daily.    Weight /BMI 01/14/2022 07/31/2021 01/20/2021  WEIGHT 164 lb 1.3 oz 159 lb 12 oz 156 lb  HEIGHT 5\' 5"  5\' 3"  5\' 5"   BMI 27.3 kg/m2 28.3 kg/m2 25.96 kg/m2      Vitamin D deficiency Updated lab needed at/ before next visit.   Malodorous urine ccua in office, ensure 64 oz water, will treat  when c/s available

## 2022-01-14 NOTE — Assessment & Plan Note (Signed)
Hyperlipidemia:Low fat diet discussed and encouraged.   Lipid Panel  Lab Results  Component Value Date   CHOL 228 (H) 01/07/2022   HDL 85 01/07/2022   LDLCALC 131 (H) 01/07/2022   TRIG 70 01/07/2022   CHOLHDL 2.7 01/07/2022     Need to reduce fried and fatty foods

## 2022-01-14 NOTE — Assessment & Plan Note (Addendum)
ccua in office, ensure 64 oz water, will treat when c/s available

## 2022-01-15 ENCOUNTER — Other Ambulatory Visit: Payer: Self-pay

## 2022-01-15 DIAGNOSIS — N3000 Acute cystitis without hematuria: Secondary | ICD-10-CM

## 2022-01-18 ENCOUNTER — Other Ambulatory Visit: Payer: Self-pay | Admitting: Family Medicine

## 2022-01-18 LAB — URINE CULTURE

## 2022-01-18 MED ORDER — NITROFURANTOIN MONOHYD MACRO 100 MG PO CAPS: 100.0000 mg | ORAL_CAPSULE | Freq: Two times a day (BID) | ORAL | 0 refills | Status: DC

## 2022-01-20 ENCOUNTER — Other Ambulatory Visit: Payer: Self-pay | Admitting: Family Medicine

## 2022-01-21 ENCOUNTER — Other Ambulatory Visit: Payer: Self-pay

## 2022-01-21 LAB — UA/M W/RFLX CULTURE, COMP

## 2022-01-21 LAB — CBC

## 2022-01-21 LAB — LIPID PANEL

## 2022-01-21 LAB — CMP14+EGFR

## 2022-01-21 LAB — SPECIMEN STATUS REPORT

## 2022-01-21 MED ORDER — NITROFURANTOIN MONOHYD MACRO 100 MG PO CAPS: 100.0000 mg | ORAL_CAPSULE | Freq: Two times a day (BID) | ORAL | 0 refills | Status: AC

## 2022-01-26 LAB — CMP14+EGFR

## 2022-01-26 LAB — LIPID PANEL

## 2022-01-26 LAB — VITAMIN D 25 HYDROXY (VIT D DEFICIENCY, FRACTURES)

## 2022-01-26 LAB — UA/M W/RFLX CULTURE, COMP

## 2022-02-09 ENCOUNTER — Telehealth: Payer: Self-pay | Admitting: Family Medicine

## 2022-02-09 ENCOUNTER — Other Ambulatory Visit: Payer: Self-pay

## 2022-02-09 NOTE — Telephone Encounter (Signed)
Pt called in requesting a call back °

## 2022-02-09 NOTE — Telephone Encounter (Signed)
If she believes she still has UTI will need office visit

## 2022-02-09 NOTE — Telephone Encounter (Signed)
Called patient VM is full, if patient calls back will scheduled an appt.

## 2022-02-09 NOTE — Telephone Encounter (Signed)
Said she doesn't think the med for UTI worked. Said she finished it but she is still having the bad urine odor but no other symptoms. Wants to know what she needs to do. Please advise

## 2022-02-09 NOTE — Telephone Encounter (Signed)
Called pt back, VM full and unable to leave message Let me know if she calls back

## 2022-02-11 ENCOUNTER — Other Ambulatory Visit: Payer: Self-pay

## 2022-02-11 ENCOUNTER — Encounter: Payer: Self-pay | Admitting: Family Medicine

## 2022-02-11 ENCOUNTER — Ambulatory Visit: Payer: BC Managed Care – PPO | Admitting: Family Medicine

## 2022-02-11 ENCOUNTER — Other Ambulatory Visit (HOSPITAL_COMMUNITY)
Admission: RE | Admit: 2022-02-11 | Discharge: 2022-02-11 | Disposition: A | Discharge status: Home / Self Care | Payer: BC Managed Care – PPO | Source: Ambulatory Visit | Attending: Family Medicine | Admitting: Family Medicine

## 2022-02-11 VITALS — BP 133/83 | HR 70 | Ht 65.0 in | Wt 162.0 lb

## 2022-02-11 DIAGNOSIS — N3000 Acute cystitis without hematuria: Secondary | ICD-10-CM

## 2022-02-11 DIAGNOSIS — B9689 Other specified bacterial agents as the cause of diseases classified elsewhere: Secondary | ICD-10-CM

## 2022-02-11 DIAGNOSIS — R8290 Unspecified abnormal findings in urine: Secondary | ICD-10-CM | POA: Diagnosis not present

## 2022-02-11 DIAGNOSIS — N760 Acute vaginitis: Secondary | ICD-10-CM

## 2022-02-11 NOTE — Progress Notes (Signed)
° °  Paula Randolph     MRN: [id number]      DOB: [birth year]   HPI Paula Randolph is here c/o persistent urinary odor, after antibiotic management of E coli infection, no fever , chills or flank pain  ROS Denies recent fever or chills. Denies sinus pressure, nasal congestion, ear pain or sore throat. Denies chest congestion, productive cough or wheezing. Denies chest pains, palpitations and leg swelling Denies abdominal pain, nausea, vomiting,diarrhea or constipation.   Denies dysuria, frequency, hesitancy or incontinence. Denies joint pain, swelling and limitation in mobility. Denies headaches, seizures, numbness, or tingling. Denies depression, anxiety or insomnia. Denies skin break down or rash.   PE  BP 133/83    Pulse 70    Ht 5\' 5"  (1.651 m)    Wt 162 lb (73.5 kg)    SpO2 98%    BMI 26.96 kg/m   Patient alert and oriented and in no cardiopulmonary distress.  HEENT: No facial asymmetry, EOMI,     Neck supple .  Chest: Clear to auscultation bilaterally.  CVS: S1, S2 no murmurs, no S3.Regular rate.  ABD: Soft non tender. No renal angle tenderness  Ext: No edema  MS: Adequate ROM spine, shoulders, hips and knees.  Skin: Intact, no ulcerations or rash noted.  Psych: Good eye contact, normal affect. Memory intact not anxious or depressed appearing.  CNS: CN 2-12 intact, power,  normal throughout.no focal deficits noted.   Assessment & Plan  Malodorous urine Persistent urianry odorrinary odoro folloewing recent 5 day course of antibiotic, will send for c/s and STD testing   Bacterial vaginosis Will Treat with metronidazole x 1 week

## 2022-02-11 NOTE — Assessment & Plan Note (Addendum)
Persistent urianry odorrinary odoro folloewing recent 5 day course of antibiotic, will send for c/s and STD testing

## 2022-02-11 NOTE — Patient Instructions (Signed)
Keep August appointment , call if you need me sooner  Urine is being sent for culture only also STD testing   If you do have another or persistent UTI , you will be referred to Urology as well as being treated for the infection  Ensure you drink at least 64 ounces water daily and empty  often  Thanks for choosing [facility], we consider it a privelige to serve you.

## 2022-02-12 DIAGNOSIS — N760 Acute vaginitis: Secondary | ICD-10-CM | POA: Insufficient documentation

## 2022-02-12 DIAGNOSIS — B9689 Other specified bacterial agents as the cause of diseases classified elsewhere: Secondary | ICD-10-CM | POA: Insufficient documentation

## 2022-02-12 LAB — CERVICOVAGINAL ANCILLARY ONLY
Bacterial Vaginitis (gardnerella): POSITIVE — AB
Chlamydia: NEGATIVE
Comment: NEGATIVE
Comment: NEGATIVE
Comment: NEGATIVE
Comment: NORMAL
Neisseria Gonorrhea: NEGATIVE
Trichomonas: NEGATIVE

## 2022-02-12 MED ORDER — METRONIDAZOLE 500 MG PO TABS
500.0000 mg | ORAL_TABLET | Freq: Two times a day (BID) | ORAL | 0 refills | Status: DC
Start: 2022-02-15 — End: 2022-09-15

## 2022-02-13 ENCOUNTER — Encounter: Payer: Self-pay | Admitting: Family Medicine

## 2022-02-13 NOTE — Assessment & Plan Note (Signed)
Will Treat with metronidazole x 1 week

## 2022-02-15 ENCOUNTER — Telehealth: Payer: Self-pay | Admitting: Family Medicine

## 2022-02-15 NOTE — Telephone Encounter (Signed)
Pt returning phone call   Pt will be leaving for work by 2:45

## 2022-02-16 ENCOUNTER — Other Ambulatory Visit: Payer: Self-pay | Admitting: Family Medicine

## 2022-02-16 LAB — URINE CULTURE

## 2022-02-16 MED ORDER — SULFAMETHOXAZOLE-TRIMETHOPRIM 800-160 MG PO TABS: 1.0000 | ORAL_TABLET | Freq: Two times a day (BID) | ORAL | 0 refills | Status: DC

## 2022-03-18 ENCOUNTER — Ambulatory Visit (HOSPITAL_COMMUNITY): Payer: BC Managed Care – PPO

## 2022-03-19 ENCOUNTER — Ambulatory Visit (HOSPITAL_COMMUNITY)
Admission: RE | Admit: 2022-03-19 | Discharge: 2022-03-19 | Disposition: A | Discharge status: Home / Self Care | Payer: BC Managed Care – PPO | Source: Ambulatory Visit | Attending: Family Medicine | Admitting: Family Medicine

## 2022-03-19 ENCOUNTER — Other Ambulatory Visit: Payer: Self-pay

## 2022-03-19 DIAGNOSIS — Z1231 Encounter for screening mammogram for malignant neoplasm of breast: Secondary | ICD-10-CM | POA: Diagnosis not present

## 2022-07-28 ENCOUNTER — Other Ambulatory Visit: Payer: Self-pay | Admitting: Family Medicine

## 2022-08-25 ENCOUNTER — Encounter: Payer: BC Managed Care – PPO | Admitting: Family Medicine

## 2022-09-09 ENCOUNTER — Other Ambulatory Visit: Payer: Self-pay

## 2022-09-09 DIAGNOSIS — E785 Hyperlipidemia, unspecified: Secondary | ICD-10-CM | POA: Diagnosis not present

## 2022-09-10 LAB — BMP8+EGFR
BUN/Creatinine Ratio: 32 — ABNORMAL HIGH (ref 12–28)
BUN: 23 mg/dL (ref 8–27)
CO2: 26 mmol/L (ref 20–29)
Calcium: 10.1 mg/dL (ref 8.7–10.3)
Chloride: 104 mmol/L (ref 96–106)
Creatinine, Ser: 0.73 mg/dL (ref 0.57–1.00)
Glucose: 96 mg/dL (ref 70–99)
Potassium: 4.1 mmol/L (ref 3.5–5.2)
Sodium: 142 mmol/L (ref 134–144)
eGFR: 93 mL/min/{1.73_m2} (ref >59)

## 2022-09-10 LAB — LIPID PANEL
Chol/HDL Ratio: 2.7 ratio (ref 0.0–4.4)
Cholesterol, Total: 224 mg/dL — ABNORMAL HIGH (ref 100–199)
HDL: 84 mg/dL (ref >39)
LDL Chol Calc (NIH): 125 mg/dL — ABNORMAL HIGH (ref 0–99)
Triglycerides: 85 mg/dL (ref 0–149)
VLDL Cholesterol Cal: 15 mg/dL (ref 5–40)

## 2022-09-15 ENCOUNTER — Ambulatory Visit (INDEPENDENT_AMBULATORY_CARE_PROVIDER_SITE_OTHER): Payer: BC Managed Care – PPO | Admitting: Family Medicine

## 2022-09-15 ENCOUNTER — Encounter: Payer: Self-pay | Admitting: Family Medicine

## 2022-09-15 VITALS — BP 156/92 | HR 75 | Ht 65.0 in | Wt 161.1 lb

## 2022-09-15 DIAGNOSIS — R8290 Unspecified abnormal findings in urine: Secondary | ICD-10-CM

## 2022-09-15 DIAGNOSIS — Z0000 Encounter for general adult medical examination without abnormal findings: Secondary | ICD-10-CM | POA: Diagnosis not present

## 2022-09-15 DIAGNOSIS — E785 Hyperlipidemia, unspecified: Secondary | ICD-10-CM

## 2022-09-15 DIAGNOSIS — Z23 Encounter for immunization: Secondary | ICD-10-CM

## 2022-09-15 DIAGNOSIS — I10 Essential (primary) hypertension: Secondary | ICD-10-CM

## 2022-09-15 LAB — POCT URINALYSIS DIP (CLINITEK)
Bilirubin, UA: NEGATIVE
Glucose, UA: NEGATIVE mg/dL
Nitrite, UA: POSITIVE — AB
POC PROTEIN,UA: NEGATIVE
Spec Grav, UA: 1.025 (ref 1.010–1.025)
Urobilinogen, UA: 0.2 E.U./dL
pH, UA: 5.5 (ref 5.0–8.0)

## 2022-09-15 MED ORDER — SULFAMETHOXAZOLE-TRIMETHOPRIM 800-160 MG PO TABS: 1.0000 | ORAL_TABLET | Freq: Two times a day (BID) | ORAL | 0 refills | Status: DC

## 2022-09-15 MED ORDER — AMLODIPINE BESYLATE 5 MG PO TABS: 5.0000 mg | ORAL_TABLET | Freq: Every day | ORAL | 2 refills | Status: DC

## 2022-09-15 NOTE — Assessment & Plan Note (Signed)
Hyperlipidemia:Low fat diet discussed and encouraged.   Lipid Panel  Lab Results  Component Value Date   CHOL 224 (H) 09/09/2022   HDL 84 09/09/2022   LDLCALC 125 (H) 09/09/2022   TRIG 85 09/09/2022   CHOLHDL 2.7 09/09/2022     DEteriorated, pt ed and m,aterial provided

## 2022-09-15 NOTE — Progress Notes (Signed)
     Paula Randolph     MRN: [id number]      DOB: 1961-12-12  HPI: Patient is in for annual physical exam. Malodorous urine x 3 weeks , with no fever or flank pain Out of bP medication x 1 month, we were unaware Immunization is reviewed , and  updated if needed.   PE:BP (!) 154/80 (BP Location: Right Arm, Patient Position: Sitting, Cuff Size: Normal)   Pulse 75   Ht '5\' 5"'$  (1.651 m)   Wt 161 lb 1.9 oz (73.1 kg)   SpO2 98%   BMI 26.81 kg/m   Pleasant  female, alert and oriented x 3, in no cardio-pulmonary distress. Afebrile. HEENT No facial trauma or asymetry. Sinuses non tender.  Extra occullar muscles intact.. External ears normal, . Neck: supple, no adenopathy,JVD or thyromegaly.No bruits.  Chest: Clear to ascultation bilaterally.No crackles or wheezes. Non tender to palpation  Cardiovascular system; Heart sounds normal,  S1 and  S2 ,no S3.  No murmur, or thrill. Apical beat not displaced Peripheral pulses normal.  Abdomen: Soft, non tender, no organomegaly or masses. No bruits. Bowel sounds normal. No guarding, tenderness or rebound.   Musculoskeletal exam: Full ROM of spine, hips , shoulders and knees. No deformity ,swelling or crepitus noted. No muscle wasting or atrophy.   Neurologic: Cranial nerves 2 to 12 intact. Power, tone ,sensation and reflexes normal throughout. No disturbance in gait. No tremor.  Skin: Intact, no ulceration, erythema , scaling or rash noted. Pigmentation normal throughout  Psych; Normal mood and affect. Judgement and concentration normal   Assessment & Plan:  Annual physical exam Annual exam as documented. Counseling done  re healthy lifestyle involving commitment to 150 minutes exercise per week, heart healthy diet, and attaining healthy weight.The importance of adequate sleep also discussed. Regular seat belt use and home safety, is also discussed. Changes in health habits are decided on by the patient with goals and  time frames  set for achieving them. Immunization and cancer screening needs are specifically addressed at this visit.   Malodorous urine 3 week  history, abnm cCUA, frequency, urine for c/s and 5 day course of septra prescribed  Essential hypertension Out of med DASH diet and commitment to daily physical activity for a minimum of 30 minutes discussed and encouraged, as a part of hypertension management. The importance of attaining a healthy weight is also discussed.     09/15/2022    8:32 AM 09/15/2022    8:15 AM 09/15/2022    8:12 AM 02/11/2022    8:31 AM 01/14/2022    8:27 AM 07/31/2021    8:54 AM 07/31/2021    8:23 AM  BP/Weight  Systolic BP 546 503 546 568 127 517 001  Diastolic BP 92 80 89 83 85 80 85  Wt. (Lbs)   161.12 162 164.08  159.75  BMI   26.81 kg/m2 26.96 kg/m2 27.3 kg/m2  28.3 kg/m2     Review in 6 weeks  Dyslipidemia Hyperlipidemia:Low fat diet discussed and encouraged.   Lipid Panel  Lab Results  Component Value Date   CHOL 224 (H) 09/09/2022   HDL 84 09/09/2022   LDLCALC 125 (H) 09/09/2022   TRIG 85 09/09/2022   CHOLHDL 2.7 09/09/2022     DEteriorated, pt ed and m,aterial provided

## 2022-09-15 NOTE — Patient Instructions (Addendum)
F/U in 6 weeks , re evaluate blood pressure, repeat cUUA at visit, call if you need me sooner  Ensure you take the TWO blood pressure meds that you have prescribed for you, blood pressure is high  An antibiotic , septra is prescribed , you appear to have a bladder infection, you will be contacted re result of urine culture , later this week or early neext week   Please follow low fat diet to improve cholesterol  Flu vaccine today  It is important that you exercise regularly at least 30 minutes 5 times a week. If you develop chest pain, have severe difficulty breathing, or feel very tired, stop exercising immediately and seek medical attention   Thanks for choosing [facility], we consider it a privelige to serve you.

## 2022-09-15 NOTE — Assessment & Plan Note (Signed)

## 2022-09-15 NOTE — Assessment & Plan Note (Addendum)
3 week  history, abnm cCUA, frequency, urine for c/s and 5 day course of septra prescribed

## 2022-09-15 NOTE — Assessment & Plan Note (Signed)
Out of med DASH diet and commitment to daily physical activity for a minimum of 30 minutes discussed and encouraged, as a part of hypertension management. The importance of attaining a healthy weight is also discussed.     09/15/2022    8:32 AM 09/15/2022    8:15 AM 09/15/2022    8:12 AM 02/11/2022    8:31 AM 01/14/2022    8:27 AM 07/31/2021    8:54 AM 07/31/2021    8:23 AM  BP/Weight  Systolic BP 914 445 848 350 757 322 567  Diastolic BP 92 80 89 83 85 80 85  Wt. (Lbs)   161.12 162 164.08  159.75  BMI   26.81 kg/m2 26.96 kg/m2 27.3 kg/m2  28.3 kg/m2     Review in 6 weeks

## 2022-09-21 LAB — URINE CULTURE

## 2022-10-27 ENCOUNTER — Encounter: Payer: BC Managed Care – PPO | Admitting: Family Medicine

## 2022-10-29 ENCOUNTER — Ambulatory Visit: Payer: BC Managed Care – PPO | Admitting: Family Medicine

## 2022-10-29 ENCOUNTER — Encounter: Payer: Self-pay | Admitting: Family Medicine

## 2022-10-29 VITALS — BP 120/84 | HR 73 | Ht 65.0 in | Wt 160.1 lb

## 2022-10-29 DIAGNOSIS — I10 Essential (primary) hypertension: Secondary | ICD-10-CM

## 2022-10-29 DIAGNOSIS — E559 Vitamin D deficiency, unspecified: Secondary | ICD-10-CM | POA: Diagnosis not present

## 2022-10-29 DIAGNOSIS — Z1231 Encounter for screening mammogram for malignant neoplasm of breast: Secondary | ICD-10-CM

## 2022-10-29 DIAGNOSIS — E663 Overweight: Secondary | ICD-10-CM

## 2022-10-29 DIAGNOSIS — E785 Hyperlipidemia, unspecified: Secondary | ICD-10-CM

## 2022-10-29 MED ORDER — AMLODIPINE BESYLATE 5 MG PO TABS: 5.0000 mg | ORAL_TABLET | Freq: Every day | ORAL | 3 refills | Status: DC

## 2022-10-29 NOTE — Assessment & Plan Note (Signed)
  Patient re-educated about  the importance of commitment to a  minimum of 150 minutes of exercise per week as able.  The importance of healthy food choices with portion control discussed, as well as eating regularly and within a 12 hour window most days. The need to choose "clean , green" food 50 to 75% of the time is discussed, as well as to make water the primary drink and set a goal of 64 ounces water daily.       10/29/2022    8:16 AM 10/29/2022    8:10 AM 09/15/2022    8:12 AM  Weight /BMI  Weight  160 lb 1.9 oz 161 lb 1.9 oz  Height '5\' 5"'$  (1.651 m) '5\' 6"'$  (1.676 m) '5\' 5"'$  (1.651 m)  BMI 26.65 kg/m2 25.84 kg/m2 26.81 kg/m2

## 2022-10-29 NOTE — Progress Notes (Signed)
Paula Randolph     MRN: [id number]      DOB: [birth year]   HPI [name] is here for follow up and re-evaluation of chronic medical conditions, medication management and review of any available recent lab and radiology data.  Preventive health is updated, specifically  Cancer screening and Immunization.   Questions or concerns regarding consultations or procedures which the PT has had in the interim are  addressed. The PT denies any adverse reactions to current medications since the last visit.  There are no new concerns.  There are no specific complaints   ROS Denies recent fever or chills. Denies sinus pressure, nasal congestion, ear pain or sore throat. Denies chest congestion, productive cough or wheezing. Denies chest pains, palpitations and leg swelling Denies abdominal pain, nausea, vomiting,diarrhea or constipation.   Denies dysuria, frequency, hesitancy or incontinence. Denies joint pain, swelling and limitation in mobility. Denies headaches, seizures, numbness, or tingling. Denies depression, anxiety or insomnia. Denies skin break down or rash.   PE  BP 120/84   Pulse 73   Ht '5\' 5"'$  (1.651 m)   Wt 160 lb 1.9 oz (72.6 kg)   SpO2 97%   BMI 26.65 kg/m   Patient alert and oriented and in no cardiopulmonary distress.  HEENT: No facial asymmetry, EOMI,     Neck supple .  Chest: Clear to auscultation bilaterally.  CVS: S1, S2 no murmurs, no S3.Regular rate.  ABD: Soft non tender.   Ext: No edema  MS: Adequate ROM spine, shoulders, hips and knees.  Skin: Intact, no ulcerations or rash noted.  Psych: Good eye contact, normal affect. Memory intact not anxious or depressed appearing.  CNS: CN 2-12 intact, power,  normal throughout.no focal deficits noted.   Assessment & Plan  Essential hypertension Controlled, no change in medication DASH diet and commitment to daily physical activity for a minimum of 30 minutes discussed and encouraged, as a part of  hypertension management. The importance of attaining a healthy weight is also discussed.     10/29/2022    8:16 AM 10/29/2022    8:10 AM 09/15/2022    8:32 AM 09/15/2022    8:15 AM 09/15/2022    8:12 AM 02/11/2022    8:31 AM 01/14/2022    8:27 AM  BP/Weight  Systolic BP 660 630 160 109 323 557 322  Diastolic BP 84 82 92 80 89 83 85  Wt. (Lbs)  160.12   161.12 162 164.08  BMI  25.84 kg/m2   26.81 kg/m2 26.96 kg/m2 27.3 kg/m2       Dyslipidemia Hyperlipidemia:Low fat diet discussed and encouraged.   Lipid Panel  Lab Results  Component Value Date   CHOL 224 (H) 09/09/2022   HDL 84 09/09/2022   LDLCALC 125 (H) 09/09/2022   TRIG 85 09/09/2022   CHOLHDL 2.7 09/09/2022     Updated lab needed at/ before next visit.   Overweight (BMI 25.0-29.9)  Patient re-educated about  the importance of commitment to a  minimum of 150 minutes of exercise per week as able.  The importance of healthy food choices with portion control discussed, as well as eating regularly and within a 12 hour window most days. The need to choose "clean , green" food 50 to 75% of the time is discussed, as well as to make water the primary drink and set a goal of 64 ounces water daily.       10/29/2022    8:16 AM 10/29/2022  8:10 AM 09/15/2022    8:12 AM  Weight /BMI  Weight  160 lb 1.9 oz 161 lb 1.9 oz  Height '5\' 5"'$  (1.651 m) '5\' 6"'$  (1.676 m) '5\' 5"'$  (1.651 m)  BMI 26.65 kg/m2 25.84 kg/m2 26.81 kg/m2

## 2022-10-29 NOTE — Assessment & Plan Note (Signed)
Controlled, no change in medication DASH diet and commitment to daily physical activity for a minimum of 30 minutes discussed and encouraged, as a part of hypertension management. The importance of attaining a healthy weight is also discussed.     10/29/2022    8:16 AM 10/29/2022    8:10 AM 09/15/2022    8:32 AM 09/15/2022    8:15 AM 09/15/2022    8:12 AM 02/11/2022    8:31 AM 01/14/2022    8:27 AM  BP/Weight  Systolic BP 703 403 524 818 590 931 121  Diastolic BP 84 82 92 80 89 83 85  Wt. (Lbs)  160.12   161.12 162 164.08  BMI  25.84 kg/m2   26.81 kg/m2 26.96 kg/m2 27.3 kg/m2

## 2022-10-29 NOTE — Assessment & Plan Note (Signed)
Hyperlipidemia:Low fat diet discussed and encouraged.   Lipid Panel  Lab Results  Component Value Date   CHOL 224 (H) 09/09/2022   HDL 84 09/09/2022   LDLCALC 125 (H) 09/09/2022   TRIG 85 09/09/2022   CHOLHDL 2.7 09/09/2022     Updated lab needed at/ before next visit.

## 2022-10-29 NOTE — Patient Instructions (Addendum)
F/u in 5 months, call if you need me sooner  Excellent BP, no med change  Commit to daily calcium supplement 1000 mg , drink almond milk  It is important that you exercise regularly at least 30 minutes 5 times a week. If you develop chest pain, have severe difficulty breathing, or feel very tired, stop exercising immediately and seek medical attention    Increase vegetables and fruit in your diet, and reduce fried and fatty foods  Fasting CBc, lipid, cmp and EGFr, CBC, TSH, vit D 1 week before visit  Please schedule March mammogram at checkout  Thanks for choosing [facility], we consider it a privelige to serve you.

## 2023-01-28 DIAGNOSIS — E559 Vitamin D deficiency, unspecified: Secondary | ICD-10-CM | POA: Diagnosis not present

## 2023-01-28 DIAGNOSIS — I10 Essential (primary) hypertension: Secondary | ICD-10-CM | POA: Diagnosis not present

## 2023-01-28 DIAGNOSIS — E785 Hyperlipidemia, unspecified: Secondary | ICD-10-CM | POA: Diagnosis not present

## 2023-01-29 LAB — LIPID PANEL
Chol/HDL Ratio: 2.8 ratio (ref 0.0–4.4)
Cholesterol, Total: 206 mg/dL — ABNORMAL HIGH (ref 100–199)
HDL: 74 mg/dL (ref >39)
LDL Chol Calc (NIH): 120 mg/dL — ABNORMAL HIGH (ref 0–99)
Triglycerides: 66 mg/dL (ref 0–149)
VLDL Cholesterol Cal: 12 mg/dL (ref 5–40)

## 2023-01-29 LAB — CMP14+EGFR
ALT: 15 IU/L (ref 0–32)
AST: 20 IU/L (ref 0–40)
Albumin/Globulin Ratio: 1.6 (ref 1.2–2.2)
Albumin: 4.5 g/dL (ref 3.9–4.9)
Alkaline Phosphatase: 113 IU/L (ref 44–121)
BUN/Creatinine Ratio: 28 (ref 12–28)
BUN: 23 mg/dL (ref 8–27)
Bilirubin Total: 0.4 mg/dL (ref 0.0–1.2)
CO2: 23 mmol/L (ref 20–29)
Calcium: 10.2 mg/dL (ref 8.7–10.3)
Chloride: 97 mmol/L (ref 96–106)
Creatinine, Ser: 0.83 mg/dL (ref 0.57–1.00)
Globulin, Total: 2.8 g/dL (ref 1.5–4.5)
Glucose: 89 mg/dL (ref 70–99)
Potassium: 3.4 mmol/L — ABNORMAL LOW (ref 3.5–5.2)
Sodium: 137 mmol/L (ref 134–144)
Total Protein: 7.3 g/dL (ref 6.0–8.5)
eGFR: 79 mL/min/{1.73_m2} (ref >59)

## 2023-01-29 LAB — CBC
Hematocrit: 36.9 % (ref 34.0–46.6)
Hemoglobin: 11.4 g/dL (ref 11.1–15.9)
MCH: 25.9 pg — ABNORMAL LOW (ref 26.6–33.0)
MCHC: 30.9 g/dL — ABNORMAL LOW (ref 31.5–35.7)
MCV: 84 fL (ref 79–97)
Platelets: 395 10*3/uL (ref 150–450)
RBC: 4.41 x10E6/uL (ref 3.77–5.28)
RDW: 11.6 % — ABNORMAL LOW (ref 11.7–15.4)
WBC: 7 10*3/uL (ref 3.4–10.8)

## 2023-01-29 LAB — TSH: TSH: 0.761 u[IU]/mL (ref 0.450–4.500)

## 2023-01-29 LAB — VITAMIN D 25 HYDROXY (VIT D DEFICIENCY, FRACTURES): Vit D, 25-Hydroxy: 26.9 ng/mL — ABNORMAL LOW (ref 30.0–100.0)

## 2023-02-04 ENCOUNTER — Encounter: Payer: Self-pay | Admitting: Family Medicine

## 2023-02-04 ENCOUNTER — Ambulatory Visit: Payer: BC Managed Care – PPO | Admitting: Family Medicine

## 2023-02-04 VITALS — BP 148/90 | HR 69 | Ht 65.0 in | Wt 161.1 lb

## 2023-02-04 DIAGNOSIS — I10 Essential (primary) hypertension: Secondary | ICD-10-CM

## 2023-02-04 DIAGNOSIS — E663 Overweight: Secondary | ICD-10-CM | POA: Diagnosis not present

## 2023-02-04 DIAGNOSIS — E785 Hyperlipidemia, unspecified: Secondary | ICD-10-CM

## 2023-02-04 MED ORDER — AMLODIPINE BESYLATE 10 MG PO TABS: 10.0000 mg | ORAL_TABLET | Freq: Every day | ORAL | 1 refills | Status: DC

## 2023-02-04 MED ORDER — TRIAMTERENE-HCTZ 75-50 MG PO TABS: 1.0000 | ORAL_TABLET | Freq: Every day | ORAL | 3 refills | Status: DC

## 2023-02-04 NOTE — Patient Instructions (Signed)
F/U blood pressure in 4 to 6 weeks, call if you need me sooner  Higher dose amlodipine 10 mg starting today, continue triamterene as before  It is important that you exercise regularly at least 30 minutes 5 times a week. If you develop chest pain, have severe difficulty breathing, or feel very tired, stop exercising immediately and seek medical attention   Labs are improved, but need to continue to reduce fried and fatty foods

## 2023-02-06 ENCOUNTER — Encounter: Payer: Self-pay | Admitting: Family Medicine

## 2023-02-06 NOTE — Assessment & Plan Note (Signed)
  Patient re-educated about  the importance of commitment to a  minimum of 150 minutes of exercise per week as able.  The importance of healthy food choices with portion control discussed, as well as eating regularly and within a 12 hour window most days. The need to choose "clean , green" food 50 to 75% of the time is discussed, as well as to make water the primary drink and set a goal of 64 ounces water daily.       02/04/2023    8:24 AM 10/29/2022    8:16 AM 10/29/2022    8:10 AM  Weight /BMI  Weight 161 lb 1.9 oz  160 lb 1.9 oz  Height '5\' 5"'$  (1.651 m) '5\' 5"'$  (1.651 m) '5\' 6"'$  (1.676 m)  BMI 26.81 kg/m2 26.65 kg/m2 25.84 kg/m2

## 2023-02-06 NOTE — Assessment & Plan Note (Signed)
Uncontrolled , increase amlodipine dose DASH diet and commitment to daily physical activity for a minimum of 30 minutes discussed and encouraged, as a part of hypertension management. The importance of attaining a healthy weight is also discussed.     02/04/2023    9:01 AM 02/04/2023    8:26 AM 02/04/2023    8:24 AM 10/29/2022    8:16 AM 10/29/2022    8:10 AM 09/15/2022    8:32 AM 09/15/2022    8:15 AM  BP/Weight  Systolic BP 123456 Q000111Q Q000111Q 123456 [id number] 123456  Diastolic BP 90 82 82 84 82 92 80  Wt. (Lbs)   161.12  160.12    BMI   26.81 kg/m2  25.84 kg/m2      '

## 2023-02-06 NOTE — Progress Notes (Signed)
Paula Randolph     MRN: [id number]      DOB: September 11, 1961   HPI Paula Randolph is here for follow up and re-evaluation of chronic medical conditions, medication management and review of any available recent lab and radiology data.  Preventive health is updated, specifically  Cancer screening and Immunization.   Questions or concerns regarding consultations or procedures which the PT has had in the interim are  addressed. The PT denies any adverse reactions to current medications since the last visit.  There are no new concerns.  There are no specific complaints   ROS Denies recent fever or chills. Denies sinus pressure, nasal congestion, ear pain or sore throat. Denies chest congestion, productive cough or wheezing. Denies chest pains, palpitations and leg swelling Denies abdominal pain, nausea, vomiting,diarrhea or constipation.   Denies dysuria, frequency, hesitancy or incontinence. Denies joint pain, swelling and limitation in mobility. Denies headaches, seizures, numbness, or tingling. Denies depression, anxiety or insomnia. Denies skin break down or rash.   PE  BP (!) 148/90   Pulse 69   Ht 5' 5"$  (1.651 m)   Wt 161 lb 1.9 oz (73.1 kg)   SpO2 96%   BMI 26.81 kg/m   Patient alert and oriented and in no cardiopulmonary distress.  HEENT: No facial asymmetry, EOMI,     Neck supple .  Chest: Clear to auscultation bilaterally.  CVS: S1, S2 no murmurs, no S3.Regular rate.  ABD: Soft non tender.   Ext: No edema  MS: Adequate ROM spine, shoulders, hips and knees.  Skin: Intact, no ulcerations or rash noted.  Psych: Good eye contact, normal affect. Memory intact not anxious or depressed appearing.  CNS: CN 2-12 intact, power,  normal throughout.no focal deficits noted.   Assessment & Plan  Essential hypertension Uncontrolled , increase amlodipine dose DASH diet and commitment to daily physical activity for a minimum of 30 minutes discussed and encouraged, as a part  of hypertension management. The importance of attaining a healthy weight is also discussed.     02/04/2023    9:01 AM 02/04/2023    8:26 AM 02/04/2023    8:24 AM 10/29/2022    8:16 AM 10/29/2022    8:10 AM 09/15/2022    8:32 AM 09/15/2022    8:15 AM  BP/Weight  Systolic BP 123456 Q000111Q Q000111Q 123456 [id number] 123456  Diastolic BP 90 82 82 84 82 92 80  Wt. (Lbs)   161.12  160.12    BMI   26.81 kg/m2  25.84 kg/m2      '  Overweight (BMI 25.0-29.9)  Patient re-educated about  the importance of commitment to a  minimum of 150 minutes of exercise per week as able.  The importance of healthy food choices with portion control discussed, as well as eating regularly and within a 12 hour window most days. The need to choose "clean , green" food 50 to 75% of the time is discussed, as well as to make water the primary drink and set a goal of 64 ounces water daily.       02/04/2023    8:24 AM 10/29/2022    8:16 AM 10/29/2022    8:10 AM  Weight /BMI  Weight 161 lb 1.9 oz  160 lb 1.9 oz  Height 5' 5"$  (1.651 m) 5' 5"$  (1.651 m) 5' 6"$  (1.676 m)  BMI 26.81 kg/m2 26.65 kg/m2 25.84 kg/m2      Dyslipidemia Hyperlipidemia:Low fat diet discussed and encouraged.   Lipid Panel  Lab Results  Component Value Date   CHOL 206 (H) 01/28/2023   HDL 74 01/28/2023   LDLCALC 120 (H) 01/28/2023   TRIG 66 01/28/2023   CHOLHDL 2.8 01/28/2023     Need to lower fat intake, improved , but not yet at goal

## 2023-02-06 NOTE — Assessment & Plan Note (Signed)
Hyperlipidemia:Low fat diet discussed and encouraged.   Lipid Panel  Lab Results  Component Value Date   CHOL 206 (H) 01/28/2023   HDL 74 01/28/2023   LDLCALC 120 (H) 01/28/2023   TRIG 66 01/28/2023   CHOLHDL 2.8 01/28/2023     Need to lower fat intake, improved , but not yet at goal

## 2023-03-16 ENCOUNTER — Ambulatory Visit: Payer: BC Managed Care – PPO | Admitting: Family Medicine

## 2023-03-16 ENCOUNTER — Encounter: Payer: Self-pay | Admitting: Family Medicine

## 2023-03-16 VITALS — BP 128/82 | HR 82 | Ht 65.0 in | Wt 161.0 lb

## 2023-03-16 DIAGNOSIS — E785 Hyperlipidemia, unspecified: Secondary | ICD-10-CM | POA: Diagnosis not present

## 2023-03-16 DIAGNOSIS — E559 Vitamin D deficiency, unspecified: Secondary | ICD-10-CM

## 2023-03-16 DIAGNOSIS — I10 Essential (primary) hypertension: Secondary | ICD-10-CM | POA: Diagnosis not present

## 2023-03-16 DIAGNOSIS — E663 Overweight: Secondary | ICD-10-CM | POA: Diagnosis not present

## 2023-03-16 NOTE — Assessment & Plan Note (Signed)
Controlled, no change in medication DASH diet and commitment to daily physical activity for a minimum of 30 minutes discussed and encouraged, as a part of hypertension management. The importance of attaining a healthy weight is also discussed.     03/16/2023    8:20 AM 03/16/2023    8:11 AM 02/04/2023    9:01 AM 02/04/2023    8:26 AM 02/04/2023    8:24 AM 10/29/2022    8:16 AM 10/29/2022    8:10 AM  BP/Weight  Systolic BP 0000000 [id number] 123456 Q000111Q Q000111Q 123456 [id number]  Diastolic BP 82 69 90 82 82 84 82  Wt. (Lbs)  161.04   161.12  160.12  BMI  26.8 kg/m2   26.81 kg/m2  25.84 kg/m2

## 2023-03-16 NOTE — Assessment & Plan Note (Signed)
Hyperlipidemia:Low fat diet discussed and encouraged.   Lipid Panel  Lab Results  Component Value Date   CHOL 206 (H) 01/28/2023   HDL 74 01/28/2023   LDLCALC 120 (H) 01/28/2023   TRIG 66 01/28/2023   CHOLHDL 2.8 01/28/2023     Updated lab needed at/ before next visit.

## 2023-03-16 NOTE — Assessment & Plan Note (Signed)
Corrected when last checked

## 2023-03-16 NOTE — Progress Notes (Signed)
Paula Randolph     MRN: [id number]      DOB: 04-20-1961   HPI [name] is here for follow up and re-evaluation of chronic medical conditions, medication management and review of any available recent lab and radiology data.  Preventive health is updated, specifically  Cancer screening and Immunization.   Questions or concerns regarding consultations or procedures which the PT has had in the interim are  addressed. The PT denies any adverse reactions to current medications since the last visit.  There are no new concerns.  There are no specific complaints   ROS Denies recent fever or chills. Denies sinus pressure, nasal congestion, ear pain or sore throat. Denies chest congestion, productive cough or wheezing. Denies chest pains, palpitations and leg swelling Denies abdominal pain, nausea, vomiting,diarrhea or constipation.   Denies dysuria, frequency, hesitancy or incontinence. Denies joint pain, swelling and limitation in mobility. Denies headaches, seizures, numbness, or tingling. Denies depression, anxiety or insomnia. Denies skin break down or rash.   PE  BP 128/82   Pulse 82   Ht 5\' 5"  (1.651 m)   Wt 161 lb 0.6 oz (73 kg)   SpO2 100%   BMI 26.80 kg/m   Patient alert and oriented and in no cardiopulmonary distress.  HEENT: No facial asymmetry, EOMI,     Neck supple .  Chest: Clear to auscultation bilaterally.  CVS: S1, S2 no murmurs, no S3.Regular rate.  ABD: Soft non tender.   Ext: No edema  MS: Adequate ROM spine, shoulders, hips and knees.  Skin: Intact, no ulcerations or rash noted.  Psych: Good eye contact, normal affect. Memory intact not anxious or depressed appearing.  CNS: CN 2-12 intact, power,  normal throughout.no focal deficits noted.   Assessment & Plan  Essential hypertension Controlled, no change in medication DASH diet and commitment to daily physical activity for a minimum of 30 minutes discussed and encouraged, as a part of  hypertension management. The importance of attaining a healthy weight is also discussed.     03/16/2023    8:20 AM 03/16/2023    8:11 AM 02/04/2023    9:01 AM 02/04/2023    8:26 AM 02/04/2023    8:24 AM 10/29/2022    8:16 AM 10/29/2022    8:10 AM  BP/Weight  Systolic BP 0000000 [id number] [id number] [id number]  Diastolic BP 82 69 90 82 82 84 82  Wt. (Lbs)  161.04   161.12  160.12  BMI  26.8 kg/m2   26.81 kg/m2  25.84 kg/m2       Dyslipidemia Hyperlipidemia:Low fat diet discussed and encouraged.   Lipid Panel  Lab Results  Component Value Date   CHOL 206 (H) 01/28/2023   HDL 74 01/28/2023   LDLCALC 120 (H) 01/28/2023   TRIG 66 01/28/2023   CHOLHDL 2.8 01/28/2023     Updated lab needed at/ before next visit.   Overweight (BMI 25.0-29.9)  Patient re-educated about  the importance of commitment to a  minimum of 150 minutes of exercise per week as able.  The importance of healthy food choices with portion control discussed, as well as eating regularly and within a 12 hour window most days. The need to choose "clean , green" food 50 to 75% of the time is discussed, as well as to make water the primary drink and set a goal of 64 ounces water daily.       03/16/2023    8:11 AM 02/04/2023    8:24  AM 10/29/2022    8:16 AM  Weight /BMI  Weight 161 lb 0.6 oz 161 lb 1.9 oz   Height 5\' 5"  (1.651 m) 5\' 5"  (1.651 m) 5\' 5"  (1.651 m)  BMI 26.8 kg/m2 26.81 kg/m2 26.65 kg/m2      Vitamin D deficiency Corrected when last checked

## 2023-03-16 NOTE — Patient Instructions (Addendum)
Annual exam in September, call if you need me sooner  Excellent BP  GREAT you have all vaccines taken care of!  It is important that you exercise regularly at least 30 minutes 5 times a week. If you develop chest pain, have severe difficulty breathing, or feel very tired, stop exercising immediately and seek medical attention   Fasting lipid, chem 7 7 and EGFR 3 to 5 days  before Sept appt  Thanks for choosing [facility], we consider it a privelige to serve you.

## 2023-03-16 NOTE — Assessment & Plan Note (Signed)
  Patient re-educated about  the importance of commitment to a  minimum of 150 minutes of exercise per week as able.  The importance of healthy food choices with portion control discussed, as well as eating regularly and within a 12 hour window most days. The need to choose "clean , green" food 50 to 75% of the time is discussed, as well as to make water the primary drink and set a goal of 64 ounces water daily.       03/16/2023    8:11 AM 02/04/2023    8:24 AM 10/29/2022    8:16 AM  Weight /BMI  Weight 161 lb 0.6 oz 161 lb 1.9 oz   Height 5\' 5"  (1.651 m) 5\' 5"  (1.651 m) 5\' 5"  (1.651 m)  BMI 26.8 kg/m2 26.81 kg/m2 26.65 kg/m2

## 2023-03-17 ENCOUNTER — Ambulatory Visit: Payer: BC Managed Care – PPO | Admitting: Family Medicine

## 2023-03-23 ENCOUNTER — Ambulatory Visit (HOSPITAL_COMMUNITY)
Admission: RE | Admit: 2023-03-23 | Discharge: 2023-03-23 | Disposition: A | Discharge status: Home / Self Care | Payer: BC Managed Care – PPO | Source: Ambulatory Visit | Attending: Family Medicine | Admitting: Family Medicine

## 2023-03-23 DIAGNOSIS — Z1231 Encounter for screening mammogram for malignant neoplasm of breast: Secondary | ICD-10-CM | POA: Insufficient documentation

## 2023-04-20 ENCOUNTER — Other Ambulatory Visit: Payer: Self-pay | Admitting: Family Medicine

## 2023-04-22 ENCOUNTER — Telehealth: Payer: Self-pay | Admitting: Family Medicine

## 2023-04-22 ENCOUNTER — Other Ambulatory Visit: Payer: Self-pay

## 2023-04-22 MED ORDER — TRIAMTERENE-HCTZ 75-50 MG PO TABS: 1.0000 | ORAL_TABLET | Freq: Every day | ORAL | 3 refills | Status: DC

## 2023-04-22 MED ORDER — AMLODIPINE BESYLATE 10 MG PO TABS: 10.0000 mg | ORAL_TABLET | Freq: Every day | ORAL | 1 refills | Status: DC

## 2023-04-22 NOTE — Telephone Encounter (Signed)
Patient called has wrong dosage on amLODipine (NORVASC) 5 MG tablet [[id number]]   And needs this medicine called into her pharmacy , pharmacy does not have it yet triamterene-hydrochlorothiazide (MAXZIDE) 75-50 MG tablet [[id number]]   Pharmacy  [facility] [id number] - [place], [place] - [address] AT [facility] [address], [place] [address] Phone: [phone]  Fax: [phone] DEA #: [id number]

## 2023-04-22 NOTE — Telephone Encounter (Signed)
Refills sent

## 2023-05-17 ENCOUNTER — Ambulatory Visit: Payer: BC Managed Care – PPO | Admitting: Family Medicine

## 2023-05-17 ENCOUNTER — Encounter: Payer: Self-pay | Admitting: Family Medicine

## 2023-05-17 VITALS — BP 136/82 | HR 72 | Ht 65.0 in | Wt 163.1 lb

## 2023-05-17 DIAGNOSIS — E663 Overweight: Secondary | ICD-10-CM | POA: Diagnosis not present

## 2023-05-17 DIAGNOSIS — R238 Other skin changes: Secondary | ICD-10-CM

## 2023-05-17 DIAGNOSIS — L819 Disorder of pigmentation, unspecified: Secondary | ICD-10-CM | POA: Insufficient documentation

## 2023-05-17 DIAGNOSIS — I10 Essential (primary) hypertension: Secondary | ICD-10-CM | POA: Diagnosis not present

## 2023-05-17 MED ORDER — CEPHALEXIN 500 MG PO CAPS: 500.0000 mg | ORAL_CAPSULE | Freq: Two times a day (BID) | ORAL | 0 refills | Status: DC

## 2023-05-17 MED ORDER — TRIAMTERENE-HCTZ 75-50 MG PO TABS: 1.0000 | ORAL_TABLET | Freq: Every day | ORAL | 3 refills | Status: DC

## 2023-05-17 NOTE — Patient Instructions (Signed)
F/U as before, call if you need me sooner  You are treated for infected skin lesions, call for Dermatology referral if persists or worsen   Please do not scratch  Thanks for choosing [facility], we consider it a privelige to serve you.

## 2023-05-17 NOTE — Assessment & Plan Note (Signed)
  Patient re-educated about  the importance of commitment to a  minimum of 150 minutes of exercise per week as able.  The importance of healthy food choices with portion control discussed, as well as eating regularly and within a 12 hour window most days. The need to choose "clean , green" food 50 to 75% of the time is discussed, as well as to make water the primary drink and set a goal of 64 ounces water daily.       05/17/2023    8:02 AM 03/16/2023    8:11 AM 02/04/2023    8:24 AM  Weight /BMI  Weight 163 lb 1.9 oz 161 lb 0.6 oz 161 lb 1.9 oz  Height 5\' 5"  (1.651 m) 5\' 5"  (1.651 m) 5\' 5"  (1.651 m)  BMI 27.14 kg/m2 26.8 kg/m2 26.81 kg/m2

## 2023-05-17 NOTE — Progress Notes (Signed)
Paula Randolph     MRN: [id number]      DOB: 03-07-1961  CC: see note by Nurse   HPI Paula Randolph is here for follow up and re-evaluation of chronic medical conditions, medication management and review of any available recent lab and radiology data.  Preventive health is updated, specifically  Cancer screening and Immunization.   1 week h/o itchy skin lesions appearing first  on right hand , then left , then right anklr, total of 7 in all, drain, purulnt looking n material and itch Npo one else has rash, no new foods or products    ROS Denies recent fever or chills. Denies sinus pressure, nasal congestion, ear pain or sore throat. Denies chest congestion, productive cough or wheezing. Denies chest pains, palpitations and leg swelling Denies abdominal pain, nausea, vomiting,diarrhea or constipation.   Denies dysuria, frequency, hesitancy or incontinence. Denies joint pain, swelling and limitation in mobility. Denies headaches, seizures, numbness, or tingling. Denies depression, anxiety or insomnia.    PE  BP 136/82 (BP Location: Right Arm, Patient Position: Sitting, Cuff Size: Large)   Pulse 72   Ht 5\' 5"  (1.651 m)   Wt 163 lb 1.9 oz (74 kg)   SpO2 98%   BMI 27.14 kg/m   Patient alert and oriented and in no cardiopulmonary distress.  HEENT: No facial asymmetry, EOMI,     Neck supple .  Chest: Clear to auscultation bilaterally.  CVS: S1, S2 no murmurs, no S3.Regular rate.  ABD: Soft non tender.   Ext: No edema  MS: Adequate ROM spine, shoulders, hips and knees.  Skin: hyperpigmented macules on dorsum of han and right anke, crustin on dome of lesion. Psych: Good eye contact, normal affect. Memory intact not anxious or depressed appearing.  CNS: CN 2-12 intact, power,  normal throughout.no focal deficits noted.   Assessment & Plan  Vesicular skin lesions Keflex x 10 days, Dermatology if persists or worsens  Essential hypertension Controlled, no change in  medication DASH diet and commitment to daily physical activity for a minimum of 30 minutes discussed and encouraged, as a part of hypertension management. The importance of attaining a healthy weight is also discussed.     05/17/2023    8:03 AM 05/17/2023    8:02 AM 03/16/2023    8:20 AM 03/16/2023    8:11 AM 02/04/2023    9:01 AM 02/04/2023    8:26 AM 02/04/2023    8:24 AM  BP/Weight  Systolic BP 136 148 128 110 148 150 150  Diastolic BP 82 82 82 69 90 82 82  Wt. (Lbs)  163.12  161.04   161.12  BMI  27.14 kg/m2  26.8 kg/m2   26.81 kg/m2       Overweight (BMI 25.0-29.9)  Patient re-educated about  the importance of commitment to a  minimum of 150 minutes of exercise per week as able.  The importance of healthy food choices with portion control discussed, as well as eating regularly and within a 12 hour window most days. The need to choose "clean , green" food 50 to 75% of the time is discussed, as well as to make water the primary drink and set a goal of 64 ounces water daily.       05/17/2023    8:02 AM 03/16/2023    8:11 AM 02/04/2023    8:24 AM  Weight /BMI  Weight 163 lb 1.9 oz 161 lb 0.6 oz 161 lb 1.9 oz  Height 5\' 5"  (  1.651 m) 5\' 5"  (1.651 m) 5\' 5"  (1.651 m)  BMI 27.14 kg/m2 26.8 kg/m2 26.81 kg/m2

## 2023-05-17 NOTE — Assessment & Plan Note (Signed)
Controlled, no change in medication DASH diet and commitment to daily physical activity for a minimum of 30 minutes discussed and encouraged, as a part of hypertension management. The importance of attaining a healthy weight is also discussed.     05/17/2023    8:03 AM 05/17/2023    8:02 AM 03/16/2023    8:20 AM 03/16/2023    8:11 AM 02/04/2023    9:01 AM 02/04/2023    8:26 AM 02/04/2023    8:24 AM  BP/Weight  Systolic BP 136 148 128 110 148 150 150  Diastolic BP 82 82 82 69 90 82 82  Wt. (Lbs)  163.12  161.04   161.12  BMI  27.14 kg/m2  26.8 kg/m2   26.81 kg/m2

## 2023-05-17 NOTE — Assessment & Plan Note (Signed)
Keflex x 10 days, Dermatology if persists or worsens

## 2023-06-27 ENCOUNTER — Telehealth: Payer: Self-pay | Admitting: Family Medicine

## 2023-06-27 NOTE — Telephone Encounter (Signed)
Patient called in requesting Dermatology referral be placed to   [facility] in [place], [facility] Address: [address], [place], [place] [address]  Phone: [phone]    Wants a call back when placed.

## 2023-07-04 NOTE — Addendum Note (Signed)
Addended by: Kerri Perches on: 07/04/2023 08:16 AM   Modules accepted: Orders

## 2023-07-15 DIAGNOSIS — L439 Lichen planus, unspecified: Secondary | ICD-10-CM | POA: Diagnosis not present

## 2023-07-15 DIAGNOSIS — R21 Rash and other nonspecific skin eruption: Secondary | ICD-10-CM | POA: Diagnosis not present

## 2023-07-29 DIAGNOSIS — L439 Lichen planus, unspecified: Secondary | ICD-10-CM | POA: Diagnosis not present

## 2023-09-21 ENCOUNTER — Encounter: Payer: BC Managed Care – PPO | Admitting: Family Medicine

## 2023-10-19 DIAGNOSIS — E785 Hyperlipidemia, unspecified: Secondary | ICD-10-CM | POA: Diagnosis not present

## 2023-10-19 DIAGNOSIS — I10 Essential (primary) hypertension: Secondary | ICD-10-CM | POA: Diagnosis not present

## 2023-10-20 LAB — BMP8+EGFR
BUN/Creatinine Ratio: 24 (ref 12–28)
BUN: 16 mg/dL (ref 8–27)
CO2: 26 mmol/L (ref 20–29)
Calcium: 9.9 mg/dL (ref 8.7–10.3)
Chloride: 100 mmol/L (ref 96–106)
Creatinine, Ser: 0.67 mg/dL (ref 0.57–1.00)
Glucose: 102 mg/dL — ABNORMAL HIGH (ref 70–99)
Potassium: 3.6 mmol/L (ref 3.5–5.2)
Sodium: 140 mmol/L (ref 134–144)
eGFR: 98 mL/min/{1.73_m2} (ref >59)

## 2023-10-20 LAB — LIPID PANEL
Chol/HDL Ratio: 3 {ratio} (ref 0.0–4.4)
Cholesterol, Total: 206 mg/dL — ABNORMAL HIGH (ref 100–199)
HDL: 69 mg/dL (ref >39)
LDL Chol Calc (NIH): 122 mg/dL — ABNORMAL HIGH (ref 0–99)
Triglycerides: 84 mg/dL (ref 0–149)
VLDL Cholesterol Cal: 15 mg/dL (ref 5–40)

## 2023-10-25 ENCOUNTER — Encounter: Payer: Self-pay | Admitting: Family Medicine

## 2023-10-25 ENCOUNTER — Ambulatory Visit (INDEPENDENT_AMBULATORY_CARE_PROVIDER_SITE_OTHER): Payer: Medicaid Other | Admitting: Family Medicine

## 2023-10-25 ENCOUNTER — Encounter: Payer: Self-pay | Admitting: *Deleted

## 2023-10-25 VITALS — BP 120/73 | HR 95 | Ht 65.0 in | Wt 164.1 lb

## 2023-10-25 DIAGNOSIS — Z23 Encounter for immunization: Secondary | ICD-10-CM | POA: Diagnosis not present

## 2023-10-25 DIAGNOSIS — Z780 Asymptomatic menopausal state: Secondary | ICD-10-CM | POA: Diagnosis not present

## 2023-10-25 DIAGNOSIS — Z0000 Encounter for general adult medical examination without abnormal findings: Secondary | ICD-10-CM

## 2023-10-25 DIAGNOSIS — Z1231 Encounter for screening mammogram for malignant neoplasm of breast: Secondary | ICD-10-CM

## 2023-10-25 DIAGNOSIS — R300 Dysuria: Secondary | ICD-10-CM | POA: Diagnosis not present

## 2023-10-25 DIAGNOSIS — Z1211 Encounter for screening for malignant neoplasm of colon: Secondary | ICD-10-CM

## 2023-10-25 MED ORDER — AMLODIPINE BESYLATE 10 MG PO TABS: 10.0000 mg | ORAL_TABLET | Freq: Every day | ORAL | 3 refills | Status: DC

## 2023-10-25 NOTE — Assessment & Plan Note (Signed)
Mildly symptomatic, CCUA is NORMAL. No additional testing indicated , encouraged to push water and void often

## 2023-10-25 NOTE — Progress Notes (Signed)
Paula Randolph     MRN: [id number]      DOB: 02/15/1961  Chief Complaint  Patient presents with   Annual Exam    CPE possible uti    HPI: Patient is in for annual physical exam. C/o malodorous urine and frequency x 3 days, bno fever, chills or flank  pain Recent labs,  are reviewed. Immunization is reviewed , and  updated    PE: BP 120/73 (BP Location: Right Arm, Patient Position: Sitting, Cuff Size: Normal)   Pulse 95   Ht 5\' 5"  (1.651 m)   Wt 164 lb 1.9 oz (74.4 kg)   SpO2 94%   BMI 27.31 kg/m   Pleasant  female, alert and oriented x 3, in no cardio-pulmonary distress. Afebrile. HEENT No facial trauma or asymetry. Sinuses non tender.  Extra occullar muscles intact.. External ears normal, . Neck: supple, no adenopathy,JVD or thyromegaly.No bruits.  Chest: Clear to ascultation bilaterally.No crackles or wheezes. Non tender to palpation   Cardiovascular system; Heart sounds normal,  S1 and  S2 ,no S3.  No murmur, or thrill. Apical beat not displaced Peripheral pulses normal.  Abdomen: Soft, non tender, no organomegaly or masses. No bruits. Bowel sounds normal. No guarding, tenderness or rebound.    Musculoskeletal exam: Full ROM of spine, hips , shoulders and knees. No deformity ,swelling or crepitus noted. No muscle wasting or atrophy.   Neurologic: Cranial nerves 2 to 12 intact. Power, tone ,sensation and reflexes normal throughout. No disturbance in gait. No tremor.  Skin: Intact, no ulceration, erythema , scaling or rash noted. Pigmentation normal throughout  Psych; Normal mood and affect. Judgement and concentration normal   Assessment & Plan:  Annual physical exam Annual exam as documented. Counseling done  re healthy lifestyle involving commitment to 150 minutes exercise per week, heart healthy diet, and attaining healthy weight.The importance of adequate sleep also discussed. Regular seat belt use and home safety, is also  discussed. Changes in health habits are decided on by the patient with goals and time frames  set for achieving them. Immunization and cancer screening needs are specifically addressed at this visit.   Encounter for immunization After obtaining informed consent, the vaccine is  administered , with no adverse effect noted at the time of administration.   Dysuria Mildly symptomatic, CCUA is NORMAL. No additional testing indicated , encouraged to push water and void often

## 2023-10-25 NOTE — Assessment & Plan Note (Signed)

## 2023-10-25 NOTE — Assessment & Plan Note (Signed)
After obtaining informed consent, the vaccine is  administered , with no adverse effect noted at the time of administration.  

## 2023-10-25 NOTE — Patient Instructions (Signed)
F/U in early April, call if you need me   sooner  PLEASE SCHEDULE MAMMOGRAM AT CHECKOUT  Flu vaccine today  URINE IS NORMAL, NO uti  Please get covid vaccine at your pharmacy  You are referred for colonoscopy due 04/2024  Please schedule bone density test at checkout   Cut back on sweets and fried and fatty foods please, eat MORE natural foods from the  ground, BOTH YOUR CHOLESTEROL AND BLOOD SUGAR ARE A BIT TOO HIGH  Fasting CBC, lipid, cmp and Egfr, tsh AND VIT D 3 TO 5 DAYS BEFORE APRIL  APPOINTMENT  It is important that you exercise regularly at least 30 minutes 5 times a week. If you develop chest pain, have severe difficulty breathing, or feel very tired, stop exercising immediately and seek medical attention    Best FOR THE sEASON AND 2025!

## 2023-10-26 LAB — URINALYSIS
Bilirubin, UA: NEGATIVE
Glucose, UA: NEGATIVE
Ketones, UA: NEGATIVE
Leukocytes,UA: NEGATIVE
Nitrite, UA: NEGATIVE
Protein,UA: NEGATIVE
RBC, UA: NEGATIVE
Specific Gravity, UA: 1.02 (ref 1.005–1.030)
Urobilinogen, Ur: 1 mg/dL (ref 0.2–1.0)
pH, UA: 7.5 (ref 5.0–7.5)

## 2023-12-06 ENCOUNTER — Telehealth: Payer: Self-pay | Admitting: Family Medicine

## 2023-12-06 NOTE — Telephone Encounter (Signed)
Pt called in requesting new dermatology referral   Arin L. Roseanne Kaufman, MD Dermatologist in [place], [place] Address: [address] [place], [place], [place] [address] Phone: [phone]  Current office will be closing / provider leaving

## 2023-12-07 ENCOUNTER — Other Ambulatory Visit: Payer: Self-pay

## 2023-12-07 DIAGNOSIS — R238 Other skin changes: Secondary | ICD-10-CM

## 2023-12-07 NOTE — Telephone Encounter (Signed)
Referral sent 

## 2024-02-27 DIAGNOSIS — L309 Dermatitis, unspecified: Secondary | ICD-10-CM | POA: Diagnosis not present

## 2024-02-27 DIAGNOSIS — L810 Postinflammatory hyperpigmentation: Secondary | ICD-10-CM | POA: Diagnosis not present

## 2024-03-12 ENCOUNTER — Telehealth: Payer: Self-pay | Admitting: Family Medicine

## 2024-03-12 DIAGNOSIS — R21 Rash and other nonspecific skin eruption: Secondary | ICD-10-CM

## 2024-03-12 DIAGNOSIS — D649 Anemia, unspecified: Secondary | ICD-10-CM

## 2024-03-12 DIAGNOSIS — E785 Hyperlipidemia, unspecified: Secondary | ICD-10-CM

## 2024-03-12 DIAGNOSIS — I1A0 Resistant hypertension: Secondary | ICD-10-CM

## 2024-03-12 DIAGNOSIS — R7989 Other specified abnormal findings of blood chemistry: Secondary | ICD-10-CM

## 2024-03-12 DIAGNOSIS — Z1211 Encounter for screening for malignant neoplasm of colon: Secondary | ICD-10-CM

## 2024-03-12 DIAGNOSIS — R7301 Impaired fasting glucose: Secondary | ICD-10-CM

## 2024-03-12 NOTE — Telephone Encounter (Signed)
 Pls call and review med change with pt , instead of triamterene ( maxzide) she is to start olmesartan 20 mg one daily effective 319/2025, I have prescribed to day so will be available for her to collect tomorrow. As she is talking current meds tomorrow, will start the olmesartan on 3/19. Her Dermatologist spoke with me and inferred that her skin condition may be a drug reaction to the triamterene  I have also spoken with the patient but am also asking that you call her so that she is totally clear on the plan Also she needs to get fasting labs before her in office appt I have entered them all

## 2024-03-13 ENCOUNTER — Other Ambulatory Visit: Payer: Self-pay | Admitting: Family Medicine

## 2024-03-13 DIAGNOSIS — D649 Anemia, unspecified: Secondary | ICD-10-CM

## 2024-03-13 DIAGNOSIS — R21 Rash and other nonspecific skin eruption: Secondary | ICD-10-CM

## 2024-03-13 MED ORDER — OLMESARTAN MEDOXOMIL 20 MG PO TABS: 20.0000 mg | ORAL_TABLET | Freq: Every day | ORAL | 2 refills | Status: DC

## 2024-03-13 NOTE — Telephone Encounter (Signed)
 Spoke to pt. , informed her of plan. She stated she understood and that she would start medication tomorrow as requested as well as getting the fasting labs completed before next appt.

## 2024-03-13 NOTE — Telephone Encounter (Signed)
 thanks

## 2024-03-19 DIAGNOSIS — D649 Anemia, unspecified: Secondary | ICD-10-CM | POA: Diagnosis not present

## 2024-03-19 DIAGNOSIS — R7989 Other specified abnormal findings of blood chemistry: Secondary | ICD-10-CM | POA: Diagnosis not present

## 2024-03-19 DIAGNOSIS — R21 Rash and other nonspecific skin eruption: Secondary | ICD-10-CM | POA: Diagnosis not present

## 2024-03-19 DIAGNOSIS — E785 Hyperlipidemia, unspecified: Secondary | ICD-10-CM | POA: Diagnosis not present

## 2024-03-19 DIAGNOSIS — I1A0 Resistant hypertension: Secondary | ICD-10-CM | POA: Diagnosis not present

## 2024-03-19 DIAGNOSIS — E559 Vitamin D deficiency, unspecified: Secondary | ICD-10-CM | POA: Diagnosis not present

## 2024-03-19 DIAGNOSIS — I10 Essential (primary) hypertension: Secondary | ICD-10-CM | POA: Diagnosis not present

## 2024-03-19 DIAGNOSIS — R7301 Impaired fasting glucose: Secondary | ICD-10-CM | POA: Diagnosis not present

## 2024-03-20 ENCOUNTER — Encounter: Payer: Self-pay | Admitting: *Deleted

## 2024-03-20 ENCOUNTER — Other Ambulatory Visit: Payer: Self-pay | Admitting: Family Medicine

## 2024-03-20 ENCOUNTER — Encounter: Payer: Self-pay | Admitting: Family Medicine

## 2024-03-20 DIAGNOSIS — I10 Essential (primary) hypertension: Secondary | ICD-10-CM

## 2024-03-20 DIAGNOSIS — R21 Rash and other nonspecific skin eruption: Secondary | ICD-10-CM

## 2024-03-20 MED ORDER — VITAMIN D (ERGOCALCIFEROL) 1.25 MG (50000 UNIT) PO CAPS: 50000.0000 [IU] | ORAL_CAPSULE | ORAL | 8 refills | Status: DC

## 2024-03-20 NOTE — Addendum Note (Signed)
 Addended by: Kerri Perches on: 03/20/2024 08:15 AM   Modules accepted: Orders

## 2024-03-20 NOTE — Addendum Note (Signed)
 Addended by: Kerri Perches on: 03/20/2024 06:57 AM   Modules accepted: Orders

## 2024-03-21 LAB — CBC WITH DIFFERENTIAL/PLATELET
Basophils Absolute: 0.1 10*3/uL (ref 0.0–0.2)
Basos: 1 %
EOS (ABSOLUTE): 0.3 10*3/uL (ref 0.0–0.4)
Eos: 5 %
Hematocrit: 31.8 % — ABNORMAL LOW (ref 34.0–46.6)
Hemoglobin: 9.7 g/dL — ABNORMAL LOW (ref 11.1–15.9)
Immature Grans (Abs): 0 10*3/uL (ref 0.0–0.1)
Immature Granulocytes: 0 %
Lymphocytes Absolute: 1.7 10*3/uL (ref 0.7–3.1)
Lymphs: 25 %
MCH: 27.1 pg (ref 26.6–33.0)
MCHC: 30.5 g/dL — ABNORMAL LOW (ref 31.5–35.7)
MCV: 89 fL (ref 79–97)
Monocytes Absolute: 0.5 10*3/uL (ref 0.1–0.9)
Monocytes: 8 %
Neutrophils Absolute: 4.1 10*3/uL (ref 1.4–7.0)
Neutrophils: 61 %
Platelets: 498 10*3/uL — ABNORMAL HIGH (ref 150–450)
RBC: 3.58 x10E6/uL — ABNORMAL LOW (ref 3.77–5.28)
RDW: 12.6 % (ref 11.7–15.4)
WBC: 6.7 10*3/uL (ref 3.4–10.8)

## 2024-03-21 LAB — TSH+FREE T4
Free T4: 1.16 ng/dL (ref 0.82–1.77)
TSH: 2.03 u[IU]/mL (ref 0.450–4.500)

## 2024-03-21 LAB — HEMOGLOBIN A1C
Est. average glucose Bld gHb Est-mCnc: 82 mg/dL
Hgb A1c MFr Bld: 4.5 % — ABNORMAL LOW (ref 4.8–5.6)

## 2024-03-21 LAB — LIPID PANEL W/O CHOL/HDL RATIO
Cholesterol, Total: 209 mg/dL — ABNORMAL HIGH (ref 100–199)
HDL: 79 mg/dL (ref >39)
LDL Chol Calc (NIH): 118 mg/dL — ABNORMAL HIGH (ref 0–99)
Triglycerides: 69 mg/dL (ref 0–149)
VLDL Cholesterol Cal: 12 mg/dL (ref 5–40)

## 2024-03-21 LAB — SEDIMENTATION RATE: Sed Rate: 47 mm/h — ABNORMAL HIGH (ref 0–40)

## 2024-03-21 LAB — VITAMIN D 25 HYDROXY (VIT D DEFICIENCY, FRACTURES): Vit D, 25-Hydroxy: 20.3 ng/mL — ABNORMAL LOW (ref 30.0–100.0)

## 2024-03-23 LAB — CMP14+EGFR
ALT: 15 IU/L (ref 0–32)
AST: 19 IU/L (ref 0–40)
Albumin: 4.3 g/dL (ref 3.9–4.9)
Alkaline Phosphatase: 111 IU/L (ref 44–121)
BUN/Creatinine Ratio: 30 — ABNORMAL HIGH (ref 12–28)
BUN: 18 mg/dL (ref 8–27)
Bilirubin Total: 0.3 mg/dL (ref 0.0–1.2)
CO2: 19 mmol/L — ABNORMAL LOW (ref 20–29)
Calcium: 9.5 mg/dL (ref 8.7–10.3)
Chloride: 105 mmol/L (ref 96–106)
Creatinine, Ser: 0.61 mg/dL (ref 0.57–1.00)
Globulin, Total: 2.4 g/dL (ref 1.5–4.5)
Glucose: 93 mg/dL (ref 70–99)
Potassium: 4.2 mmol/L (ref 3.5–5.2)
Sodium: 141 mmol/L (ref 134–144)
Total Protein: 6.7 g/dL (ref 6.0–8.5)
eGFR: 100 mL/min/{1.73_m2} (ref >59)

## 2024-03-23 LAB — IRON,TIBC AND FERRITIN PANEL
Ferritin: 310 ng/mL — ABNORMAL HIGH (ref 15–150)
Iron Saturation: 26 % (ref 15–55)
Iron: 84 ug/dL (ref 27–139)
Total Iron Binding Capacity: 324 ug/dL (ref 250–450)
UIBC: 240 ug/dL (ref 118–369)

## 2024-03-23 LAB — B12 AND FOLATE PANEL
Folate: 8 ng/mL (ref >3.0)
Vitamin B-12: 468 pg/mL (ref 232–1245)

## 2024-03-23 LAB — ANTI-DNA ANTIBODY, DOUBLE-STRANDED: dsDNA Ab: <1 [IU]/mL (ref 0–9)

## 2024-03-23 LAB — SPECIMEN STATUS REPORT

## 2024-03-26 ENCOUNTER — Ambulatory Visit (HOSPITAL_COMMUNITY)
Admission: RE | Admit: 2024-03-26 | Discharge: 2024-03-26 | Disposition: A | Discharge status: Home / Self Care | Payer: Medicaid Other | Source: Ambulatory Visit | Attending: Family Medicine | Admitting: Family Medicine

## 2024-03-26 DIAGNOSIS — Z780 Asymptomatic menopausal state: Secondary | ICD-10-CM | POA: Diagnosis not present

## 2024-03-26 DIAGNOSIS — Z1231 Encounter for screening mammogram for malignant neoplasm of breast: Secondary | ICD-10-CM | POA: Insufficient documentation

## 2024-03-26 DIAGNOSIS — M85852 Other specified disorders of bone density and structure, left thigh: Secondary | ICD-10-CM | POA: Diagnosis not present

## 2024-03-27 ENCOUNTER — Other Ambulatory Visit (HOSPITAL_COMMUNITY): Payer: Self-pay | Admitting: Family Medicine

## 2024-03-27 DIAGNOSIS — R928 Other abnormal and inconclusive findings on diagnostic imaging of breast: Secondary | ICD-10-CM

## 2024-03-29 ENCOUNTER — Ambulatory Visit (HOSPITAL_COMMUNITY)
Admission: RE | Admit: 2024-03-29 | Discharge: 2024-03-29 | Disposition: A | Discharge status: Home / Self Care | Source: Ambulatory Visit | Attending: Family Medicine | Admitting: Family Medicine

## 2024-03-29 ENCOUNTER — Encounter: Payer: Self-pay | Admitting: Family Medicine

## 2024-03-29 ENCOUNTER — Ambulatory Visit (INDEPENDENT_AMBULATORY_CARE_PROVIDER_SITE_OTHER): Payer: Medicaid Other | Admitting: Family Medicine

## 2024-03-29 ENCOUNTER — Encounter (HOSPITAL_COMMUNITY): Payer: Self-pay

## 2024-03-29 VITALS — BP 120/80 | HR 76 | Resp 16 | Ht 65.0 in | Wt 166.0 lb

## 2024-03-29 DIAGNOSIS — E663 Overweight: Secondary | ICD-10-CM | POA: Diagnosis not present

## 2024-03-29 DIAGNOSIS — E785 Hyperlipidemia, unspecified: Secondary | ICD-10-CM | POA: Diagnosis not present

## 2024-03-29 DIAGNOSIS — R92333 Mammographic heterogeneous density, bilateral breasts: Secondary | ICD-10-CM | POA: Diagnosis not present

## 2024-03-29 DIAGNOSIS — R238 Other skin changes: Secondary | ICD-10-CM | POA: Diagnosis not present

## 2024-03-29 DIAGNOSIS — D649 Anemia, unspecified: Secondary | ICD-10-CM

## 2024-03-29 DIAGNOSIS — I10 Essential (primary) hypertension: Secondary | ICD-10-CM | POA: Diagnosis not present

## 2024-03-29 DIAGNOSIS — E559 Vitamin D deficiency, unspecified: Secondary | ICD-10-CM | POA: Diagnosis not present

## 2024-03-29 DIAGNOSIS — R928 Other abnormal and inconclusive findings on diagnostic imaging of breast: Secondary | ICD-10-CM

## 2024-03-29 MED ORDER — AMLODIPINE-OLMESARTAN 10-20 MG PO TABS: 1.0000 | ORAL_TABLET | Freq: Every day | ORAL | 1 refills | Status: DC

## 2024-03-29 NOTE — Progress Notes (Signed)
 Paula Randolph     MRN: [id number]      DOB: 11/01/1961  Chief Complaint  Patient presents with   Hypertension    Follow up     HPI Paula Randolph is here for follow up and re-evaluation of chronic medical conditions, medication management and review of any available recent lab and radiology data.  Preventive health is updated, specifically  Cancer screening and Immunization.   Has additional mammogram imaging later today becUSE OF POSSIBLE ASSYMETRY IN RIGHT BREAST nEW ANEMIA NEEDSGIi EVALUATION ASAP, ASYMPTOAMTIC AS FAR AS GI SYMPTOMS ARE CONCERNED SKIN LESIONS ARE NOT NOTED TO BE SPREADING , NOT GOING AWAY EITHER AND SHE USES A TOPICAL OTC PREP FOR ITCH WHICH PROVIDES RELIEF NO ADVERSE S/E FROM OLMESARTAN   ROS Denies recent fever or chills. Denies sinus pressure, nasal congestion, ear pain or sore throat. Denies chest congestion, productive cough or wheezing. Denies chest pains, palpitations and leg swelling Denies abdominal pain, nausea, vomiting,diarrhea or constipation.   Denies dysuria, frequency, hesitancy or incontinence. Denies joint pain, swelling and limitation in mobility. Denies headaches, seizures, numbness, or tingling. Denies depression, does report anxiety over health Itchy dark rash on skin persists   PE  BP 120/80   Pulse 76   Resp 16   Ht 5\' 5"  (1.651 m)   Wt 166 lb (75.3 kg)   SpO2 98%   BMI 27.62 kg/m   Patient alert and oriented and in no cardiopulmonary distress.  HEENT: No facial asymmetry, EOMI,     Neck supple .  Chest: Clear to auscultation bilaterally.  CVS: S1, S2 no murmurs, no S3.Regular rate.  ABD: Soft non tender.   Ext: No edema  MS: Adequate ROM spine, shoulders, hips and knees.  Skin: Intact, hyperpigmented  macula papular lesions on lower extremities, lower back and buttocks  Psych: Good eye contact, normal affect. Memory intact mildly anxious and depressed appearing.  CNS: CN 2-12 intact, power,  normal throughout.no  focal deficits noted.   Assessment & Plan  Essential hypertension Controlled, no change in medication, except changed to combined tablet same dose DASH diet and commitment to daily physical activity for a minimum of 30 minutes discussed and encouraged, as a part of hypertension management. The importance of attaining a healthy weight is also discussed.     03/29/2024    8:37 AM 03/29/2024    8:05 AM 10/25/2023   10:07 AM 05/17/2023    8:03 AM 05/17/2023    8:02 AM 03/16/2023    8:20 AM 03/16/2023    8:11 AM  BP/Weight  Systolic BP 120 138 120 136 148 128 110  Diastolic BP 80 82 73 82 82 82 69  Wt. (Lbs)  166 164.12  163.12  161.04  BMI  27.62 kg/m2 27.31 kg/m2  27.14 kg/m2  26.8 kg/m2       Vesicular skin lesions Thiazide diuretic discontinued 01/2024 by Dermatology in case lesions are an allergy. Will re evaluate in 3 to 4 months Possibility of autoimmune disease mentioned during discussion with Derm, ESR elevated , antibibody to dsDNA normal On exam lesions appear to be more due to systemic illness than drug reaction  Overweight (BMI 25.0-29.9)  Patient re-educated about  the importance of commitment to a  minimum of 150 minutes of exercise per week as able.  The importance of healthy food choices with portion control discussed, as well as eating regularly and within a 12 hour window most days. The need to choose "clean , green"  food 50 to 75% of the time is discussed, as well as to make water the primary drink and set a goal of 64 ounces water daily.       03/29/2024    8:05 AM 10/25/2023   10:07 AM 05/17/2023    8:02 AM  Weight /BMI  Weight 166 lb 164 lb 1.9 oz 163 lb 1.9 oz  Height 5\' 5"  (1.651 m) 5\' 5"  (1.651 m) 5\' 5"  (1.651 m)  BMI 27.62 kg/m2 27.31 kg/m2 27.14 kg/m2      Vitamin D deficiency Need to start once weekly supplement, level is very low  Dyslipidemia Hyperlipidemia:Low fat diet discussed and encouraged.   Lipid Panel  Lab Results  Component Value  Date   CHOL 209 (H) 03/19/2024   HDL 79 03/19/2024   LDLCALC 118 (H) 03/19/2024   TRIG 69 03/19/2024   CHOLHDL 3.0 10/19/2023     Need to reduce fried and fatty food, increase green , clean eating  Anemia New anemia, elevated ferritin. Needs GI eval asap Denies change in BM , visible blood in stool or black stool, denies epigastric discomfort or reflux symptoms

## 2024-03-29 NOTE — Patient Instructions (Addendum)
 F/U in 4 months, call if you need me sooner  Please have pt enter DPR at checkout none on file  Nurse pls send to [facility] dermatology for office visit of pt in Feb 2025  I will request f/u vsit with Provider once I get the note  I will reach out to GI for colonscopy appt info (Wed, Thursday appt)  Schedule any necessary tests as soon as possible  Increase water six 14 ounce bottles per day  Take calcium in the form of food/ yogurt, 1000 mg daily or bones  It is important that you exercise regularly at least 30 minutes 5 times a week. If you develop chest pain, have severe difficulty breathing, or feel very tired, stop exercising immediately and seek medical attention   Thanks for choosing [facility], we consider it a privelige to serve you.

## 2024-04-01 DIAGNOSIS — D649 Anemia, unspecified: Secondary | ICD-10-CM | POA: Insufficient documentation

## 2024-04-01 NOTE — Assessment & Plan Note (Signed)
 New anemia, elevated ferritin. Needs GI eval asap Denies change in BM , visible blood in stool or black stool, denies epigastric discomfort or reflux symptoms

## 2024-04-01 NOTE — Assessment & Plan Note (Signed)
  Patient re-educated about  the importance of commitment to a  minimum of 150 minutes of exercise per week as able.  The importance of healthy food choices with portion control discussed, as well as eating regularly and within a 12 hour window most days. The need to choose "clean , green" food 50 to 75% of the time is discussed, as well as to make water the primary drink and set a goal of 64 ounces water daily.       03/29/2024    8:05 AM 10/25/2023   10:07 AM 05/17/2023    8:02 AM  Weight /BMI  Weight 166 lb 164 lb 1.9 oz 163 lb 1.9 oz  Height 5\' 5"  (1.651 m) 5\' 5"  (1.651 m) 5\' 5"  (1.651 m)  BMI 27.62 kg/m2 27.31 kg/m2 27.14 kg/m2

## 2024-04-01 NOTE — Assessment & Plan Note (Addendum)
 Thiazide diuretic discontinued 01/2024 by Dermatology in case lesions are an allergy. Will re evaluate in 3 to 4 months Possibility of autoimmune disease mentioned during discussion with Derm, ESR elevated , antibibody to dsDNA normal On exam lesions appear to be more due to systemic illness than drug reaction

## 2024-04-01 NOTE — Assessment & Plan Note (Signed)
 Need to start once weekly supplement, level is very low

## 2024-04-01 NOTE — Assessment & Plan Note (Addendum)
 Controlled, no change in medication, except changed to combined tablet same dose DASH diet and commitment to daily physical activity for a minimum of 30 minutes discussed and encouraged, as a part of hypertension management. The importance of attaining a healthy weight is also discussed.     03/29/2024    8:37 AM 03/29/2024    8:05 AM 10/25/2023   10:07 AM 05/17/2023    8:03 AM 05/17/2023    8:02 AM 03/16/2023    8:20 AM 03/16/2023    8:11 AM  BP/Weight  Systolic BP 120 138 120 136 148 128 110  Diastolic BP 80 82 73 82 82 82 69  Wt. (Lbs)  166 164.12  163.12  161.04  BMI  27.62 kg/m2 27.31 kg/m2  27.14 kg/m2  26.8 kg/m2

## 2024-04-01 NOTE — Assessment & Plan Note (Signed)
 Hyperlipidemia:Low fat diet discussed and encouraged.   Lipid Panel  Lab Results  Component Value Date   CHOL 209 (H) 03/19/2024   HDL 79 03/19/2024   LDLCALC 118 (H) 03/19/2024   TRIG 69 03/19/2024   CHOLHDL 3.0 10/19/2023     Need to reduce fried and fatty food, increase green , clean eating

## 2024-04-10 ENCOUNTER — Other Ambulatory Visit: Payer: Self-pay | Admitting: Family Medicine

## 2024-04-17 ENCOUNTER — Encounter: Payer: Self-pay | Admitting: *Deleted

## 2024-04-23 ENCOUNTER — Telehealth: Payer: Self-pay | Admitting: *Deleted

## 2024-04-23 NOTE — Telephone Encounter (Signed)
  Procedure: COLONOSCOPY  Estimated body mass index is 27.62 kg/m as calculated from the following:   Height as of 03/29/24: 5\' 5"  (1.651 m).   Weight as of 03/29/24: 166 lb (75.3 kg).   Have you had a colonoscopy before?  04/2014, Dr. Riley Cheadle  Do you have family history of colon cancer?  no  Do you have a family history of polyps? no  Previous colonoscopy with polyps removed? yes  Do you have a history colorectal cancer?   no  Are you diabetic?  no  Do you have a prosthetic or mechanical heart valve? no  Do you have a pacemaker/defibrillator?   no  Have you had endocarditis/atrial fibrillation?  no  Do you use supplemental oxygen/CPAP?  no  Have you had joint replacement within the last 12 months?  no  Do you tend to be constipated or have to use laxatives?  no   Do you have history of alcohol use? If yes, how much and how often.  no  Do you have history or are you using drugs? If yes, what do are you  using?  no  Have you ever had a stroke/heart attack?    Have you ever had a heart or other vascular stent placed,?  Do you take weight loss medication? no  female patients,: have you had a hysterectomy?                               are you post menopausal?                                do you still have your menstrual cycle? no    Date of last menstrual period?   Do you take any blood-thinning medications such as: (Plavix, aspirin, Coumadin, Aggrenox, Brilinta, Xarelto, Eliquis, Pradaxa, Savaysa or Effient)?   If yes we need the name, milligram, dosage and who is prescribing doctor:               Current Outpatient Medications  Medication Sig Dispense Refill   amlodipine -olmesartan  (AZOR ) 10-20 MG tablet Take 1 tablet by mouth daily. 90 tablet 1   Vitamin D , Ergocalciferol , (DRISDOL ) 1.25 MG (50000 UNIT) CAPS capsule Take 1 capsule (50,000 Units total) by mouth every 7 (seven) days. 5 capsule 8   UNABLE TO FIND Masectomy Bras and prosthesis x 6 months Dx:D05.11 1  each prn   UNABLE TO FIND Mastectomy bras 6 each 0   No current facility-administered medications for this visit.    No Known Allergies

## 2024-05-02 ENCOUNTER — Ambulatory Visit (INDEPENDENT_AMBULATORY_CARE_PROVIDER_SITE_OTHER): Admitting: Gastroenterology

## 2024-05-02 ENCOUNTER — Encounter: Payer: Self-pay | Admitting: Gastroenterology

## 2024-05-02 VITALS — BP 127/76 | HR 84 | Temp 98.4°F | Ht 65.0 in | Wt 169.4 lb

## 2024-05-02 DIAGNOSIS — L810 Postinflammatory hyperpigmentation: Secondary | ICD-10-CM | POA: Diagnosis not present

## 2024-05-02 DIAGNOSIS — L432 Lichenoid drug reaction: Secondary | ICD-10-CM | POA: Diagnosis not present

## 2024-05-02 DIAGNOSIS — D649 Anemia, unspecified: Secondary | ICD-10-CM

## 2024-05-02 NOTE — Telephone Encounter (Signed)
 Needs office visit given anemia.  Discussed potentially adding EGD to colonoscopy

## 2024-05-02 NOTE — Patient Instructions (Signed)
 We will call to get you scheduled once I ask Dr. Riley Cheadle if he wants to also do an upper endoscopy the day of your colonoscopy to evaluate your anemia.

## 2024-05-02 NOTE — Progress Notes (Signed)
 GI Office Note    Referring Provider: Towanda Fret, MD Primary Care Physician:  Towanda Fret, MD  Primary Gastroenterologist: Rheba Cedar, MD   Chief Complaint   Chief Complaint  Patient presents with   Colonoscopy    Was told to come in to get a colonoscopy scheduled.      History of Present Illness   Paula Randolph is a 63 y.o. female presenting today for further evaluation of anemia at the request of Dr. Rodolph Clap.  March 2025: Vitamin B12 468, folate 8, iron 84, iron sat 26, TIBC 324, ferritin 310, BUN 30, creatinine 0.61, LFTs normal, A1c 4.5, sed rate 47, TSH 2.030, hemoglobin 9.7, hematocrit 31.8, MCV 89, platelets 198,000.  February 2024: Hemoglobin normal at 11.4.  She has had issues with skin rash this past one year. Has seen dermatology. Possible reaction to one of her medications. Sounds like it has been quite an ordeal for her. She is not sure which medication it was but she is no longer taking it. Review of PCP notes, drug of concern was triamterene   From a GI standpoint, BM regular. No melena, brbpr. No abdominal pain. No heartburn. No vomiting. No dysphagia. No weight loss. No NSAIDs/ASA.   Colonoscopy May 2015: - Colonic diverticulosis - Single rectosigmoid polyp removed, prolapse type polyp with intramucosal lymphoid aggregates and prolapse type changes, no adenomatous changes.    Medications   Current Outpatient Medications  Medication Sig Dispense Refill   amlodipine -olmesartan  (AZOR ) 10-20 MG tablet Take 1 tablet by mouth daily. 90 tablet 1   UNABLE TO FIND Masectomy Bras and prosthesis x 6 months Dx:D05.11 1 each prn   UNABLE TO FIND Mastectomy bras 6 each 0   Vitamin D , Ergocalciferol , (DRISDOL ) 1.25 MG (50000 UNIT) CAPS capsule Take 1 capsule (50,000 Units total) by mouth every 7 (seven) days. 5 capsule 8   No current facility-administered medications for this visit.    Allergies   Allergies as of 05/02/2024 - Review  Complete 05/02/2024  Allergen Reaction Noted   Triamterene -hctz  05/02/2024    Past Medical History   Past Medical History:  Diagnosis Date   Allergy    Breast cancer (HCC) 2008   RT LUMPECTOMY and rad tx for DCIS   Cancer (HCC)    right breast    Dyslipidemia    Hypertension    Overweight(278.02)    Personal history of radiation therapy 2008   right breast DCIS   Primary osteoarthritis of both knees    Radiation 2008   RT BREAST CA   Right arm pain    Strain of lumbar spine    Trochanteric bursitis    Vitamin D  deficiency     Past Surgical History   Past Surgical History:  Procedure Laterality Date   ABDOMINAL HYSTERECTOMY     fibroids   APPENDECTOMY     BREAST LUMPECTOMY Right 2008   lumpectomy of calcifications in UOQ. DCIS   BREAST SURGERY Right 2008   partial   COLONOSCOPY N/A 05/10/2014   Procedure: COLONOSCOPY;  Surgeon: Suzette Espy, MD;  [facility]: [facility];  Service: Endoscopy;  Laterality: N/A;  8:30 AM   CYSTOCELE REPAIR N/A 12/13/2017   Procedure: ANTERIOR REPAIR (CYSTOCELE);  Surgeon: Erman Hayward, MD;  Location: [facility];  Service: Urology;  Laterality: N/A;   PUBOVAGINAL SLING N/A 12/13/2017   Procedure: CYSTOSCOPY Gino Lais;  Surgeon: Erman Hayward, MD;  Location: [facility];  Service: Urology;  Laterality: N/A;  right breast      VAGINAL PROLAPSE REPAIR N/A 12/13/2017   Procedure: VAGINAL VAULT SUSPENSION GRAFT;  Surgeon: Erman Hayward, MD;  Location: [facility];  Service: Urology;  Laterality: N/A;    Past Family History   Family History  Problem Relation Age of Onset   Cancer Mother        breast    Breast cancer Mother 40   Diabetes Father    Diabetes Brother    Diabetes Sister     Past Social History   Social History   Socioeconomic History   Marital status: Single    Spouse name: Not on file   Number of children: Not on file   Years of education: Not on file   Highest education level: Not on file   Occupational History   Not on file  Tobacco Use   Smoking status: Never   Smokeless tobacco: Never  Vaping Use   Vaping status: Never Used  Substance and Sexual Activity   Alcohol use: No   Drug use: No   Sexual activity: Yes    Birth control/protection: Surgical  Other Topics Concern   Not on file  Social History Narrative   Not on file   Social Drivers of Health   Financial Resource Strain: Not on file  Food Insecurity: Not on file  Transportation Needs: Not on file  Physical Activity: Not on file  Stress: Not on file  Social Connections: Not on file  Intimate Partner Violence: Not on file    Review of Systems   General: Negative for anorexia, weight loss, fever, chills, fatigue, weakness. Eyes: Negative for vision changes.  ENT: Negative for hoarseness, difficulty swallowing , nasal congestion. CV: Negative for chest pain, angina, palpitations, dyspnea on exertion, peripheral edema.  Respiratory: Negative for dyspnea at rest, dyspnea on exertion, cough, sputum, wheezing.  GI: See history of present illness. GU:  Negative for dysuria, hematuria, urinary incontinence, urinary frequency, nocturnal urination.  MS: Negative for joint pain, low back pain.  Derm: see hpi  Neuro: Negative for weakness, abnormal sensation, seizure, frequent headaches, memory loss,  confusion.  Psych: Negative for anxiety, depression, suicidal ideation, hallucinations.  Endo: Negative for unusual weight change.  Heme: Negative for bruising or bleeding. Allergy: Negative for rash or hives.  Physical Exam   BP 127/76 (BP Location: Right Arm, Patient Position: Sitting, Cuff Size: Normal)   Pulse 84   Temp 98.4 F (36.9 C) (Oral)   Ht 5\' 5"  (1.651 m)   Wt 169 lb 6.4 oz (76.8 kg)   SpO2 99%   BMI 28.19 kg/m    General: Well-nourished, well-developed in no acute distress.  Head: Normocephalic, atraumatic.   Eyes: Conjunctiva pink, no icterus. Mouth: Oropharyngeal mucosa moist and  pink  Neck: Supple without thyromegaly, masses, or lymphadenopathy.  Lungs: Clear to auscultation bilaterally.  Heart: Regular rate and rhythm, no murmurs rubs or gallops.  Abdomen: Bowel sounds are normal, nontender, nondistended, no hepatosplenomegaly or masses,  no abdominal bruits or hernia, no rebound or guarding.   Rectal: not performed Extremities: No lower extremity edema. No clubbing or deformities.  Neuro: Alert and oriented x 4 , grossly normal neurologically.  Skin: Warm and dry, no rash or jaundice.   Psych: Alert and cooperative, normal mood and affect.  Labs   See hpi  Imaging Studies   No results found.  Assessment/Plan:   Normocytic anemia: normal B12/folate/iron, no overt GI bleeding. ?anemia of chronic disease -colonoscopy with Dr. Riley Cheadle.  ASA 2.  I have discussed the risks, alternatives, benefits with regards to but not limited to the risk of reaction to medication, bleeding, infection, perforation and the patient is agreeable to proceed. Written consent to be obtained.  -patient prefers procedure on a Thursday    Kara Mierzejewski S. Harles Lied, MHS, PA-C [facility]

## 2024-05-03 NOTE — Telephone Encounter (Signed)
 Questionnaire from recall, no referral needed

## 2024-05-04 ENCOUNTER — Telehealth: Payer: Self-pay | Admitting: Gastroenterology

## 2024-05-04 NOTE — Telephone Encounter (Signed)
 Patient recently seen in the office  She needs colonoscopy scheduled with Dr. Riley Cheadle.  She has normocytic anemia and due for screening colonoscopy. ASA 2  Please let her know Dr. Riley Cheadle is advising colonoscopy only. No upper endoscopy at this time.

## 2024-05-07 ENCOUNTER — Other Ambulatory Visit: Payer: Self-pay | Admitting: *Deleted

## 2024-05-07 ENCOUNTER — Encounter: Payer: Self-pay | Admitting: *Deleted

## 2024-05-07 MED ORDER — PEG 3350-KCL-NA BICARB-NACL 420 G PO SOLR: 4000.0000 mL | Freq: Once | ORAL | 0 refills | Status: AC

## 2024-05-07 NOTE — Telephone Encounter (Signed)
 Pt informed of providers message and recommendations. Pt has been scheduled for 06/07/24. Instructions mailed and prep sent to pharmacy

## 2024-05-15 ENCOUNTER — Telehealth: Payer: Self-pay | Admitting: Family Medicine

## 2024-05-15 NOTE — Telephone Encounter (Signed)
 2 episodes of slight bloody drainage from around navel, no recall of trauma/ scaryching area, first seen when she woke up 4 days ago, no pus fever, chills or excess pain, persists , has kept area  clean  3 day h/o pelvic/ vaginal pressure and has total pelvic clearance, denies urinary frequency or dysuria. Recommended topical antibioitc  on periumbilical skin twice daily , keep area dry and clean no scratching appt in office if not resolved in 5 dayhas had both vag prolapse repair and cys tocele repair in 2018, may need both urology and gyne eval, will need to schedule in office eval

## 2024-05-30 ENCOUNTER — Ambulatory Visit

## 2024-05-30 VITALS — BP 135/77 | HR 83 | Ht 65.0 in | Wt 169.0 lb

## 2024-05-30 DIAGNOSIS — N8110 Cystocele, unspecified: Secondary | ICD-10-CM

## 2024-05-30 DIAGNOSIS — E559 Vitamin D deficiency, unspecified: Secondary | ICD-10-CM

## 2024-05-30 MED ORDER — VITAMIN D (ERGOCALCIFEROL) 1.25 MG (50000 UNIT) PO CAPS: 50000.0000 [IU] | ORAL_CAPSULE | ORAL | 8 refills | Status: DC

## 2024-05-30 MED ORDER — ACETAMINOPHEN-CODEINE 300-30 MG PO TABS: 1.0000 | ORAL_TABLET | Freq: Four times a day (QID) | ORAL | 0 refills | Status: AC | PRN

## 2024-05-30 NOTE — Progress Notes (Signed)
 Established Patient Office Visit  Subjective   Patient ID: Paula Randolph, female    DOB: Nov 25, 1961  Age: 63 y.o. MRN: [id number]  Chief Complaint  Patient presents with   Medical Management of Chronic Issues    Pt states "Pelvic pain when sitting for about 3 weeks, currently hurting a little"    HPI  Patient presents today with pelvic pain and vaginal area for past 3 weeks.  Pain is worse with sitting.  She has a history of vaginal prolapse cystocele repair in 2018.  She denies any bowel or bladder changes at this time.   Patient Active Problem List   Diagnosis Date Noted   Normocytic anemia 05/02/2024   Anemia 04/01/2024   Encounter for immunization 10/25/2023   Dysuria 10/25/2023   Vesicular skin lesions 05/17/2023   CT (claw toe), left 07/22/2020   Annual physical exam 08/11/2015   Vitamin D  deficiency 02/02/2010   Overweight (BMI 25.0-29.9) 04/20/2009   Dyslipidemia 01/30/2009   Allergic rhinitis 01/30/2009   Ductal carcinoma in situ (DCIS) of right breast 04/12/2008   Essential hypertension 01/05/2008   Past Medical History:  Diagnosis Date   Allergy    Breast cancer (HCC) 2008   RT LUMPECTOMY and rad tx for DCIS   Cancer (HCC)    right breast    Dyslipidemia    Hypertension    Overweight(278.02)    Personal history of radiation therapy 2008   right breast DCIS   Primary osteoarthritis of both knees    Radiation 2008   RT BREAST CA   Right arm pain    Strain of lumbar spine    Trochanteric bursitis    Vitamin D  deficiency       ROS    Objective:     BP 135/77   Pulse 83   Ht 5\' 5"  (1.651 m)   Wt 169 lb (76.7 kg)   SpO2 98%   BMI 28.12 kg/m  Wt Readings from Last 3 Encounters:  05/30/24 169 lb (76.7 kg)  05/02/24 169 lb 6.4 oz (76.8 kg)  03/29/24 166 lb (75.3 kg)     Physical Exam Vitals and nursing note reviewed.  Constitutional:      Appearance: Normal appearance.  Abdominal:     Tenderness: There is no abdominal tenderness.      Hernia: No hernia is present.  Genitourinary:    Exam position: Supine.     Urethra: No prolapse (absent).     Vagina: Tenderness present.     Uterus: Absent.      Comments: Bladder prolapsed into vaginal vault.  Firm and tender with palpation.  Neurological:     Mental Status: She is alert and oriented to person, place, and time.  Psychiatric:        Mood and Affect: Mood normal.        Thought Content: Thought content normal.      No results found for any visits on 05/30/24.    The 10-year ASCVD risk score (Arnett DK, et al., 2019) is: 9.2%    Assessment & Plan:   Problem List Items Addressed This Visit       Other   Vitamin D  deficiency   Relevant Medications   Vitamin D , Ergocalciferol , (DRISDOL ) 1.25 MG (50000 UNIT) CAPS capsule   Other Visit Diagnoses       Cystocele without uterine prolapse    -  Primary   Refer to urology for further evaluation and treatment.  Tylenol  3 added  for pain relief.   Relevant Medications   acetaminophen -codeine (TYLENOL  #3) 300-30 MG tablet   Other Relevant Orders   Ambulatory referral to Urology       No follow-ups on file.    Alison Irvine, FNP

## 2024-06-01 NOTE — Telephone Encounter (Signed)
 [employer] PA:  In progress The prior authorization/notification reference number is: [id number]. The prior authorization/notification case information was transmitted on 06/01/2024 at 08:17 AM CDT .

## 2024-06-06 NOTE — Anesthesia Preprocedure Evaluation (Addendum)
 Anesthesia Evaluation  Patient identified by MRN, date of birth, ID band Patient awake    Reviewed: Allergy & Precautions, NPO status , Patient's Chart, lab work & pertinent test results  Airway Mallampati: I       Dental  (+) Edentulous Upper, Edentulous Lower   Pulmonary neg pulmonary ROS   Pulmonary exam normal breath sounds clear to auscultation       Cardiovascular hypertension, Pt. on medications Normal cardiovascular exam Rhythm:Regular Rate:Normal     Neuro/Psych  Neuromuscular disease  negative psych ROS   GI/Hepatic negative GI ROS, Neg liver ROS,,,  Endo/Other  negative endocrine ROS    Renal/GU negative Renal ROS  Female GU complaint     Musculoskeletal  (+) Arthritis , Osteoarthritis,    Abdominal Normal abdominal exam  (+)   Peds  Hematology  (+) Blood dyscrasia, anemia Hgb 9.7   Anesthesia Other Findings Cancer with radiation  Reproductive/Obstetrics                             Anesthesia Physical Anesthesia Plan  ASA: 3  Anesthesia Plan: General   Post-op Pain Management: Minimal or no pain anticipated   Induction: Intravenous  PONV Risk Score and Plan: Propofol  infusion  Airway Management Planned: Nasal Cannula and Natural Airway  Additional Equipment: None  Intra-op Plan:   Post-operative Plan:   Informed Consent: I have reviewed the patients History and Physical, chart, labs and discussed the procedure including the risks, benefits and alternatives for the proposed anesthesia with the patient or authorized representative who has indicated his/her understanding and acceptance.     Dental advisory given  Plan Discussed with: CRNA  Anesthesia Plan Comments:         Anesthesia Quick Evaluation

## 2024-06-07 ENCOUNTER — Encounter (HOSPITAL_COMMUNITY): Payer: Self-pay | Admitting: Internal Medicine

## 2024-06-07 ENCOUNTER — Other Ambulatory Visit: Payer: Self-pay

## 2024-06-07 ENCOUNTER — Encounter (HOSPITAL_COMMUNITY)
Admission: RE | Disposition: A | Discharge status: Home / Self Care | Payer: Self-pay | Source: Ambulatory Visit | Attending: Internal Medicine

## 2024-06-07 ENCOUNTER — Ambulatory Visit (HOSPITAL_COMMUNITY): Admitting: Anesthesiology

## 2024-06-07 ENCOUNTER — Ambulatory Visit (HOSPITAL_COMMUNITY)
Admission: RE | Admit: 2024-06-07 | Discharge: 2024-06-07 | Disposition: A | Discharge status: Home / Self Care | Source: Ambulatory Visit | Attending: Internal Medicine | Admitting: Internal Medicine

## 2024-06-07 DIAGNOSIS — K649 Unspecified hemorrhoids: Secondary | ICD-10-CM

## 2024-06-07 DIAGNOSIS — Z1211 Encounter for screening for malignant neoplasm of colon: Secondary | ICD-10-CM

## 2024-06-07 DIAGNOSIS — K5730 Diverticulosis of large intestine without perforation or abscess without bleeding: Secondary | ICD-10-CM | POA: Insufficient documentation

## 2024-06-07 DIAGNOSIS — D649 Anemia, unspecified: Secondary | ICD-10-CM | POA: Insufficient documentation

## 2024-06-07 DIAGNOSIS — I10 Essential (primary) hypertension: Secondary | ICD-10-CM | POA: Insufficient documentation

## 2024-06-07 DIAGNOSIS — M1990 Unspecified osteoarthritis, unspecified site: Secondary | ICD-10-CM | POA: Insufficient documentation

## 2024-06-07 DIAGNOSIS — K644 Residual hemorrhoidal skin tags: Secondary | ICD-10-CM

## 2024-06-07 HISTORY — PX: COLONOSCOPY: SHX5424

## 2024-06-07 SURGERY — COLONOSCOPY
Anesthesia: General

## 2024-06-07 MED ORDER — PROPOFOL 10 MG/ML IV BOLUS
INTRAVENOUS | Status: DC | PRN
  Administered 2024-06-07: 50 mg via INTRAVENOUS
  Administered 2024-06-07: 90 mg via INTRAVENOUS

## 2024-06-07 MED ORDER — PROPOFOL 500 MG/50ML IV EMUL
INTRAVENOUS | Status: DC | PRN
Start: 2024-06-07 — End: 2024-06-07
  Administered 2024-06-07: 150 ug/kg/min via INTRAVENOUS

## 2024-06-07 MED ORDER — LACTATED RINGERS IV SOLN: INTRAVENOUS | Status: DC

## 2024-06-07 NOTE — Anesthesia Postprocedure Evaluation (Signed)
 Anesthesia Post Note  Patient: Paula Randolph  Procedure(s) Performed: COLONOSCOPY  Patient location during evaluation: Endoscopy Anesthesia Type: General Level of consciousness: awake and alert Pain management: pain level controlled Vital Signs Assessment: post-procedure vital signs reviewed and stable Respiratory status: spontaneous breathing Cardiovascular status: stable Postop Assessment: no apparent nausea or vomiting Anesthetic complications: no   No notable events documented.   Last Vitals:  Vitals:   06/07/24 0722 06/07/24 0856  BP: (!) 142/82 103/70  Pulse: 81 87  Resp: 18 (!) 23  Temp: 36.4 C 36.4 C  SpO2: 98% 98%    Last Pain:  Vitals:   06/07/24 0856  TempSrc: Oral  PainSc: 0-No pain                 Brigetta Beckstrom

## 2024-06-07 NOTE — Transfer of Care (Addendum)
 Immediate Anesthesia Transfer of Care Note  Patient: Paula Randolph  Procedure(s) Performed: COLONOSCOPY  Patient Location: PACU and Endoscopy Unit  Anesthesia Type:General  Level of Consciousness: awake  Airway & Oxygen Therapy: Patient Spontanous Breathing  Post-op Assessment: Report given to RN  Post vital signs: Reviewed and stable  Last Vitals:  Vitals Value Taken Time  BP 103/76   Temp 36.4   Pulse 87   Resp 23   SpO2 98     Last Pain:  Vitals:   06/07/24 0830  TempSrc:   PainSc: 0-No pain      Patients Stated Pain Goal: 10 (06/07/24 0722)  Complications: No notable events documented.

## 2024-06-07 NOTE — H&P (Signed)
 @LOGO @   Primary Care Physician:  Towanda Fret, MD Primary Gastroenterologist:  Dr. Riley Cheadle  Pre-Procedure History & Physical: HPI:  Paula Randolph is a 63 y.o. female here for screening colonoscopy colonoscopy 10 years ago yielded prolapse type polyp.  No bowel symptoms.  No family history of colon cancer.  Past Medical History:  Diagnosis Date   Allergy    Breast cancer (HCC) 2008   RT LUMPECTOMY and rad tx for DCIS   Cancer (HCC)    right breast    Dyslipidemia    Hypertension    Overweight(278.02)    Personal history of radiation therapy 2008   right breast DCIS   Primary osteoarthritis of both knees    Radiation 2008   RT BREAST CA   Right arm pain    Strain of lumbar spine    Trochanteric bursitis    Vitamin D  deficiency     Past Surgical History:  Procedure Laterality Date   ABDOMINAL HYSTERECTOMY     fibroids   APPENDECTOMY     BREAST LUMPECTOMY Right 2008   lumpectomy of calcifications in UOQ. DCIS   BREAST SURGERY Right 2008   partial   COLONOSCOPY N/A 05/10/2014   Procedure: COLONOSCOPY;  Surgeon: Suzette Espy, MD;  [facility];  Service: Endoscopy;  Laterality: N/A;  8:30 AM   CYSTOCELE REPAIR N/A 12/13/2017   Procedure: ANTERIOR REPAIR (CYSTOCELE);  Surgeon: Erman Hayward, MD;  Location: [facility];  Service: Urology;  Laterality: N/A;   PUBOVAGINAL SLING N/A 12/13/2017   Procedure: CYSTOSCOPY Gino Lais;  Surgeon: Erman Hayward, MD;  Location: [facility];  Service: Urology;  Laterality: N/A;   right breast      VAGINAL PROLAPSE REPAIR N/A 12/13/2017   Procedure: VAGINAL VAULT SUSPENSION GRAFT;  Surgeon: Erman Hayward, MD;  Location: [facility];  Service: Urology;  Laterality: N/A;    Prior to Admission medications   Medication Sig Start Date End Date Taking? Authorizing Provider  amlodipine -olmesartan  (AZOR ) 10-20 MG tablet Take 1 tablet by mouth daily. 03/29/24  Yes Towanda Fret, MD  Vitamin D , Ergocalciferol ,  (DRISDOL ) 1.25 MG (50000 UNIT) CAPS capsule Take 1 capsule (50,000 Units total) by mouth every 7 (seven) days. 05/30/24  Yes Alison Irvine, FNP  UNABLE TO FIND Masectomy Bras and prosthesis x 6 months Dx:D05.11 06/01/17   Towanda Fret, MD  UNABLE TO FIND Mastectomy bras 08/21/19   Towanda Fret, MD    Allergies as of 05/07/2024 - Review Complete 05/02/2024  Allergen Reaction Noted   Triamterene -hctz  05/02/2024    Family History  Problem Relation Age of Onset   Cancer Mother        breast    Breast cancer Mother 37   Diabetes Father    Diabetes Brother    Diabetes Sister     Social History   Socioeconomic History   Marital status: Single    Spouse name: Not on file   Number of children: Not on file   Years of education: Not on file   Highest education level: Not on file  Occupational History   Not on file  Tobacco Use   Smoking status: Never   Smokeless tobacco: Never  Vaping Use   Vaping status: Never Used  Substance and Sexual Activity   Alcohol use: No   Drug use: No   Sexual activity: Yes    Birth control/protection: Surgical  Other Topics Concern   Not on file  Social History Narrative  Not on file   Social Drivers of Health   Financial Resource Strain: Not on file  Food Insecurity: Not on file  Transportation Needs: Not on file  Physical Activity: Not on file  Stress: Not on file  Social Connections: Not on file  Intimate Partner Violence: Not on file    Review of Systems: See HPI, otherwise negative ROS  Physical Exam: BP (!) 142/82   Pulse 81   Temp 97.6 F (36.4 C) (Oral)   Resp 18   Ht 5' 5 (1.651 m)   Wt 76.7 kg   SpO2 98%   BMI 28.12 kg/m  General:   Alert,  Well-developed, well-nourished, pleasant and cooperative in NAD Skin:  Intact without significant lesions or rashes. Neck:  Supple; no masses or thyromegaly. No significant cervical adenopathy. Lungs:  Clear throughout to auscultation.   No wheezes, crackles, or  rhonchi. No acute distress. Heart:  Regular rate and rhythm; no murmurs, clicks, rubs,  or gallops. Abdomen: Non-distended, normal bowel sounds.  Soft and nontender without appreciable mass or hepatosplenomegaly.   Impression/Plan: 63 year old lady here for average risk screening colonoscopy. The risks, benefits, limitations, alternatives and imponderables have been reviewed with the patient. Questions have been answered. All parties are agreeable.       Notice: This dictation was prepared with Dragon dictation along with smaller phrase technology. Any transcriptional errors that result from this process are unintentional and may not be corrected upon review.

## 2024-06-07 NOTE — Discharge Instructions (Addendum)
  Colonoscopy Discharge Instructions  Read the instructions outlined below and refer to this sheet in the next few weeks. These discharge instructions provide you with general information on caring for yourself after you leave the hospital. Your doctor may also give you specific instructions. While your treatment has been planned according to the most current medical practices available, unavoidable complications occasionally occur. If you have any problems or questions after discharge, call Dr. Riley Cheadle at [phone]. ACTIVITY You may resume your regular activity, but move at a slower pace for the next 24 hours.  Take frequent rest periods for the next 24 hours.  Walking will help get rid of the air and reduce the bloated feeling in your belly (abdomen).  No driving for 24 hours (because of the medicine (anesthesia) used during the test).   Do not sign any important legal documents or operate any machinery for 24 hours (because of the anesthesia used during the test).  NUTRITION Drink plenty of fluids.  You may resume your normal diet as instructed by your doctor.  Begin with a light meal and progress to your normal diet. Heavy or fried foods are harder to digest and may make you feel sick to your stomach (nauseated).  Avoid alcoholic beverages for 24 hours or as instructed.  MEDICATIONS You may resume your normal medications unless your doctor tells you otherwise.  WHAT YOU CAN EXPECT TODAY Some feelings of bloating in the abdomen.  Passage of more gas than usual.  Spotting of blood in your stool or on the toilet paper.  IF YOU HAD POLYPS REMOVED DURING THE COLONOSCOPY: No aspirin products for 7 days or as instructed.  No alcohol for 7 days or as instructed.  Eat a soft diet for the next 24 hours.  FINDING OUT THE RESULTS OF YOUR TEST Not all test results are available during your visit. If your test results are not back during the visit, make an appointment with your caregiver to find out the  results. Do not assume everything is normal if you have not heard from your caregiver or the medical facility. It is important for you to follow up on all of your test results.  SEEK IMMEDIATE MEDICAL ATTENTION IF: You have more than a spotting of blood in your stool.  Your belly is swollen (abdominal distention).  You are nauseated or vomiting.  You have a temperature over 101.  You have abdominal pain or discomfort that is severe or gets worse throughout the day.     No polyps found today!  You do have diverticulosis  Diverticulosis information provided   it is recommended you return for 1 more screening colonoscopy in 10 years

## 2024-06-07 NOTE — Op Note (Signed)
 [facility] Patient Name: Paula Randolph Procedure Date: 06/07/2024 8:10 AM MRN: [id number] Date of Birth: [birth year] Attending MD: Gemma Kelp , MD, [id number] CSN: [id number] Age: 63 Admit Type: Outpatient Procedure:                Colonoscopy Indications:              Screening for colorectal malignant neoplasm Providers:                Gemma Kelp, MD, Paula Randolph. Paula Lister RN, RN,                            Paula Randolph Referring MD:              Medicines:                Propofol  per Anesthesia Complications:            No immediate complications. Estimated Blood Loss:     Estimated blood loss: none. Procedure:                Pre-Anesthesia Assessment:                           - Prior to the procedure, a History and Physical                            was performed, and patient medications and                            allergies were reviewed. The patient's tolerance of                            previous anesthesia was also reviewed. The risks                            and benefits of the procedure and the sedation                            options and risks were discussed with the patient.                            All questions were answered, and informed consent                            was obtained. Prior Anticoagulants: The patient has                            taken no anticoagulant or antiplatelet agents. ASA                            Grade Assessment: II - A patient with mild systemic                            disease. After reviewing the risks and benefits,  the patient was deemed in satisfactory condition to                            undergo the procedure.                           After obtaining informed consent, the colonoscope                            was passed under direct vision. Throughout the                            procedure, the patient's blood pressure, pulse, and                             oxygen saturations were monitored continuously. The                            [id number] scope was introduced through the                            anus and advanced to the the cecum, identified by                            appendiceal orifice and ileocecal valve. The                            colonoscopy was performed without difficulty. The                            patient tolerated the procedure well. The quality                            of the bowel preparation was adequate. The                            ileocecal valve, appendiceal orifice, and rectum                            were photographed. The entire colon was well                            visualized. The colonoscopy was performed without                            difficulty. The patient tolerated the procedure                            well. The quality of the bowel preparation was                            adequate. Scope In: 8:35:52 AM Scope Out: 8:50:05 AM Scope Withdrawal Time: 0 hours 6 minutes 47 seconds  Total Procedure Duration: 0 hours 14 minutes 13 seconds  Findings:  Hemorrhoids were found on perianal exam.      Multiple medium-mouthed diverticula were found in the entire colon.      The exam was otherwise without abnormality on direct and retroflexion       views. Impression:               - Hemorrhoids found on perianal exam.                           - Diverticulosis in the entire examined colon.                           - The examination was otherwise normal on direct                            and retroflexion views.                           - No specimens collected. Moderate Sedation:      Moderate (conscious) sedation was personally administered by an       anesthesia professional. The following parameters were monitored: oxygen       saturation, heart rate, blood pressure, respiratory rate, EKG, adequacy       of pulmonary ventilation, and response to care. Recommendation:            - Patient has a contact number available for                            emergencies. The signs and symptoms of potential                            delayed complications were discussed with the                            patient. Return to normal activities tomorrow.                            Written discharge instructions were provided to the                            patient.                           - Advance diet as tolerated.                           - Continue present medications.                           - Repeat colonoscopy in 10 years for screening                            purposes.                           - Return to GI office (date not yet determined). Procedure Code(s):        --- Professional ---  40981, Colonoscopy, flexible; diagnostic, including                            collection of specimen(s) by brushing or washing,                            when performed (separate procedure) Diagnosis Code(s):        --- Professional ---                           Z12.11, Encounter for screening for malignant                            neoplasm of colon                           K64.9, Unspecified hemorrhoids                           K57.30, Diverticulosis of large intestine without                            perforation or abscess without bleeding CPT copyright 2022 [employer] Association. All rights reserved. The codes documented in this report are preliminary and upon coder review may  be revised to meet current compliance requirements. Windsor Hatcher. Cicero Noy, MD Gemma Kelp, MD 06/07/2024 8:56:07 AM This report has been signed electronically. Number of Addenda: 0

## 2024-06-08 ENCOUNTER — Encounter (HOSPITAL_COMMUNITY): Payer: Self-pay | Admitting: Internal Medicine

## 2024-06-25 ENCOUNTER — Other Ambulatory Visit: Payer: Self-pay | Admitting: Family Medicine

## 2024-06-25 DIAGNOSIS — N8110 Cystocele, unspecified: Secondary | ICD-10-CM

## 2024-07-10 ENCOUNTER — Other Ambulatory Visit: Payer: Self-pay | Admitting: Family Medicine

## 2024-07-10 MED ORDER — CETIRIZINE HCL 10 MG PO TABS
10.0000 mg | ORAL_TABLET | Freq: Every day | ORAL | 11 refills | Status: DC
Start: 2024-07-10 — End: 2024-08-02

## 2024-07-15 ENCOUNTER — Other Ambulatory Visit: Payer: Self-pay | Admitting: Family Medicine

## 2024-08-02 ENCOUNTER — Ambulatory Visit (INDEPENDENT_AMBULATORY_CARE_PROVIDER_SITE_OTHER): Payer: Self-pay | Admitting: Family Medicine

## 2024-08-02 ENCOUNTER — Encounter: Payer: Self-pay | Admitting: Family Medicine

## 2024-08-02 VITALS — BP 128/82 | HR 88 | Resp 16 | Ht 66.0 in | Wt 171.1 lb

## 2024-08-02 DIAGNOSIS — J3089 Other allergic rhinitis: Secondary | ICD-10-CM | POA: Diagnosis not present

## 2024-08-02 DIAGNOSIS — E559 Vitamin D deficiency, unspecified: Secondary | ICD-10-CM

## 2024-08-02 DIAGNOSIS — L819 Disorder of pigmentation, unspecified: Secondary | ICD-10-CM | POA: Diagnosis not present

## 2024-08-02 DIAGNOSIS — I10 Essential (primary) hypertension: Secondary | ICD-10-CM

## 2024-08-02 DIAGNOSIS — E785 Hyperlipidemia, unspecified: Secondary | ICD-10-CM

## 2024-08-02 DIAGNOSIS — E663 Overweight: Secondary | ICD-10-CM | POA: Diagnosis not present

## 2024-08-02 DIAGNOSIS — D649 Anemia, unspecified: Secondary | ICD-10-CM | POA: Diagnosis not present

## 2024-08-02 MED ORDER — AMLODIPINE-OLMESARTAN 10-20 MG PO TABS: 1.0000 | ORAL_TABLET | Freq: Every day | ORAL | 3 refills | Status: AC

## 2024-08-02 MED ORDER — CETIRIZINE HCL 10 MG PO TABS: 10.0000 mg | ORAL_TABLET | Freq: Every day | ORAL | 1 refills | Status: AC

## 2024-08-02 NOTE — Assessment & Plan Note (Addendum)
 Hyperlipidemia:Low fat diet discussed and encouraged.   Lipid Panel  Lab Results  Component Value Date   CHOL 209 (H) 03/19/2024   HDL 79 03/19/2024   LDLCALC 118 (H) 03/19/2024   TRIG 69 03/19/2024   CHOLHDL 3.0 10/19/2023     Updated lab needed at/ before next visit.

## 2024-08-02 NOTE — Progress Notes (Signed)
 Paula Randolph     MRN: [id number]      DOB: 11-11-1961  Chief Complaint  Patient presents with   Hypertension    4 month follow up     HPI Paula Randolph is here for follow up and re-evaluation of chronic medical conditions, medication management and review of any available recent lab and radiology data.  Preventive health is updated, specifically  Cancer screening and Immunization.   Has upcoming Urology appointment The PT denies any adverse reactions to current medications since the last visit.  Rash on skin becoming more extensive, involves trunk, upper and lower extremities, at times itches, will need re evaluation by dermatology Sleep is much improved with bedtime zyrtec  as well as the itching Needs to start exercising ROS Denies recent fever or chills. Denies sinus pressure, nasal congestion, ear pain or sore throat. Denies chest congestion, productive cough or wheezing. Denies chest pains, palpitations and leg swelling Denies abdominal pain, nausea, vomiting,diarrhea or constipation.   Denies dysuria, frequency, hesitancy or incontinence. Denies joint pain, swelling and limitation in mobility. Denies headaches, seizures, numbness, or tingling. Denies depression, anxiety or insomnia.  PE  BP 128/82   Pulse 88   Resp 16   Ht 5' 6 (1.676 m)   Wt 171 lb 1.3 oz (77.6 kg)   SpO2 98%   BMI 27.61 kg/m   Patient alert and oriented and in no cardiopulmonary distress.  HEENT: No facial asymmetry, EOMI,     Neck supple .  Chest: Clear to auscultation bilaterally.  CVS: S1, S2 no murmurs, no S3.Regular rate.  ABD: Soft non tender.   Ext: No edema  MS: Adequate ROM spine, shoulders, hips and knees.  Skin: Intact, multiple hyperpigmented macular skin lesions affecting trunk and extremities, increased in number since past 6 months Psych: Good eye contact, normal affect. Memory intact not anxious or depressed appearing.  CNS: CN 2-12 intact, power,  normal throughout.no  focal deficits noted.   Assessment & Plan  Essential hypertension Controlled, no change in medication DASH diet and commitment to daily physical activity for a minimum of 30 minutes discussed and encouraged, as a part of hypertension management. The importance of attaining a healthy weight is also discussed.     08/02/2024    8:38 AM 08/02/2024    8:04 AM 06/07/2024    8:56 AM 06/07/2024    7:22 AM 05/30/2024    2:04 PM 05/02/2024    2:53 PM 05/02/2024    2:51 PM  BP/Weight  Systolic BP 128 147 103 142 135 127 156  Diastolic BP 82 79 70 82 77 76 74  Wt. (Lbs)  171.08  169 169  169.4  BMI  27.61 kg/m2  28.12 kg/m2 28.12 kg/m2  28.19 kg/m2       Overweight (BMI 25.0-29.9)  Patient re-educated about  the importance of commitment to a  minimum of 150 minutes of exercise per week as able.  The importance of healthy food choices with portion control discussed, as well as eating regularly and within a 12 hour window most days. The need to choose clean , green food 50 to 75% of the time is discussed, as well as to make water  the primary drink and set a goal of 64 ounces water  daily.       08/02/2024    8:04 AM 06/07/2024    7:22 AM 05/30/2024    2:04 PM  Weight /BMI  Weight 171 lb 1.3 oz 169 lb 169 lb  Height 5' 6 (1.676 m) 5' 5 (1.651 m) 5' 5 (1.651 m)  BMI 27.61 kg/m2 28.12 kg/m2 28.12 kg/m2    Will start exercise program  Allergic rhinitis Controlled and improved sleep with bedtime zyrtec   Dyslipidemia Hyperlipidemia:Low fat diet discussed and encouraged.   Lipid Panel  Lab Results  Component Value Date   CHOL 209 (H) 03/19/2024   HDL 79 03/19/2024   LDLCALC 118 (H) 03/19/2024   TRIG 69 03/19/2024   CHOLHDL 3.0 10/19/2023     Updated lab needed at/ before next visit.   Vitamin D  deficiency Updated lab needed at/ before next visit. Currently on weekly supplement  Hyperpigmented skin lesion Increased number and size of lwesions, affecting trunk, upper and  lower extremities, Needs re eval by Dermatology

## 2024-08-02 NOTE — Assessment & Plan Note (Signed)
 Controlled, no change in medication DASH diet and commitment to daily physical activity for a minimum of 30 minutes discussed and encouraged, as a part of hypertension management. The importance of attaining a healthy weight is also discussed.     08/02/2024    8:38 AM 08/02/2024    8:04 AM 06/07/2024    8:56 AM 06/07/2024    7:22 AM 05/30/2024    2:04 PM 05/02/2024    2:53 PM 05/02/2024    2:51 PM  BP/Weight  Systolic BP 128 147 103 142 135 127 156  Diastolic BP 82 79 70 82 77 76 74  Wt. (Lbs)  171.08  169 169  169.4  BMI  27.61 kg/m2  28.12 kg/m2 28.12 kg/m2  28.19 kg/m2

## 2024-08-02 NOTE — Assessment & Plan Note (Signed)
 Increased number and size of lwesions, affecting trunk, upper and lower extremities, Needs re eval by Dermatology

## 2024-08-02 NOTE — Assessment & Plan Note (Signed)
  Patient re-educated about  the importance of commitment to a  minimum of 150 minutes of exercise per week as able.  The importance of healthy food choices with portion control discussed, as well as eating regularly and within a 12 hour window most days. The need to choose clean , green food 50 to 75% of the time is discussed, as well as to make water  the primary drink and set a goal of 64 ounces water  daily.       08/02/2024    8:04 AM 06/07/2024    7:22 AM 05/30/2024    2:04 PM  Weight /BMI  Weight 171 lb 1.3 oz 169 lb 169 lb  Height 5' 6 (1.676 m) 5' 5 (1.651 m) 5' 5 (1.651 m)  BMI 27.61 kg/m2 28.12 kg/m2 28.12 kg/m2    Will start exercise program

## 2024-08-02 NOTE — Assessment & Plan Note (Signed)
 Updated lab needed at/ before next visit. Currently on weekly supplement

## 2024-08-02 NOTE — Patient Instructions (Addendum)
 Annual exam in January  CBC , bmp and Egfr  and anemia panel today  Fasting CBC, lipid , cmp and EGFr , TSH and vit D 3 to 5 days before January ppt      Pls call and get a f/u with Dermatology since the rash is  getting worse , I will also send a message  Keep up great health habits and start exercising/walking for 30 mins at least 5 days per week

## 2024-08-02 NOTE — Assessment & Plan Note (Signed)
 Controlled and improved sleep with bedtime zyrtec 

## 2024-08-03 LAB — BMP8+EGFR
BUN/Creatinine Ratio: 19 (ref 12–28)
BUN: 14 mg/dL (ref 8–27)
CO2: 20 mmol/L (ref 20–29)
Calcium: 9.9 mg/dL (ref 8.7–10.3)
Chloride: 103 mmol/L (ref 96–106)
Creatinine, Ser: 0.72 mg/dL (ref 0.57–1.00)
Glucose: 69 mg/dL — ABNORMAL LOW (ref 70–99)
Potassium: 4.2 mmol/L (ref 3.5–5.2)
Sodium: 141 mmol/L (ref 134–144)
eGFR: 93 mL/min/1.73 (ref >59)

## 2024-08-03 LAB — ANEMIA PROFILE B
Basophils Absolute: 0.1 x10E3/uL (ref 0.0–0.2)
Basos: 1 %
EOS (ABSOLUTE): 0.6 x10E3/uL — ABNORMAL HIGH (ref 0.0–0.4)
Eos: 7 %
Ferritin: 235 ng/mL — ABNORMAL HIGH (ref 15–150)
Folate: 8.7 ng/mL (ref >3.0)
Hematocrit: 33.6 % — ABNORMAL LOW (ref 34.0–46.6)
Hemoglobin: 10 g/dL — ABNORMAL LOW (ref 11.1–15.9)
Immature Grans (Abs): 0 x10E3/uL (ref 0.0–0.1)
Immature Granulocytes: 0 %
Iron Saturation: 43 % (ref 15–55)
Iron: 137 ug/dL (ref 27–139)
Lymphocytes Absolute: 1.8 x10E3/uL (ref 0.7–3.1)
Lymphs: 21 %
MCH: 27.9 pg (ref 26.6–33.0)
MCHC: 29.8 g/dL — ABNORMAL LOW (ref 31.5–35.7)
MCV: 94 fL (ref 79–97)
Monocytes Absolute: 0.9 x10E3/uL (ref 0.1–0.9)
Monocytes: 10 %
Neutrophils Absolute: 5.1 x10E3/uL (ref 1.4–7.0)
Neutrophils: 61 %
Platelets: 431 x10E3/uL (ref 150–450)
RBC: 3.58 x10E6/uL — ABNORMAL LOW (ref 3.77–5.28)
RDW: 11.7 % (ref 11.7–15.4)
Retic Ct Pct: 4.7 % — ABNORMAL HIGH (ref 0.6–2.6)
Total Iron Binding Capacity: 318 ug/dL (ref 250–450)
UIBC: 181 ug/dL (ref 118–369)
Vitamin B-12: 588 pg/mL (ref 232–1245)
WBC: 8.4 x10E3/uL (ref 3.4–10.8)

## 2024-08-05 ENCOUNTER — Ambulatory Visit: Payer: Self-pay | Admitting: Family Medicine

## 2024-08-05 NOTE — Addendum Note (Signed)
 Addended by: ANTONETTA QUANT E on: 08/05/2024 01:50 AM   Modules accepted: Orders

## 2024-09-17 DIAGNOSIS — L309 Dermatitis, unspecified: Secondary | ICD-10-CM | POA: Diagnosis not present

## 2024-09-17 DIAGNOSIS — L810 Postinflammatory hyperpigmentation: Secondary | ICD-10-CM | POA: Diagnosis not present

## 2024-10-01 ENCOUNTER — Ambulatory Visit (INDEPENDENT_AMBULATORY_CARE_PROVIDER_SITE_OTHER): Admitting: Urology

## 2024-10-01 VITALS — BP 161/79 | HR 80 | Ht 66.0 in | Wt 171.1 lb

## 2024-10-01 DIAGNOSIS — N8110 Cystocele, unspecified: Secondary | ICD-10-CM

## 2024-10-01 LAB — URINALYSIS, COMPLETE
Bilirubin, UA: NEGATIVE
Glucose, UA: NEGATIVE
Nitrite, UA: POSITIVE — AB
Specific Gravity, UA: 1.02 (ref 1.005–1.030)
Urobilinogen, Ur: 0.2 mg/dL (ref 0.2–1.0)
pH, UA: 6 (ref 5.0–7.5)

## 2024-10-01 LAB — MICROSCOPIC EXAMINATION
Epithelial Cells (non renal): >10 /HPF — AB (ref 0–10)
WBC, UA: >30 /HPF — AB (ref 0–5)

## 2024-10-01 NOTE — Progress Notes (Signed)
 10/01/2024 8:34 AM   Paula Randolph Jun 13, 1961 [id number]  Referring provider: Bevely Doffing, FNP [phone] [place],  [place] [address]  Chief Complaint  Patient presents with   Establish Care    Cystocele without uterine prolapse     HPI: I was consulted to assist the patient suprapubic pressure or discomfort that comes and goes.  It is not daily.  She saw a physician in August and was referred for it.  She voids every 2-3 hours and gets up 2 and sometimes 3 times at night.  Flow was reasonable.  She is continent.  On further questioning she does feel vaginal bulging  She has had a hysterectomy.  In 2018 I performed a transvaginal vault suspension with cystocele repair and graft and sling for mild mixed incontinence and a grade 3 cystocele.  It appears she had a good postoperative course.  When I read the operative note she had more of a trapdoor deformity than a central defect.  Bowel movements normal.  No neurologic issues   PMH: Past Medical History:  Diagnosis Date   Allergy    Breast cancer (HCC) 2008   RT LUMPECTOMY and rad tx for DCIS   Cancer (HCC)    right breast    Dyslipidemia    Hypertension    Overweight(278.02)    Personal history of radiation therapy 2008   right breast DCIS   Primary osteoarthritis of both knees    Radiation 2008   RT BREAST CA   Right arm pain    Strain of lumbar spine    Trochanteric bursitis    Vitamin D  deficiency     Surgical History: Past Surgical History:  Procedure Laterality Date   ABDOMINAL HYSTERECTOMY     fibroids   APPENDECTOMY     BREAST LUMPECTOMY Right 2008   lumpectomy of calcifications in UOQ. DCIS   BREAST SURGERY Right 2008   partial   COLONOSCOPY N/A 05/10/2014   Procedure: COLONOSCOPY;  Surgeon: Lamar CHRISTELLA Hollingshead, MD;  Location: [facility];  Service: Endoscopy;  Laterality: N/A;  8:30 AM   COLONOSCOPY N/A 06/07/2024   Procedure: COLONOSCOPY;  Surgeon: Hollingshead Lamar CHRISTELLA, MD;  Location: [facility];  Service: Endoscopy;  Laterality: N/A;  8:15 am, asa 2   CYSTOCELE REPAIR N/A 12/13/2017   Procedure: ANTERIOR REPAIR (CYSTOCELE);  Surgeon: Gaston Hamilton, MD;  Location: [facility];  Service: Urology;  Laterality: N/A;   PUBOVAGINAL SLING N/A 12/13/2017   Procedure: CYSTOSCOPY CARLOYN GLADE;  Surgeon: Gaston Hamilton, MD;  Location: [facility];  Service: Urology;  Laterality: N/A;   right breast      VAGINAL PROLAPSE REPAIR N/A 12/13/2017   Procedure: VAGINAL VAULT SUSPENSION GRAFT;  Surgeon: Gaston Hamilton, MD;  Location: [facility];  Service: Urology;  Laterality: N/A;    Home Medications:  Allergies as of 10/01/2024       Reactions   Triamterene -hctz    ?caused skin rash        Medication List        Accurate as of October 01, 2024  8:34 AM. If you have any questions, ask your nurse or doctor.          amlodipine -olmesartan  10-20 MG tablet Commonly known as: AZOR  Take 1 tablet by mouth daily.   cetirizine  10 MG tablet Commonly known as: ZYRTEC  Take 1 tablet (10 mg total) by mouth daily.   UNABLE TO FIND Masectomy Bras and prosthesis x 6 months Dx:D05.11  UNABLE TO FIND Mastectomy bras   Vitamin D  (Ergocalciferol ) 1.25 MG (50000 UNIT) Caps capsule Commonly known as: DRISDOL  Take 1 capsule (50,000 Units total) by mouth every 7 (seven) days.        Allergies:  Allergies  Allergen Reactions   Triamterene -Hctz     ?caused skin rash    Family History: Family History  Problem Relation Age of Onset   Cancer Mother        breast    Breast cancer Mother 59   Diabetes Father    Diabetes Brother    Diabetes Sister     Social History:  reports that she has never smoked. She has never used smokeless tobacco. She reports that she does not drink alcohol and does not use drugs.  ROS:                                        Physical Exam: BP (!) 161/79   Pulse 80   Ht 5' 6 (1.676 m)   Wt 77.6 kg   BMI 27.61 kg/m    Constitutional:  Alert and oriented, No acute distress. HEENT: [place] AT, moist mucus membranes.  Trachea midline, no masses. Cardiovascular: No clubbing, cyanosis, or edema. Respiratory: Normal respiratory effort, no increased work of breathing. GI: Abdomen is soft, nontender, nondistended, no abdominal masses GU: On pelvic examination she had a grade 3 cystocele with significant central defect hinging over the proximal urethra to the level of the introitus.  I did not see or feel any mesh.  The vaginal cuff appeared to only descend approximately 2 cm.  She had good support posteriorly.  She did have mild atrophy and was minimally tender or sensitive Skin: No rashes, bruises or suspicious lesions. Lymph: No cervical or inguinal adenopathy. Neurologic: Grossly intact, no focal deficits, moving all 4 extremities. Psychiatric: Normal mood and affect.  Laboratory Data: Lab Results  Component Value Date   WBC 8.4 08/02/2024   HGB 10.0 (L) 08/02/2024   HCT 33.6 (L) 08/02/2024   MCV 94 08/02/2024   PLT 431 08/02/2024    Lab Results  Component Value Date   CREATININE 0.72 08/02/2024    No results found for: PSA  No results found for: TESTOSTERONE  Lab Results  Component Value Date   HGBA1C 4.5 (L) 03/19/2024    Urinalysis    Component Value Date/Time   COLORURINE DARK YELLOW 05/01/2019 1514   APPEARANCEUR Cloudy (A) 10/25/2023 1019   LABSPEC 1.020 05/01/2019 1514   PHURINE > OR = 8.5 (A) 05/01/2019 1514   GLUCOSEU Negative 10/25/2023 1019   HGBUR TRACE (A) 05/01/2019 1514   BILIRUBINUR Negative 10/25/2023 1019   KETONESUR trace (5) (A) 09/15/2022 0827   KETONESUR NEGATIVE 05/01/2019 1514   PROTEINUR Negative 10/25/2023 1019   PROTEINUR 1+ (A) 05/01/2019 1514   UROBILINOGEN 0.2 09/15/2022 0827   UROBILINOGEN 0.2 03/02/2012 1347   NITRITE Negative 10/25/2023 1019   NITRITE NEGATIVE 02/09/2019 2338   LEUKOCYTESUR Negative 10/25/2023 1019   LEUKOCYTESUR MODERATE (A)  02/09/2019 2338    Pertinent Imaging: Urine reviewed and sent for culture.  Chart reviewed  Assessment & Plan: Patient has a recurrent cystocele.  Patient had drawn.  A transvaginal robotic approach are good options.  She understands that her suprapubic pressure may or may not be related.  I did not order a CT scan today.  I will refer her  to urogynecology for reasons discussed.  I will we will call if culture positive.  I did not cystoscope the patient today.  Watchful waiting versus pessary also discussed and she would like to see the urogynecology team and pursue surgical options  1. Cystocele without uterine prolapse (Primary)  - Urinalysis, Complete   No follow-ups on file.  Paula DELENA Elizabeth, MD  [facility] [address], [address] [place], [place] [address] [phone]

## 2024-10-03 LAB — CULTURE, URINE COMPREHENSIVE

## 2024-10-04 ENCOUNTER — Ambulatory Visit: Payer: Self-pay

## 2024-10-04 DIAGNOSIS — N8110 Cystocele, unspecified: Secondary | ICD-10-CM

## 2024-10-04 MED ORDER — CEPHALEXIN 500 MG PO CAPS: 500.0000 mg | ORAL_CAPSULE | Freq: Three times a day (TID) | ORAL | 0 refills | Status: AC

## 2024-10-04 NOTE — Telephone Encounter (Signed)
 Appt made.

## 2024-10-04 NOTE — Telephone Encounter (Addendum)
 Pt was triaged and assessment questions asked. Call dropped d/t IT issues and was unable to bring assessment questions over into note. Pt returned call and was triaged by a different nurse, see assessment above.  Copied from CRM [id number]. Topic: Clinical - Red Word Triage >> Oct 04, 2024  2:01 PM Wess RAMAN wrote: Patient keeps breaking out due to blood pressure medication. Dermatologist stated she needed to see PCP and be check out

## 2024-10-04 NOTE — Telephone Encounter (Signed)
 FYI Only or Action Required?: FYI only for provider.  Patient was last seen in primary care on 08/02/2024 by Antonetta Rollene BRAVO, MD.  Called Nurse Triage reporting Medication Reaction.  Symptoms began several months ago.  Interventions attempted: Rest, hydration, or home remedies.  Symptoms are: gradually worsening.  Triage Disposition: See PCP Within 2 Weeks (overriding See Physician Within 24 Hours)  Patient/caregiver understands and will follow disposition?: Yes  Copied from CRM [id number]. Topic: Clinical - Red Word Triage >> Oct 04, 2024  2:01 PM Wess RAMAN wrote: Patient keeps breaking out due to blood pressure medication. Dermatologist stated she needed to see PCP and be check out Reason for Disposition  Taking new prescription medicine  (Exceptions: Finished taking new prescription antibiotic OR questions about flushing from niacin)  Answer Assessment - Initial Assessment Questions Pt taking amlodipine -olmesartan  and has broken into rash. Different shapes, dark, raised, spreading, itchy, arms legs back arm bottom breast, denies face. Was taking this medication years ago and had rash, began med again in August and has continued rash. Denies any SOB or other sxs  1. APPEARANCE of RASH: What does the rash look like? (e.g., spots, blisters, raised areas, skin peeling, scaly)     Dark colored; differed shapes, raised, itchy 2. SIZE: How big are the spots? (e.g., tip of pen, eraser, coin; inches, centimeters)     Quarter size at the largest, but varying in smaller sizes 3. LOCATION: Where is the rash located?     Arms legs back arm bottom breast. Denies face 4. COLOR: What color is the rash? (Note: It is difficult to assess rash color in people with darker-colored skin. When this situation occurs, simply ask the caller to describe what they see.)     Dark colored 5. ONSET: When did the rash begin?     Last year, has been going through this for a while 6. FEVER: Do you have  a fever? If Yes, ask: What is your temperature, how was it measured, and when did it start?     denies 7. ITCHING: Does the rash itch? If Yes, ask: How bad is the itch? (Scale 1-10; or mild, moderate, severe)     3/10 in cool weather, 10/10 in heat 8. CAUSE: What do you think is causing the rash?     Amlodipine /olmesartan  9. NEW MEDICINES: What new medicines are you taking? (e.g., name of antibiotic) When did you start taking this medication?.     Amlodipine /olmesartan  10. OTHER SYMPTOMS: Do you have any other symptoms? (e.g., sore throat, fever, joint pain)       denies  Protocols used: Rash - Widespread On Drugs-A-AH

## 2024-10-08 ENCOUNTER — Encounter: Payer: Self-pay | Admitting: Nurse Practitioner

## 2024-10-08 ENCOUNTER — Ambulatory Visit (INDEPENDENT_AMBULATORY_CARE_PROVIDER_SITE_OTHER): Payer: Self-pay | Admitting: Nurse Practitioner

## 2024-10-08 VITALS — BP 123/77 | HR 68 | Temp 97.5°F | Ht 66.0 in | Wt 170.0 lb

## 2024-10-08 DIAGNOSIS — L810 Postinflammatory hyperpigmentation: Secondary | ICD-10-CM | POA: Diagnosis not present

## 2024-10-08 DIAGNOSIS — L309 Dermatitis, unspecified: Secondary | ICD-10-CM | POA: Diagnosis not present

## 2024-10-08 DIAGNOSIS — Z23 Encounter for immunization: Secondary | ICD-10-CM

## 2024-10-08 NOTE — Progress Notes (Unsigned)
   Subjective:    Patient ID: Dailah Opperman, female    DOB: 08-18-1961, 63 y.o.   MRN: [id number]  HPI  Patient following up on BP and medications, she states she has been having a rash all over her body in different places except her face going on for over a year, dermatologist has done the biopsy of her skin Discussed the use of AI scribe software for clinical note transcription with the patient, who gave verbal consent to proceed.  History of Present Illness Angeliz Settlemyre is a 63 year old female with hypertension who presents with a persistent rash.  She has been experiencing a persistent rash for about a year, which is widespread across her body, including her arms and face. The rash sometimes forms hard nodules that can become sore and is associated with itching. She attempts to manage the itching by using a wet bath cloth to cool the affected areas.  She has a history of hypertension and was previously on a blood pressure medication that she believes initially caused the rash. This medication was changed in May to a combination of amlodipine  and olmesartan , but the rash has persisted. Despite the change in medication, there has been no improvement in the rash.  She has been using cetirizine  (Zyrtec ) as an antihistamine and a generic Elocon cream, a medium potency steroid cream, for her face. These treatments have not significantly improved her condition. No specific triggers have been noted.   No fevers, bruising, or bleeding from the gums. No chest pain, shortness of breath, or wheezing, although she does have a slight cough.  Her dermatologist initially suspected the blood pressure medication as the cause of the rash, but the rash has continued despite the medication change. A biopsy was performed, but the results are not available in the current records. An elevated ESR was noted, and there was a mention of a possible autoimmune condition during discussions.  She has a history of  ductal carcinoma (breast cancer) but is not currently on any treatment for it.  Social History   Tobacco Use   Smoking status: Never   Smokeless tobacco: Never  Vaping Use   Vaping status: Never Used  Substance Use Topics   Alcohol use: No   Drug use: No         Objective:   Physical Exam        Assessment & Plan:  1. Immunization due (Primary)  - Flu vaccine trivalent PF, 6mos and older(Flulaval,Afluria,Fluarix,Fluzone)  2. Dermatitis due to unknown cause Chronic systemic rash with hyperpigmented and hypopigmented areas, unresponsive to topical treatments. Possible systemic or autoimmune etiology suggested by elevated ESR. - Obtain biopsy report from dermatologist for review. - Consider referral to a specialist for further evaluation of potential systemic or autoimmune causes. - Continue current topical treatments for symptom management. - Advise against using hot water  on affected areas to prevent skin drying. - Ambulatory referral to Rheumatology .Hypertension well-controlled with current medication. Rash persists despite change in blood pressure medication, indicating it is not the cause. - Continue current blood pressure medication regimen.  3. Postinflammatory hyperpigmentation Refer for evaluation of possible systemic/inflammatory cause of rash.  - Ambulatory referral to Rheumatology  Warning signs reviewed.  Follow-up with PCP if any new or worsening symptoms.

## 2024-10-09 ENCOUNTER — Encounter: Payer: Self-pay | Admitting: Nurse Practitioner

## 2024-12-03 ENCOUNTER — Ambulatory Visit: Payer: Self-pay

## 2024-12-03 NOTE — Telephone Encounter (Signed)
Noted patient scheduled

## 2024-12-03 NOTE — Telephone Encounter (Signed)
 FYI Only or Action Required?: FYI only for provider: appointment scheduled on 12/10.  Patient was last seen in primary care on 10/08/2024 by Mauro Elveria BROCKS, NP.  Called Nurse Triage reporting Urinary Frequency.  Symptoms began several days ago.  Interventions attempted: Rest, hydration, or home remedies.  Symptoms are: unchanged.  Triage Disposition: See Physician Within 24 Hours  Patient/caregiver understands and will follow disposition?: Yes          Message from [facility] sent at 12/03/2024  9:57 AM EST  Summary: odor in urine -urinate frequently   Reason for Triage: -started a couple of days ago -pain urinate does not happen every time -odor in urine -urinate frequently -pain in lower abdomen/ more like full bladder feeling      Reason for Disposition  Urinating more frequently than usual (i.e., frequency) OR new-onset of the feeling of an urgent need to urinate (i.e., urgency)  Answer Assessment - Initial Assessment Questions 1. SYMPTOM: What's the main symptom you're concerned about? (e.g., frequency, incontinence)     Odor, frequency, feelings of full bladder.  2. ONSET: When did the symptoms start?     X 5 days  3. PAIN: Is there any pain? If Yes, ask: How bad is it? (Scale: 1-10; mild, moderate, severe)     Intermittent pain-moderate, pain is not present with each urination.  4. CAUSE: What do you think is causing the symptoms?     Possible UTI  5. OTHER SYMPTOMS: Do you have any other symptoms? (e.g., blood in urine, fever, flank pain, pain with urination)  No    Patient called in to triage with complaints of odor, pain, and urinary frequency. This has been ongoing for months, however the past 5 days these symptoms have worsened.  The patient stated she needs bladder surgery, per her urologist; however she stated trying to be seen by that office is hard due to lack of appointment availability.  Appointment scheduled for further  evaluation per the soonest appointment in Epic for 12/10; and patient agrees with the plan of care, and will reach out if symptoms worsen or persist.  Protocols used: Urinary Symptoms-A-AH

## 2024-12-05 ENCOUNTER — Ambulatory Visit: Payer: Self-pay

## 2024-12-05 VITALS — BP 111/73 | HR 85 | Resp 16 | Ht 66.0 in | Wt 170.1 lb

## 2024-12-05 DIAGNOSIS — N8110 Cystocele, unspecified: Secondary | ICD-10-CM | POA: Diagnosis not present

## 2024-12-05 DIAGNOSIS — R300 Dysuria: Secondary | ICD-10-CM

## 2024-12-05 MED ORDER — CEPHALEXIN 500 MG PO CAPS: 500.0000 mg | ORAL_CAPSULE | Freq: Three times a day (TID) | ORAL | 0 refills | Status: AC

## 2024-12-05 NOTE — Progress Notes (Signed)
 Established Patient Office Visit  Subjective   Patient ID: Paula Randolph, female    DOB: February 01, 1961  Age: 63 y.o. MRN: [id number]  Chief Complaint  Patient presents with   Urinary Tract Infection    Pt complains of frequency, burning, and odor in urination x1 week. Denies fever     HPI Discussed the use of AI scribe software for clinical note transcription with the patient, who gave verbal consent to proceed.  History of Present Illness    Paula Randolph is a 63 year old female who presents with symptoms suggestive of a urinary tract infection (UTI).  Urinary symptoms - Increased urinary frequency, particularly during winter months - No dysuria - No tenderness  Bladder dysfunction - History of bladder prolapse with inadequate expansion noted on previous consultation - No follow-up communication received regarding bladder issue  Gastrointestinal function - Regular bowel movements     Patient Active Problem List   Diagnosis Date Noted   Normocytic anemia 05/02/2024   Anemia 04/01/2024   Hyperpigmented skin lesion 05/17/2023   CT (claw toe), left 07/22/2020   Annual physical exam 08/11/2015   Vitamin D  deficiency 02/02/2010   Overweight (BMI 25.0-29.9) 04/20/2009   Dyslipidemia 01/30/2009   Allergic rhinitis 01/30/2009   Ductal carcinoma in situ (DCIS) of right breast 04/12/2008   Essential hypertension 01/05/2008      ROS    Objective:     BP 111/73   Pulse 85   Resp 16   Ht 5' 6 (1.676 m)   Wt 170 lb 1.9 oz (77.2 kg)   SpO2 100%   BMI 27.46 kg/m  BP Readings from Last 3 Encounters:  12/05/24 111/73  10/08/24 123/77  10/01/24 (!) 161/79   Wt Readings from Last 3 Encounters:  12/05/24 170 lb 1.9 oz (77.2 kg)  10/08/24 170 lb (77.1 kg)  10/01/24 171 lb 1.3 oz (77.6 kg)     Physical Exam Vitals and nursing note reviewed.  Constitutional:      Appearance: Normal appearance.  HENT:     Head: Normocephalic.  Eyes:     Extraocular Movements:  Extraocular movements intact.     Pupils: Pupils are equal, round, and reactive to light.  Cardiovascular:     Rate and Rhythm: Normal rate and regular rhythm.  Pulmonary:     Effort: Pulmonary effort is normal.     Breath sounds: Normal breath sounds.  Abdominal:     General: Bowel sounds are normal.     Palpations: Abdomen is soft.     Tenderness: There is no abdominal tenderness. There is no right CVA tenderness or left CVA tenderness.  Musculoskeletal:     Cervical back: Normal range of motion and neck supple.  Neurological:     Mental Status: She is alert and oriented to person, place, and time.  Psychiatric:        Mood and Affect: Mood normal.        Thought Content: Thought content normal.      No results found for any visits on 12/05/24.    The 10-year ASCVD risk score (Arnett DK, et al., 2019) is: 5.7%    Assessment & Plan:   Problem List Items Addressed This Visit   None Visit Diagnoses       Dysuria    -  Primary   Symptoms suggest UTI. Previous culture indicated antibiotic necessity. - Prescribed Keflex  based on previous culture results.   Relevant Medications   cephALEXin  (KEFLEX ) 500 MG  capsule   Other Relevant Orders   Urinalysis     Cystocele without uterine prolapse       Bladder prolapse with incomplete expansion. Previous referral not followed up. - Updated referral to urogynecologist for evaluation and management.   Relevant Orders   Ambulatory referral to Urogynecology       Return for as needed.    Leita Longs, FNP

## 2025-01-02 ENCOUNTER — Ambulatory Visit (HOSPITAL_COMMUNITY)
Admission: RE | Admit: 2025-01-02 | Discharge: 2025-01-02 | Disposition: A | Discharge status: Home / Self Care | Source: Ambulatory Visit | Attending: Family Medicine | Admitting: Family Medicine

## 2025-01-02 ENCOUNTER — Ambulatory Visit: Admitting: Family Medicine

## 2025-01-02 ENCOUNTER — Encounter: Payer: Self-pay | Admitting: Family Medicine

## 2025-01-02 ENCOUNTER — Other Ambulatory Visit (HOSPITAL_COMMUNITY): Payer: Self-pay | Admitting: Family Medicine

## 2025-01-02 VITALS — BP 122/74 | HR 85 | Resp 16 | Ht 66.0 in | Wt 169.1 lb

## 2025-01-02 DIAGNOSIS — Z0000 Encounter for general adult medical examination without abnormal findings: Secondary | ICD-10-CM

## 2025-01-02 DIAGNOSIS — L819 Disorder of pigmentation, unspecified: Secondary | ICD-10-CM

## 2025-01-02 DIAGNOSIS — N8110 Cystocele, unspecified: Secondary | ICD-10-CM | POA: Diagnosis not present

## 2025-01-02 DIAGNOSIS — R059 Cough, unspecified: Secondary | ICD-10-CM | POA: Insufficient documentation

## 2025-01-02 DIAGNOSIS — Z853 Personal history of malignant neoplasm of breast: Secondary | ICD-10-CM | POA: Diagnosis not present

## 2025-01-02 DIAGNOSIS — D0511 Intraductal carcinoma in situ of right breast: Secondary | ICD-10-CM

## 2025-01-02 DIAGNOSIS — Z780 Asymptomatic menopausal state: Secondary | ICD-10-CM | POA: Diagnosis not present

## 2025-01-02 DIAGNOSIS — R052 Subacute cough: Secondary | ICD-10-CM | POA: Insufficient documentation

## 2025-01-02 DIAGNOSIS — Z1231 Encounter for screening mammogram for malignant neoplasm of breast: Secondary | ICD-10-CM

## 2025-01-02 MED ORDER — BENZONATATE 100 MG PO CAPS: 100.0000 mg | ORAL_CAPSULE | Freq: Two times a day (BID) | ORAL | 0 refills | Status: AC | PRN

## 2025-01-02 MED ORDER — UNABLE TO FIND: 0 refills | Status: AC

## 2025-01-02 MED ORDER — SULFAMETHOXAZOLE-TRIMETHOPRIM 800-160 MG PO TABS: 1.0000 | ORAL_TABLET | Freq: Two times a day (BID) | ORAL | 0 refills | Status: AC

## 2025-01-02 NOTE — Progress Notes (Addendum)
" ° ° °  Paula Randolph     MRN: [id number]      DOB: Dec 09, 1961  Chief Complaint  Patient presents with   Annual Exam    Cpe     HPI: Patient is in for annual physical exam. 1 month h/o cough at times productive of yellow sputum.no fever or chills Persistent rash, sometimes itches Needs recurrent cystocoele operated on  Recent labs,  are reviewed. Immunization is reviewed ,  PE:  BP 122/74   Pulse 85   Resp 16   Ht 5' 6 (1.676 m)   Wt 169 lb 1.3 oz (76.7 kg)   SpO2 98%   BMI 27.29 kg/m   Pleasant  female, alert and oriented x 3, in no cardio-pulmonary distress. Afebrile. HEENT No facial trauma or asymetry. Sinuses non tender.  Extra occullar muscles intact.. External ears normal, . Neck: supple, no adenopathy,JVD or thyromegaly.No bruits.  Chest: Adequate air entry , few crackles, no wheezes. Non tender to palpation  Cardiovascular system; Heart sounds normal,  S1 and  S2 ,no S3.  No murmur, or thrill. Apical beat not displaced Peripheral pulses normal.  Abdomen: Soft, non tender, no organomegaly or masses. No bruits. Bowel sounds normal. No guarding, tenderness or rebound.     Musculoskeletal exam: Full ROM of spine, hips , shoulders and knees. No deformity ,swelling or crepitus noted. No muscle wasting or atrophy.   Neurologic: Cranial nerves 2 to 12 intact. Power, tone ,sensation and reflexes normal throughout. No disturbance in gait. No tremor.  Skin: Intact, hyperpigmented macular skin lesions, multiple on trunk, upper and lower exrtremities Psych; Normal mood and affect. Judgement and concentration normal   Assessment & Plan:  Annual physical exam Annual exam as documented.  Immunization and cancer screening needs are specifically addressed at this visit.   Cough Tessalon  perle and sepra prescribed , cxr ordered  Post-menopause Needs dexa  History of right breast cancer Mastectomy bras ordered  Hyperpigmented skin lesion No  change persistent lesions on trunk and extremities, refer dermatology 2nd opinion  Cystocele without uterine prolapse Urology eval in 09/2024, recommended referral to urogyne , pt has hysterectomy  "

## 2025-01-02 NOTE — Patient Instructions (Addendum)
 F/U in 6 months  Nurse visit for Pneumonia vaccine in 1 month  Annual with Dr Tobie in 1 year  Please schedule dexa and mammogram at checkout,Jan 15 or after   Labs today already ordered  CXR today at hospital please  Nurse pls order mastectomy bras at 2nd to [facility]  Need TdAP and Covid vaccines at your pharmacy  Tessalon  perles and  antibiotic septra  are prescribed for cough  You are being referred to Urogyne and to dermatology  Thanks for choosing [facility], we consider it a privelige to serve you.

## 2025-01-03 ENCOUNTER — Other Ambulatory Visit: Payer: Self-pay

## 2025-01-03 DIAGNOSIS — E785 Hyperlipidemia, unspecified: Secondary | ICD-10-CM

## 2025-01-03 DIAGNOSIS — I10 Essential (primary) hypertension: Secondary | ICD-10-CM

## 2025-01-03 DIAGNOSIS — D649 Anemia, unspecified: Secondary | ICD-10-CM

## 2025-01-03 LAB — CBC WITH DIFFERENTIAL/PLATELET
Basophils Absolute: 0.1 x10E3/uL (ref 0.0–0.2)
Basos: 1 %
EOS (ABSOLUTE): 0.5 x10E3/uL — ABNORMAL HIGH (ref 0.0–0.4)
Eos: 6 %
Hematocrit: 32.9 % — ABNORMAL LOW (ref 34.0–46.6)
Hemoglobin: 9.8 g/dL — ABNORMAL LOW (ref 11.1–15.9)
Immature Grans (Abs): 0 x10E3/uL (ref 0.0–0.1)
Immature Granulocytes: 0 %
Lymphocytes Absolute: 2.2 x10E3/uL (ref 0.7–3.1)
Lymphs: 28 %
MCH: 27.4 pg (ref 26.6–33.0)
MCHC: 29.8 g/dL — ABNORMAL LOW (ref 31.5–35.7)
MCV: 92 fL (ref 79–97)
Monocytes Absolute: 0.7 x10E3/uL (ref 0.1–0.9)
Monocytes: 8 %
Neutrophils Absolute: 4.5 x10E3/uL (ref 1.4–7.0)
Neutrophils: 57 %
Platelets: 497 x10E3/uL — ABNORMAL HIGH (ref 150–450)
RBC: 3.58 x10E6/uL — ABNORMAL LOW (ref 3.77–5.28)
RDW: 11.8 % (ref 11.7–15.4)
WBC: 8 x10E3/uL (ref 3.4–10.8)

## 2025-01-03 LAB — CMP14+EGFR
ALT: 16 IU/L (ref 0–32)
AST: 18 IU/L (ref 0–40)
Albumin: 4.5 g/dL (ref 3.9–4.9)
Alkaline Phosphatase: 136 IU/L — ABNORMAL HIGH (ref 49–135)
BUN/Creatinine Ratio: 23 (ref 12–28)
BUN: 15 mg/dL (ref 8–27)
Bilirubin Total: 0.3 mg/dL (ref 0.0–1.2)
CO2: 22 mmol/L (ref 20–29)
Calcium: 10.2 mg/dL (ref 8.7–10.3)
Chloride: 105 mmol/L (ref 96–106)
Creatinine, Ser: 0.66 mg/dL (ref 0.57–1.00)
Globulin, Total: 3.1 g/dL (ref 1.5–4.5)
Glucose: 93 mg/dL (ref 70–99)
Potassium: 4.2 mmol/L (ref 3.5–5.2)
Sodium: 140 mmol/L (ref 134–144)
Total Protein: 7.6 g/dL (ref 6.0–8.5)
eGFR: 98 mL/min/1.73

## 2025-01-03 LAB — LIPID PANEL
Chol/HDL Ratio: 3.1 ratio (ref 0.0–4.4)
Cholesterol, Total: 236 mg/dL — ABNORMAL HIGH (ref 100–199)
HDL: 77 mg/dL
LDL Chol Calc (NIH): 146 mg/dL — ABNORMAL HIGH (ref 0–99)
Triglycerides: 77 mg/dL (ref 0–149)
VLDL Cholesterol Cal: 13 mg/dL (ref 5–40)

## 2025-01-03 LAB — TSH: TSH: 1.77 u[IU]/mL (ref 0.450–4.500)

## 2025-01-03 LAB — VITAMIN D 25 HYDROXY (VIT D DEFICIENCY, FRACTURES): Vit D, 25-Hydroxy: 42.9 ng/mL (ref 30.0–100.0)

## 2025-01-06 DIAGNOSIS — N8110 Cystocele, unspecified: Secondary | ICD-10-CM | POA: Insufficient documentation

## 2025-01-06 DIAGNOSIS — Z780 Asymptomatic menopausal state: Secondary | ICD-10-CM | POA: Insufficient documentation

## 2025-01-06 DIAGNOSIS — Z853 Personal history of malignant neoplasm of breast: Secondary | ICD-10-CM | POA: Insufficient documentation

## 2025-01-06 NOTE — Assessment & Plan Note (Signed)
 Urology eval in 09/2024, recommended referral to urogyne , pt has hysterectomy

## 2025-01-06 NOTE — Assessment & Plan Note (Signed)
 Tessalon  perle and sepra prescribed , cxr ordered

## 2025-01-06 NOTE — Assessment & Plan Note (Signed)
 Annual exam as documented. . Immunization and cancer screening needs are specifically addressed at this visit.

## 2025-01-06 NOTE — Assessment & Plan Note (Signed)
 No change persistent lesions on trunk and extremities, refer dermatology 2nd opinion

## 2025-01-06 NOTE — Assessment & Plan Note (Signed)
Mastectomy bras ordered.

## 2025-01-06 NOTE — Assessment & Plan Note (Signed)
Needs dexa

## 2025-01-07 LAB — IRON: Iron: 68 ug/dL (ref 27–139)

## 2025-01-07 LAB — SPECIMEN STATUS REPORT

## 2025-01-10 ENCOUNTER — Ambulatory Visit: Payer: Self-pay | Admitting: Family Medicine

## 2025-01-18 NOTE — Telephone Encounter (Signed)
 Pt sent message stating that the Dermatology office was not accepting new pts When I reviewed the record states that pt refused service Pals call and explain this to her and provide her with tele number to call back and schedule an appointment , thanks

## 2025-01-19 NOTE — Telephone Encounter (Signed)
 Please follow up on this and call pt regarding the Dermatology appointment,see the note  I sent 1/23, PLS ask me for any clarification needed before you call the patient thanks

## 2025-01-24 ENCOUNTER — Other Ambulatory Visit: Payer: Self-pay

## 2025-01-24 DIAGNOSIS — L819 Disorder of pigmentation, unspecified: Secondary | ICD-10-CM

## 2025-01-28 ENCOUNTER — Ambulatory Visit: Payer: Self-pay

## 2025-02-06 ENCOUNTER — Ambulatory Visit: Payer: Self-pay

## 2025-02-20 ENCOUNTER — Ambulatory Visit: Admitting: Family Medicine

## 2025-02-20 ENCOUNTER — Ambulatory Visit

## 2025-03-19 ENCOUNTER — Ambulatory Visit: Payer: Self-pay | Admitting: Rheumatology

## 2025-03-27 ENCOUNTER — Ambulatory Visit (HOSPITAL_COMMUNITY)

## 2025-03-27 ENCOUNTER — Other Ambulatory Visit (HOSPITAL_COMMUNITY)

## 2025-05-02 ENCOUNTER — Ambulatory Visit: Payer: Self-pay | Admitting: Rheumatology

## 2025-07-03 ENCOUNTER — Ambulatory Visit: Payer: Self-pay | Admitting: Family Medicine
